# Patient Record
Sex: Female | Born: 1985 | Hispanic: Yes | Marital: Married | Smoking: Never smoker
Health system: Southern US, Community
[De-identification: ages and names within clinical notes are randomized; demographics above are authoritative.]

## PROBLEM LIST (undated history)

## (undated) ENCOUNTER — Inpatient Hospital Stay (HOSPITAL_COMMUNITY): Payer: Self-pay

## (undated) DIAGNOSIS — R51 Headache: Secondary | ICD-10-CM

## (undated) DIAGNOSIS — K219 Gastro-esophageal reflux disease without esophagitis: Secondary | ICD-10-CM

## (undated) DIAGNOSIS — N809 Endometriosis, unspecified: Secondary | ICD-10-CM

## (undated) DIAGNOSIS — IMO0001 Reserved for inherently not codable concepts without codable children: Secondary | ICD-10-CM

## (undated) DIAGNOSIS — Z862 Personal history of diseases of the blood and blood-forming organs and certain disorders involving the immune mechanism: Secondary | ICD-10-CM

## (undated) DIAGNOSIS — R112 Nausea with vomiting, unspecified: Secondary | ICD-10-CM

## (undated) DIAGNOSIS — F419 Anxiety disorder, unspecified: Secondary | ICD-10-CM

## (undated) DIAGNOSIS — Z9889 Other specified postprocedural states: Secondary | ICD-10-CM

## (undated) DIAGNOSIS — K5900 Constipation, unspecified: Secondary | ICD-10-CM

## (undated) DIAGNOSIS — D759 Disease of blood and blood-forming organs, unspecified: Secondary | ICD-10-CM

## (undated) DIAGNOSIS — K2970 Gastritis, unspecified, without bleeding: Secondary | ICD-10-CM

## (undated) DIAGNOSIS — N390 Urinary tract infection, site not specified: Secondary | ICD-10-CM

## (undated) DIAGNOSIS — T4145XA Adverse effect of unspecified anesthetic, initial encounter: Secondary | ICD-10-CM

## (undated) DIAGNOSIS — J45909 Unspecified asthma, uncomplicated: Secondary | ICD-10-CM

## (undated) DIAGNOSIS — T8859XA Other complications of anesthesia, initial encounter: Secondary | ICD-10-CM

## (undated) DIAGNOSIS — D219 Benign neoplasm of connective and other soft tissue, unspecified: Secondary | ICD-10-CM

## (undated) HISTORY — PX: INDUCED ABORTION: SHX677

## (undated) HISTORY — PX: DILATION AND CURETTAGE OF UTERUS: SHX78

---

## 2006-12-08 ENCOUNTER — Emergency Department (HOSPITAL_COMMUNITY): Admission: EM | Admit: 2006-12-08 | Discharge: 2006-12-08 | Payer: Self-pay | Admitting: Emergency Medicine

## 2010-11-21 LAB — I-STAT 8, (EC8 V) (CONVERTED LAB)
Acid-Base Excess: 2
BUN: 8
Bicarbonate: 26.6 — ABNORMAL HIGH
Chloride: 104
Glucose, Bld: 110 — ABNORMAL HIGH
HCT: 43
Hemoglobin: 14.6
Operator id: 288831
Potassium: 4.3
Sodium: 138
TCO2: 28
pCO2, Ven: 41.5 — ABNORMAL LOW
pH, Ven: 7.414 — ABNORMAL HIGH

## 2010-11-21 LAB — DIFFERENTIAL
Basophils Absolute: 0.1
Basophils Relative: 1
Lymphocytes Relative: 7 — ABNORMAL LOW
Neutro Abs: 15.9 — ABNORMAL HIGH
Neutrophils Relative %: 90 — ABNORMAL HIGH

## 2010-11-21 LAB — CBC
MCV: 90.4
Platelets: 280
RBC: 4.53
RDW: 12

## 2010-11-21 LAB — D-DIMER, QUANTITATIVE: D-Dimer, Quant: <0.22

## 2010-11-21 LAB — POCT I-STAT CREATININE
Creatinine, Ser: 0.6
Operator id: 288831

## 2011-12-10 ENCOUNTER — Emergency Department (HOSPITAL_BASED_OUTPATIENT_CLINIC_OR_DEPARTMENT_OTHER)
Admission: EM | Admit: 2011-12-10 | Discharge: 2011-12-11 | Disposition: A | Discharge status: Home / Self Care | Payer: Self-pay | Attending: Emergency Medicine | Admitting: Emergency Medicine

## 2011-12-10 ENCOUNTER — Encounter (HOSPITAL_BASED_OUTPATIENT_CLINIC_OR_DEPARTMENT_OTHER): Payer: Self-pay | Admitting: *Deleted

## 2011-12-10 ENCOUNTER — Emergency Department (HOSPITAL_BASED_OUTPATIENT_CLINIC_OR_DEPARTMENT_OTHER): Payer: Self-pay

## 2011-12-10 DIAGNOSIS — K2970 Gastritis, unspecified, without bleeding: Secondary | ICD-10-CM | POA: Insufficient documentation

## 2011-12-10 DIAGNOSIS — R21 Rash and other nonspecific skin eruption: Secondary | ICD-10-CM | POA: Insufficient documentation

## 2011-12-10 DIAGNOSIS — K859 Acute pancreatitis without necrosis or infection, unspecified: Secondary | ICD-10-CM | POA: Insufficient documentation

## 2011-12-10 DIAGNOSIS — R197 Diarrhea, unspecified: Secondary | ICD-10-CM | POA: Insufficient documentation

## 2011-12-10 DIAGNOSIS — Z87440 Personal history of urinary (tract) infections: Secondary | ICD-10-CM | POA: Insufficient documentation

## 2011-12-10 DIAGNOSIS — F411 Generalized anxiety disorder: Secondary | ICD-10-CM | POA: Insufficient documentation

## 2011-12-10 DIAGNOSIS — K219 Gastro-esophageal reflux disease without esophagitis: Secondary | ICD-10-CM | POA: Insufficient documentation

## 2011-12-10 DIAGNOSIS — R110 Nausea: Secondary | ICD-10-CM | POA: Insufficient documentation

## 2011-12-10 DIAGNOSIS — K5900 Constipation, unspecified: Secondary | ICD-10-CM | POA: Insufficient documentation

## 2011-12-10 HISTORY — DX: Reserved for inherently not codable concepts without codable children: IMO0001

## 2011-12-10 HISTORY — DX: Constipation, unspecified: K59.00

## 2011-12-10 HISTORY — DX: Gastro-esophageal reflux disease without esophagitis: K21.9

## 2011-12-10 HISTORY — DX: Gastritis, unspecified, without bleeding: K29.70

## 2011-12-10 HISTORY — DX: Anxiety disorder, unspecified: F41.9

## 2011-12-10 HISTORY — DX: Urinary tract infection, site not specified: N39.0

## 2011-12-10 LAB — CBC WITH DIFFERENTIAL/PLATELET
Basophils Absolute: 0 10*3/uL (ref 0.0–0.1)
Basophils Relative: 0 % (ref 0–1)
Eosinophils Absolute: 0.3 10*3/uL (ref 0.0–0.7)
Eosinophils Relative: 3 % (ref 0–5)
HCT: 39.6 % (ref 36.0–46.0)
Hemoglobin: 13.6 g/dL (ref 12.0–15.0)
MCV: 90.4 fL (ref 78.0–100.0)
RBC: 4.38 MIL/uL (ref 3.87–5.11)

## 2011-12-10 LAB — URINALYSIS, ROUTINE W REFLEX MICROSCOPIC
Glucose, UA: NEGATIVE mg/dL
Hgb urine dipstick: NEGATIVE
Leukocytes, UA: NEGATIVE
Nitrite: NEGATIVE
Protein, ur: NEGATIVE mg/dL
Specific Gravity, Urine: 1.023 (ref 1.005–1.030)
Urobilinogen, UA: 0.2 mg/dL (ref 0.0–1.0)

## 2011-12-10 LAB — COMPREHENSIVE METABOLIC PANEL
AST: 17 U/L (ref 0–37)
Albumin: 4 g/dL (ref 3.5–5.2)
Chloride: 100 mEq/L (ref 96–112)
GFR calc Af Amer: >90 mL/min (ref >90)
Total Bilirubin: 0.4 mg/dL (ref 0.3–1.2)

## 2011-12-10 LAB — PREGNANCY, URINE: Preg Test, Ur: NEGATIVE

## 2011-12-10 LAB — LIPASE, BLOOD: Lipase: 195 U/L — ABNORMAL HIGH (ref 11–59)

## 2011-12-10 MED ORDER — ONDANSETRON HCL 4 MG/2ML IJ SOLN
4.0000 mg | Freq: Once | INTRAMUSCULAR | Status: AC
  Administered 2011-12-10: 4 mg via INTRAVENOUS
  Filled 2011-12-10: qty 2

## 2011-12-10 MED ORDER — SODIUM CHLORIDE 0.9 % IV BOLUS (SEPSIS)
1000.0000 mL | Freq: Once | INTRAVENOUS | Status: AC
  Administered 2011-12-10: 1000 mL via INTRAVENOUS

## 2011-12-10 MED ORDER — HYDROMORPHONE HCL PF 1 MG/ML IJ SOLN
1.0000 mg | Freq: Once | INTRAMUSCULAR | Status: DC
  Filled 2011-12-10: qty 1

## 2011-12-10 NOTE — ED Provider Notes (Addendum)
History     CSN: [id number]  Arrival date & time 12/10/11  1731   First MD Initiated Contact with Patient 12/10/11 1857      Chief Complaint  Patient presents with  . Abdominal Pain    (Consider location/radiation/quality/duration/timing/severity/associated sxs/prior treatment) HPI Patient complaining of intermittent abdominal pain which became constant within the past 24 hours. She describes it as in the epigastric region radiating to the back and to the right side. She had some weight loss with this in the spring. She states that she has had it for a total of about 9-12 months. She denies recent weight loss. She has not had vomiting with this although it has sometimes increased her pain to eat. She does not drink alcohol. She has no history of gallstones. No fever, chills, dyspnea, UTI symptoms, hematuria, or history of STD. Patient states that she had some type of stomach problem as an infant that included constipation and has had intermittent episodes of constipation. She denies any current problems with constipation or diarrhea. She states that she had gastritis which she received IV medications for several years ago. Past Medical History  Diagnosis Date  . Reflux   . UTI (lower urinary tract infection)   . Anxiety   . Constipation   . Gastritis     History reviewed. No pertinent past surgical history.  No family history on file.  History  Substance Use Topics  . Smoking status: Never Smoker   . Smokeless tobacco: Not on file  . Alcohol Use: No    OB History    Grav Para Term Preterm Abortions TAB SAB Ect Mult Living                  Review of Systems  Constitutional: Negative for fever and unexpected weight change.  Eyes: Negative for discharge.  Respiratory: Negative for cough, choking and shortness of breath.   Cardiovascular: Negative for chest pain.  Gastrointestinal: Positive for abdominal pain. Negative for nausea, vomiting, diarrhea, constipation,  abdominal distention, anal bleeding and rectal pain.  Genitourinary: Positive for flank pain. Negative for dysuria, urgency, frequency, hematuria, decreased urine volume, vaginal bleeding, vaginal discharge, difficulty urinating, genital sores, vaginal pain, menstrual problem and pelvic pain.  Musculoskeletal: Negative for arthralgias and gait problem.  Skin: Positive for rash.  Neurological: Negative for dizziness.  Hematological: Negative for adenopathy.  Psychiatric/Behavioral: Negative for behavioral problems and confusion.    Allergies  Review of patient's allergies indicates no known allergies.  Home Medications  No current outpatient prescriptions on file.  BP 112/74  Pulse 73  Temp 97.1 F (36.2 C) (Oral)  Resp 20  Ht 5\' 4"  (1.626 m)  Wt 138 lb (62.596 kg)  BMI 23.69 kg/m2  SpO2 100%  LMP 11/17/2011  Physical Exam  Nursing note and vitals reviewed. Constitutional: She appears well-developed and well-nourished.  HENT:  Head: Normocephalic and atraumatic.  Eyes: Conjunctivae normal and EOM are normal. Pupils are equal, round, and reactive to light.  Neck: Normal range of motion. Neck supple.  Cardiovascular: Normal rate, regular rhythm, normal heart sounds and intact distal pulses.   Pulmonary/Chest: Effort normal and breath sounds normal.  Abdominal: Soft. Bowel sounds are normal. There is tenderness.       Mild epigastric tenderness with palpation.  Musculoskeletal: Normal range of motion.  Neurological: She is alert.  Skin: Skin is warm and dry.  Psychiatric: She has a normal mood and affect. Thought content normal.    ED Course  Procedures (including critical care time)  Labs Reviewed  LIPASE, BLOOD - Abnormal; Notable for the following:    Lipase 195 (*)     All other components within normal limits  URINALYSIS, ROUTINE W REFLEX MICROSCOPIC  PREGNANCY, URINE  CBC WITH DIFFERENTIAL  COMPREHENSIVE METABOLIC PANEL   US Abdomen Complete  12/10/2011   *RADIOLOGY REPORT*  Clinical Data:  Abdominal pain.  Nausea.  COMPLETE ABDOMINAL ULTRASOUND  Comparison:  None.  Findings:  Gallbladder:  No shadowing gallstones or echogenic sludge.  No gallbladder wall thickening or pericholecystic fluid.  No sonographic Murphy's sign according to the ultrasound technologist. Wall thickness is 1.8 mm, within normal limits.  Common bile duct:  Normal in caliber. No biliary ductal dilation. The maximal diameters 2.0 mm, within normal limits.  Liver:  No focal lesion identified.  Within normal limits in parenchymal echogenicity.  IVC:  Limited visualization due to bowel gas.  Pancreas:  Limited visualization due to bowel gas.  Spleen:  Normal size and echotexture without focal parenchymal abnormality.  The maximal length is 7.4 cm, within normal limits.  Right Kidney:  No hydronephrosis.  Well-preserved cortex.  Normal size and parenchymal echotexture without focal abnormalities. The maximal length is 10.0 cm, within normal limits.  Left Kidney:  No hydronephrosis.  Well-preserved cortex.  Normal size and parenchymal echotexture without focal abnormalities. The maximal length is 12.0 cm, within normal limits.  Abdominal aorta:  Limited visualization due to bowel gas.  IMPRESSION: Negative abdominal ultrasound.   Original Report Authenticated By: Jamesetta Orleans. MATTERN, M.D.      No diagnosis found.    Patient with elevated lipase and epigastric pain consistent with pancreatitis. She does not drink alcohol and does not have any gallstones noted on ultrasound. She has had this over several months up to one year. The pain had increased over the past several days but has dissipated here with pain medicine. She has been taking by mouth without difficulty. She's given by mouth here but has not caused her pain. She is tolerating oral fluids. I am currently consult saying gastroenterology to assure close followup.       Hilario Quarry, MD 12/10/11 2352  Patient's care  discussed with Dr. Loreta Ave. Unfortunately Dr. Loreta Ave is not on call for [facility]. However, the patient's care was discussed with her and she will contact [facility] to arrange followup in the morning. She voiced agreement that the patient should have followup tomorrow given her elevated lipase. She was given the phone number to access the patient had [phone]. She asked that the patient be instructed to call  gastroenterology at [phone] if she has not contacted. This is discussed with the patient her family and she and the family voiced understanding and agreement with this plan.  Hilario Quarry, MD 12/13/11 [phone]

## 2011-12-10 NOTE — ED Notes (Addendum)
Patient states she has had generalized abdominal pain for the last 2 days.  States pain has been associated with nausea, and is having diarrhea 1 time per day.  States milk or yogurt will relieve the pain.  Today she had been very tired.

## 2011-12-10 NOTE — ED Notes (Signed)
Pt requesting to have something to eat. Informed pt that she needed to wait til Ultrasound results were complete and the EDP was able to review it.

## 2011-12-11 MED ORDER — ONDANSETRON 8 MG PO TBDP: 8.0000 mg | ORAL_TABLET | Freq: Three times a day (TID) | ORAL | Status: DC | PRN

## 2011-12-11 MED ORDER — OXYCODONE-ACETAMINOPHEN 5-325 MG PO TABS: 1.0000 | ORAL_TABLET | ORAL | Status: AC | PRN

## 2011-12-19 ENCOUNTER — Encounter (HOSPITAL_COMMUNITY): Payer: Self-pay | Admitting: *Deleted

## 2011-12-19 ENCOUNTER — Emergency Department (HOSPITAL_COMMUNITY): Payer: No Typology Code available for payment source

## 2011-12-19 ENCOUNTER — Emergency Department (HOSPITAL_COMMUNITY)
Admission: EM | Admit: 2011-12-19 | Discharge: 2011-12-19 | Disposition: A | Discharge status: Home / Self Care | Payer: No Typology Code available for payment source | Attending: Emergency Medicine | Admitting: Emergency Medicine

## 2011-12-19 DIAGNOSIS — Y9389 Activity, other specified: Secondary | ICD-10-CM | POA: Insufficient documentation

## 2011-12-19 DIAGNOSIS — Z87440 Personal history of urinary (tract) infections: Secondary | ICD-10-CM | POA: Insufficient documentation

## 2011-12-19 DIAGNOSIS — S161XXA Strain of muscle, fascia and tendon at neck level, initial encounter: Secondary | ICD-10-CM

## 2011-12-19 DIAGNOSIS — K219 Gastro-esophageal reflux disease without esophagitis: Secondary | ICD-10-CM | POA: Insufficient documentation

## 2011-12-19 DIAGNOSIS — M545 Low back pain, unspecified: Secondary | ICD-10-CM | POA: Insufficient documentation

## 2011-12-19 DIAGNOSIS — Z8719 Personal history of other diseases of the digestive system: Secondary | ICD-10-CM | POA: Insufficient documentation

## 2011-12-19 DIAGNOSIS — S139XXA Sprain of joints and ligaments of unspecified parts of neck, initial encounter: Secondary | ICD-10-CM | POA: Insufficient documentation

## 2011-12-19 DIAGNOSIS — F411 Generalized anxiety disorder: Secondary | ICD-10-CM | POA: Insufficient documentation

## 2011-12-19 MED ORDER — OXYCODONE-ACETAMINOPHEN 5-325 MG PO TABS
2.0000 | ORAL_TABLET | Freq: Once | ORAL | Status: AC
  Administered 2011-12-19: 2 via ORAL
  Filled 2011-12-19: qty 2

## 2011-12-19 MED ORDER — IBUPROFEN 200 MG PO TABS
600.0000 mg | ORAL_TABLET | Freq: Once | ORAL | Status: AC
Start: 2011-12-19 — End: 2011-12-19
  Administered 2011-12-19: 600 mg via ORAL
  Filled 2011-12-19: qty 3

## 2011-12-19 NOTE — ED Notes (Signed)
Attempted to give pain medication, however pt's brother asked this RN to wait because he is helping her urinate. Pt was using female urinal which was given to her by her brother. This RN asked patient to use her call button and call for help next time. Also bedpan was given to pt's brother who refused my help and was assisting pt to use bedpan incorrectly. Pt was given tissue to clean herself, however her brother stated "she can't do it herself" and he proceeded to clean up the pt.

## 2011-12-19 NOTE — ED Notes (Addendum)
Attempted to give pain medication again, but pt's family at the bedside stated pt is not taking any medications by mouth d/t "sensitive stomach" and are asking for IV meds. Dr Juleen China notified, no new orders recieved
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
## 2011-12-19 NOTE — Progress Notes (Signed)
pt confirmed pcp is dr Luiz Iron EP[facility] updated
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
## 2012-05-14 ENCOUNTER — Encounter: Payer: Self-pay | Admitting: Obstetrics & Gynecology

## 2012-05-14 ENCOUNTER — Ambulatory Visit (INDEPENDENT_AMBULATORY_CARE_PROVIDER_SITE_OTHER): Payer: Self-pay | Admitting: Obstetrics & Gynecology

## 2012-05-14 VITALS — BP 120/72 | HR 81 | Ht 64.0 in | Wt 152.0 lb

## 2012-05-14 DIAGNOSIS — N926 Irregular menstruation, unspecified: Secondary | ICD-10-CM

## 2012-05-14 DIAGNOSIS — N930 Postcoital and contact bleeding: Secondary | ICD-10-CM

## 2012-05-14 DIAGNOSIS — N86 Erosion and ectropion of cervix uteri: Secondary | ICD-10-CM | POA: Insufficient documentation

## 2012-05-14 NOTE — Progress Notes (Signed)
Got married March 1st and she was a virgin,  She has been bleeding now for two weeks, she has pain deeper inside than on entrance.  Once he eases in the pain gets better but she is still bleeding.  She did have pain prior to intercourse  Especially with her cycles that included swelling, bloating leg and back pain and colicky like pains with very heavy bleeding.

## 2012-05-14 NOTE — Patient Instructions (Addendum)
Return to clinic for any gynecologic concerns or go to MAU for evaluation

## 2012-05-14 NOTE — Progress Notes (Signed)
GYNECOLOGY CLINIC PROGRESS NOTE  History:  27 y.o. G0 here today for evaluation of post-coital bleeding and irregular uterine bleeding.  Had irregular periods in the past ans has been on OCPs.  Recently got married and had intercourse for the first time; this was characterized by significant bleeding and pain.  She had intercourse two more times and noted more bleeding.  Also noted that she has abnormal uterine bleeding.  She was evaluated in outside institution, had normal labs, negative GC/Chlam and wet prep and normal pelvic ultrasound.  [facility], had significant bleeding after the pap, results pending.  Patient is very concerned that something is wrong.   The following portions of the patient's history were reviewed and updated as appropriate: allergies, current medications, past family history, past medical history, past social history, past surgical history and problem list. Pap smeaar pending.  Review of Systems:  Pertinent items are noted in HPI.  Objective:  Physical Exam BP 120/72  Pulse 81  Ht 5\' 4"  (1.626 m)  Wt 152 lb (68.947 kg)  BMI 26.08 kg/m2 Gen: NAD Abd: Soft, nontender and nondistended Pelvic: Normal appearing external genitalia; normal appearing vaginal mucosa.  Cervix with friable ectropion noted, bled easily on palpation with cotton swab.  Small amount of uterine bleeding noted.  No lesions seen. Small amount of bloody discharge.  Small uterus, no other palpable masses, no uterine or adnexal tenderness  Assessment & Plan:  Friable ectropion seen on exam, also has irregular bleeding. Will do OCP taper for the irregular bleeding, recommended cryotherapy to treat the ectropion.  She will return for the procedure.

## 2012-05-27 ENCOUNTER — Encounter: Payer: Self-pay | Admitting: Obstetrics & Gynecology

## 2012-07-09 ENCOUNTER — Encounter: Payer: Self-pay | Admitting: Obstetrics & Gynecology

## 2012-09-16 ENCOUNTER — Inpatient Hospital Stay (HOSPITAL_COMMUNITY): Payer: Medicaid Other

## 2012-09-16 ENCOUNTER — Inpatient Hospital Stay (HOSPITAL_COMMUNITY)
Admission: AD | Admit: 2012-09-16 | Discharge: 2012-09-16 | Disposition: A | Discharge status: Home / Self Care | Payer: Medicaid Other | Source: Ambulatory Visit | Attending: Obstetrics & Gynecology | Admitting: Obstetrics & Gynecology

## 2012-09-16 ENCOUNTER — Encounter (HOSPITAL_COMMUNITY): Payer: Self-pay | Admitting: Medical

## 2012-09-16 DIAGNOSIS — O209 Hemorrhage in early pregnancy, unspecified: Secondary | ICD-10-CM | POA: Insufficient documentation

## 2012-09-16 DIAGNOSIS — O4690 Antepartum hemorrhage, unspecified, unspecified trimester: Secondary | ICD-10-CM

## 2012-09-16 DIAGNOSIS — O26899 Other specified pregnancy related conditions, unspecified trimester: Secondary | ICD-10-CM

## 2012-09-16 DIAGNOSIS — N831 Corpus luteum cyst of ovary, unspecified side: Secondary | ICD-10-CM | POA: Insufficient documentation

## 2012-09-16 DIAGNOSIS — O34599 Maternal care for other abnormalities of gravid uterus, unspecified trimester: Secondary | ICD-10-CM | POA: Insufficient documentation

## 2012-09-16 DIAGNOSIS — R109 Unspecified abdominal pain: Secondary | ICD-10-CM | POA: Insufficient documentation

## 2012-09-16 LAB — URINALYSIS, ROUTINE W REFLEX MICROSCOPIC
Bilirubin Urine: NEGATIVE
Glucose, UA: NEGATIVE mg/dL
Ketones, ur: NEGATIVE mg/dL
Leukocytes, UA: NEGATIVE
Specific Gravity, Urine: <1.005 — ABNORMAL LOW (ref 1.005–1.030)

## 2012-09-16 LAB — WET PREP, GENITAL: Yeast Wet Prep HPF POC: NONE SEEN

## 2012-09-16 LAB — URINE MICROSCOPIC-ADD ON

## 2012-09-16 LAB — POCT PREGNANCY, URINE: Preg Test, Ur: POSITIVE — AB

## 2012-09-16 LAB — CBC
HCT: 39.4 % (ref 36.0–46.0)
MCV: 87.9 fL (ref 78.0–100.0)
RDW: 12.5 % (ref 11.5–15.5)
WBC: 8.5 10*3/uL (ref 4.0–10.5)

## 2012-09-16 MED ORDER — OXYCODONE-ACETAMINOPHEN 5-325 MG PO TABS: 1.0000 | ORAL_TABLET | ORAL | Status: DC | PRN

## 2012-09-16 NOTE — MAU Note (Signed)
Found out yesterday is preg (2HPT) tired.  Past 4 days has been having pain in  Lower abd, rt flank area and into rt leg.

## 2012-09-16 NOTE — MAU Provider Note (Signed)
History     CSN: [id number]  Arrival date and time: 09/16/12 1628   None     Chief Complaint  Patient presents with  . Abdominal Pain   HPI [name] is a 27 y.o. G1P0 at [redacted]w[redacted]d who presents to MAU today with complaint of lower abdominal pain since last night and 2 episodes of spotting in the last week. Patient had +HPT recently. She rates pain at 4/10 now and 10/10 at the worst last night. She has had nausea without vomiting or diarrhea. She has had some constipation, but reports normal BM earlier today. She has headache, dizziness, fatigue and DOE.   OB History   Grav Para Term Preterm Abortions TAB SAB Ect Mult Living   1               Past Medical History  Diagnosis Date  . Reflux   . UTI (lower urinary tract infection)   . Anxiety   . Constipation   . Gastritis     No past surgical history on file.  No family history on file.  History  Substance Use Topics  . Smoking status: Never Smoker   . Smokeless tobacco: Not on file  . Alcohol Use: No    Allergies: No Known Allergies  Prescriptions prior to admission  Medication Sig Dispense Refill  . CALCIUM PO Take 400 Units by mouth daily.      Marland Kitchen FOLIC ACID PO Take 1 tablet by mouth daily.      . Multiple Vitamin (MULTIVITAMIN WITH MINERALS) TABS tablet Take 1 tablet by mouth daily.        Review of Systems  Constitutional: Positive for malaise/fatigue. Negative for fever.  Gastrointestinal: Positive for nausea and abdominal pain. Negative for vomiting, diarrhea and constipation.  Genitourinary: Negative for dysuria, urgency and frequency.       + Vaginal bleeding, discharge  Neurological: Positive for dizziness and headaches. Negative for loss of consciousness and weakness.   Physical Exam   Blood pressure 122/63, pulse 75, temperature 98.4 F (36.9 C), temperature source Oral, resp. rate 18, height 5' 3.5" (1.613 m), weight 162 lb (73.483 kg), last menstrual period 08/15/2012.  Physical Exam   Constitutional: She is oriented to person, place, and time. She appears well-developed and well-nourished. No distress.  HENT:  Head: Normocephalic and atraumatic.  Cardiovascular: Normal rate, regular rhythm and normal heart sounds.   Respiratory: Effort normal and breath sounds normal. No respiratory distress.  GI: Soft. Bowel sounds are normal. She exhibits no distension and no mass. There is tenderness (mild lower abdominal tenderness to palpation at the midline). There is no rebound, no guarding and no CVA tenderness.  Genitourinary: Uterus is not enlarged and not tender. Cervix exhibits no motion tenderness, no discharge and no friability. Right adnexum displays no mass and no tenderness. Left adnexum displays no mass and no tenderness. There is bleeding (scant amount of dark blood noted) around the vagina. Vaginal discharge (small amount of thick, white-brown discharge noted) found.  Neurological: She is alert and oriented to person, place, and time.  Skin: Skin is warm and dry. No erythema.  Psychiatric: She has a normal mood and affect.   Results for orders placed during the hospital encounter of 09/16/12 (from the past 24 hour(s))  URINALYSIS, ROUTINE W REFLEX MICROSCOPIC     Status: Abnormal   Collection Time    09/16/12  4:50 PM      Result Value Range   Color, Urine  YELLOW  YELLOW   APPearance CLEAR  CLEAR   Specific Gravity, Urine <1.005 (*) 1.005 - 1.030   pH 7.0  5.0 - 8.0   Glucose, UA NEGATIVE  NEGATIVE mg/dL   Hgb urine dipstick TRACE (*) NEGATIVE   Bilirubin Urine NEGATIVE  NEGATIVE   Ketones, ur NEGATIVE  NEGATIVE mg/dL   Protein, ur NEGATIVE  NEGATIVE mg/dL   Urobilinogen, UA 0.2  0.0 - 1.0 mg/dL   Nitrite NEGATIVE  NEGATIVE   Leukocytes, UA NEGATIVE  NEGATIVE  URINE MICROSCOPIC-ADD ON     Status: Abnormal   Collection Time    09/16/12  4:50 PM      Result Value Range   Squamous Epithelial / LPF FEW (*) RARE   WBC, UA 0-2  <3 WBC/hpf   Bacteria, UA RARE  RARE    Urine-Other AMORPHOUS URATES/PHOSPHATES    POCT PREGNANCY, URINE     Status: Abnormal   Collection Time    09/16/12  4:56 PM      Result Value Range   Preg Test, Ur POSITIVE (*) NEGATIVE  CBC     Status: None   Collection Time    09/16/12  5:15 PM      Result Value Range   WBC 8.5  4.0 - 10.5 K/uL   RBC 4.48  3.87 - 5.11 MIL/uL   Hemoglobin 13.7  12.0 - 15.0 g/dL   HCT 16.1  09.6 - 04.5 %   MCV 87.9  78.0 - 100.0 fL   MCH 30.6  26.0 - 34.0 pg   MCHC 34.8  30.0 - 36.0 g/dL   RDW 40.9  81.1 - 91.4 %   Platelets 290  150 - 400 K/uL  ABO/RH     Status: None   Collection Time    09/16/12  5:15 PM      Result Value Range   ABO/RH(D) A POS    HCG, QUANTITATIVE, PREGNANCY     Status: Abnormal   Collection Time    09/16/12  5:15 PM      Result Value Range   hCG, Beta Chain, Quant, S 337 (*) <5 mIU/mL  WET PREP, GENITAL     Status: Abnormal   Collection Time    09/16/12  7:06 PM      Result Value Range   Yeast Wet Prep HPF POC NONE SEEN  NONE SEEN   Trich, Wet Prep NONE SEEN  NONE SEEN   Clue Cells Wet Prep HPF POC NONE SEEN  NONE SEEN   WBC, Wet Prep HPF POC FEW (*) NONE SEEN   US Ob Comp Less 14 Wks  09/16/2012   *RADIOLOGY REPORT*  Clinical Data: Pregnant, beta HCG 337, right flank pain  OBSTETRIC <14 WK [place] AND TRANSVAGINAL OB US  Technique:  Both transabdominal and transvaginal ultrasound examinations were performed for complete evaluation of the gestation as well as the maternal uterus, adnexal regions, and pelvic cul-de-sac.  Transvaginal technique was performed to assess early pregnancy.  Comparison:  None.  Intrauterine gestational sac:  Not visualized.  Maternal uterus/adnexae: Endometrial complex measures up to 1.9 cm.  Left ovary is within normal limits, measuring 1.0 x 2.5 x 1.8 cm.  Right ovary measures 2.3 x 4.0 x 2.4 cm and is notable for a 1.4 cm corpus luteal cyst.  No free fluid is seen.  IMPRESSION: No IUP is visualized.  This is not unexpected given the low beta  HCG.  By definition, this reflects a pregnancy of  unknown location. Primary differential considerations include early normal IUP, abnormal IUP, or nonvisualized ectopic pregnancy.  Serial beta HCG is suggested, supplemented by follow-up pelvic ultrasound in 14 days (or earlier as clinically warranted).  Right corpus luteal cyst.   Original Report Authenticated By: Charline Bills, M.D.   US Ob Transvaginal  09/16/2012   *RADIOLOGY REPORT*  Clinical Data: Pregnant, beta HCG 337, right flank pain  OBSTETRIC <14 WK [place] AND TRANSVAGINAL OB US  Technique:  Both transabdominal and transvaginal ultrasound examinations were performed for complete evaluation of the gestation as well as the maternal uterus, adnexal regions, and pelvic cul-de-sac.  Transvaginal technique was performed to assess early pregnancy.  Comparison:  None.  Intrauterine gestational sac:  Not visualized.  Maternal uterus/adnexae: Endometrial complex measures up to 1.9 cm.  Left ovary is within normal limits, measuring 1.0 x 2.5 x 1.8 cm.  Right ovary measures 2.3 x 4.0 x 2.4 cm and is notable for a 1.4 cm corpus luteal cyst.  No free fluid is seen.  IMPRESSION: No IUP is visualized.  This is not unexpected given the low beta HCG.  By definition, this reflects a pregnancy of unknown location. Primary differential considerations include early normal IUP, abnormal IUP, or nonvisualized ectopic pregnancy.  Serial beta HCG is suggested, supplemented by follow-up pelvic ultrasound in 14 days (or earlier as clinically warranted).  Right corpus luteal cyst.   Original Report Authenticated By: Charline Bills, M.D.    MAU Course  Procedures None  MDM UPT - positive CBC, ABO/Rh, quant hCG, Wet prep, GC/Chlamydia, UA and [place] today Patient indicated distribution of pain from suprapubic region around to flank. Only trace Hgb in urine and h/o vaginal bleeding. Will send Rx for Percocet for pain and have patient return if pain worsens as this could  indicate nephrolithiasis. WBC - WNL, patient afebrile.  Assessment and Plan  A: Vaginal bleeding in pregnancy Abdominal pain in pregnancy  P: Discharge home Rx for Percocet given to the patient First trimester warning signs reviewed Patient will return to MAU in 48 hours for follow-up labs Patient may return to MAU as needed sooner  Freddi Starr, PA-C  09/16/2012, 7:32 PM

## 2012-09-18 ENCOUNTER — Encounter (HOSPITAL_COMMUNITY): Payer: Self-pay | Admitting: *Deleted

## 2012-09-18 ENCOUNTER — Inpatient Hospital Stay (HOSPITAL_COMMUNITY)
Admission: AD | Admit: 2012-09-18 | Discharge: 2012-09-18 | Disposition: A | Discharge status: Home / Self Care | Payer: Medicaid Other | Source: Ambulatory Visit | Attending: Obstetrics and Gynecology | Admitting: Obstetrics and Gynecology

## 2012-09-18 DIAGNOSIS — N949 Unspecified condition associated with female genital organs and menstrual cycle: Secondary | ICD-10-CM | POA: Insufficient documentation

## 2012-09-18 DIAGNOSIS — O9989 Other specified diseases and conditions complicating pregnancy, childbirth and the puerperium: Secondary | ICD-10-CM

## 2012-09-18 DIAGNOSIS — O99891 Other specified diseases and conditions complicating pregnancy: Secondary | ICD-10-CM | POA: Insufficient documentation

## 2012-09-18 DIAGNOSIS — O26891 Other specified pregnancy related conditions, first trimester: Secondary | ICD-10-CM

## 2012-09-18 DIAGNOSIS — R109 Unspecified abdominal pain: Secondary | ICD-10-CM | POA: Insufficient documentation

## 2012-09-18 LAB — URINALYSIS, ROUTINE W REFLEX MICROSCOPIC
Glucose, UA: NEGATIVE mg/dL
Hgb urine dipstick: NEGATIVE
Specific Gravity, Urine: <1.005 — ABNORMAL LOW (ref 1.005–1.030)
pH: 6 (ref 5.0–8.0)

## 2012-09-18 LAB — HCG, QUANTITATIVE, PREGNANCY: hCG, Beta Chain, Quant, S: 605 m[IU]/mL — ABNORMAL HIGH (ref <5)

## 2012-09-18 NOTE — MAU Provider Note (Signed)
  History     CSN: [id number]  Arrival date and time: 09/18/12 1304   None     Chief Complaint  Patient presents with  . Abdominal Pain   HPI Monica Phelps is 27 y.o. G1P0 [redacted]w[redacted]d weeks presenting for repeat BHCG.  Was initially seen 8/6 in MAU for lower abdominal pain, spotting and a +HPT.  Blood type A+. BHCG 337, and U/S showed No IUP.  Pain is similar to two days ago.  Intermittent in nature.  No vaginal bleeding.    Past Medical History  Diagnosis Date  . Reflux   . UTI (lower urinary tract infection)   . Anxiety   . Constipation   . Gastritis     History reviewed. No pertinent past surgical history.  History reviewed. No pertinent family history.  History  Substance Use Topics  . Smoking status: Never Smoker   . Smokeless tobacco: Not on file  . Alcohol Use: No    Allergies: No Known Allergies  Prescriptions prior to admission  Medication Sig Dispense Refill  . CALCIUM PO Take 400 Units by mouth daily.      Marland Kitchen FOLIC ACID PO Take 1 tablet by mouth daily.      . Multiple Vitamin (MULTIVITAMIN WITH MINERALS) TABS tablet Take 1 tablet by mouth daily.      Marland Kitchen oxyCODONE-acetaminophen (PERCOCET/ROXICET) 5-325 MG per tablet Take 1-2 tablets by mouth every 4 (four) hours as needed for pain.  12 tablet  0    Review of Systems  Gastrointestinal: Positive for abdominal pain (pelvic pain).   Physical Exam   Blood pressure 127/72, pulse 72, temperature 98.5 F (36.9 C), temperature source Oral, resp. rate 18, height 5' 3.5" (1.613 m), weight 160 lb (72.576 kg), last menstrual period 08/15/2012.  Physical Exam  Constitutional: She is oriented to person, place, and time. She appears well-developed and well-nourished. No distress.  HENT:  Head: Normocephalic.  Neck: Normal range of motion. Neck supple.  Neurological: She is alert and oriented to person, place, and time. She has normal reflexes.  Skin: Skin is warm and dry.    MAU Course  Procedures  MDM  Care turned  over to W. Leighton Ruff M 09/18/2012, 3:13 PM   Results for orders placed during the hospital encounter of 09/18/12 (from the past 24 hour(s))  URINALYSIS, ROUTINE W REFLEX MICROSCOPIC     Status: Abnormal   Collection Time    09/18/12  1:35 PM      Result Value Range   Color, Urine YELLOW  YELLOW   APPearance CLEAR  CLEAR   Specific Gravity, Urine <1.005 (*) 1.005 - 1.030   pH 6.0  5.0 - 8.0   Glucose, UA NEGATIVE  NEGATIVE mg/dL   Hgb urine dipstick NEGATIVE  NEGATIVE   Bilirubin Urine NEGATIVE  NEGATIVE   Ketones, ur NEGATIVE  NEGATIVE mg/dL   Protein, ur NEGATIVE  NEGATIVE mg/dL   Urobilinogen, UA 0.2  0.0 - 1.0 mg/dL   Nitrite NEGATIVE  NEGATIVE   Leukocytes, UA NEGATIVE  NEGATIVE  HCG, QUANTITATIVE, PREGNANCY     Status: Abnormal   Collection Time    09/18/12  3:15 PM      Result Value Range   hCG, Beta Chain, Quant, S 605 (*) <5 mIU/mL   A:  Pelvic Pain in Pregnancy  P: DC to home Ectopic Precautions Return to [facility] for ultrasound in 7 days

## 2012-09-18 NOTE — MAU Note (Signed)
States she has had abdominal and back pain X 5 days ago. Was seen in MAU 2 days ago. States she is here for blood test.

## 2012-09-20 ENCOUNTER — Inpatient Hospital Stay (HOSPITAL_COMMUNITY): Payer: Medicaid Other

## 2012-09-20 ENCOUNTER — Encounter (HOSPITAL_COMMUNITY): Payer: Self-pay | Admitting: *Deleted

## 2012-09-20 ENCOUNTER — Inpatient Hospital Stay (HOSPITAL_COMMUNITY)
Admission: AD | Admit: 2012-09-20 | Discharge: 2012-09-20 | Disposition: A | Discharge status: Home / Self Care | Payer: Medicaid Other | Source: Ambulatory Visit | Attending: Obstetrics and Gynecology | Admitting: Obstetrics and Gynecology

## 2012-09-20 DIAGNOSIS — N949 Unspecified condition associated with female genital organs and menstrual cycle: Secondary | ICD-10-CM | POA: Insufficient documentation

## 2012-09-20 DIAGNOSIS — O9989 Other specified diseases and conditions complicating pregnancy, childbirth and the puerperium: Secondary | ICD-10-CM

## 2012-09-20 DIAGNOSIS — O209 Hemorrhage in early pregnancy, unspecified: Secondary | ICD-10-CM | POA: Insufficient documentation

## 2012-09-20 DIAGNOSIS — O26899 Other specified pregnancy related conditions, unspecified trimester: Secondary | ICD-10-CM

## 2012-09-20 LAB — CBC
HCT: 36.9 % (ref 36.0–46.0)
Hemoglobin: 12.8 g/dL (ref 12.0–15.0)
RBC: 4.18 MIL/uL (ref 3.87–5.11)
WBC: 8.7 10*3/uL (ref 4.0–10.5)

## 2012-09-20 LAB — HCG, QUANTITATIVE, PREGNANCY: hCG, Beta Chain, Quant, S: 1066 m[IU]/mL — ABNORMAL HIGH (ref <5)

## 2012-09-20 MED ORDER — HYDROMORPHONE HCL PF 1 MG/ML IJ SOLN
1.0000 mg | Freq: Once | INTRAMUSCULAR | Status: AC
  Administered 2012-09-20: 1 mg via INTRAMUSCULAR
  Filled 2012-09-20: qty 1

## 2012-09-20 NOTE — MAU Note (Signed)
Pt started bleeding tonight with low abd cramping after  having had sex tonight. Feels pressure on her rectum and numbness down her right leg.

## 2012-09-20 NOTE — MAU Provider Note (Signed)
History     CSN: [id number]  Arrival date and time: 09/20/12 4696   First Provider Initiated Contact with Patient 09/20/12 2048      No chief complaint on file.  HPI  Monica Phelps is a 27 y.o. G1P0 at [redacted]w[redacted]d who presents today with increasing pain and bleeding like a light period. She was seen here on 09/16/12 and [place] did not see a IUP, but her HCG did rise as expected.   Past Medical History  Diagnosis Date  . Reflux   . UTI (lower urinary tract infection)   . Anxiety   . Constipation   . Gastritis     History reviewed. No pertinent past surgical history.  History reviewed. No pertinent family history.  History  Substance Use Topics  . Smoking status: Never Smoker   . Smokeless tobacco: Not on file  . Alcohol Use: No    Allergies: No Known Allergies  Prescriptions prior to admission  Medication Sig Dispense Refill  . CALCIUM PO Take 400 Units by mouth daily.      Marland Kitchen FOLIC ACID PO Take 1 tablet by mouth daily.      . Multiple Vitamin (MULTIVITAMIN WITH MINERALS) TABS tablet Take 1 tablet by mouth daily.        ROS Physical Exam   Last menstrual period 08/15/2012.  Physical Exam  Nursing note and vitals reviewed. Constitutional: She appears well-developed and well-nourished. No distress.  Cardiovascular: Normal rate.   Respiratory: Effort normal.  GI: Soft. There is tenderness.  Genitourinary:  External: no lesion Vagina: small amount of blood seen Cervix: pink, smooth, very tender to exam Uterus: difficult to exam 2/2 pain response     MAU Course  Procedures  Results for orders placed during the hospital encounter of 09/20/12 (from the past 24 hour(s))  CBC     Status: None   Collection Time    09/20/12  8:36 PM      Result Value Range   WBC 8.7  4.0 - 10.5 K/uL   RBC 4.18  3.87 - 5.11 MIL/uL   Hemoglobin 12.8  12.0 - 15.0 g/dL   HCT 29.5  28.4 - 13.2 %   MCV 88.3  78.0 - 100.0 fL   MCH 30.6  26.0 - 34.0 pg   MCHC 34.7  30.0 - 36.0 g/dL   RDW  44.0  10.2 - 72.5 %   Platelets 271  150 - 400 K/uL  HCG, QUANTITATIVE, PREGNANCY     Status: Abnormal   Collection Time    09/20/12  8:36 PM      Result Value Range   hCG, Beta Chain, Quant, S 1066 (*) <5 mIU/mL   US Ob Transvaginal  09/20/2012   *RADIOLOGY REPORT*  Clinical Data: Vaginal bleeding, pelvic pain  TRANSVAGINAL OBSTETRIC US  Technique:  Transvaginal ultrasound was performed for complete evaluation of the gestation as well as the maternal uterus, adnexal regions, and pelvic cul-de-sac.  Comparison:  09/16/2012  Intrauterine gestational sac: Visualized/normal in shape. Yolk sac: Not visualized Embryo: Not visualized Cardiac Activity: Not visualized  MSD: 3  mm  4    w 5    d     [place] EDC: 05/25/13  Maternal uterus/adnexae: The ovaries are normal.  IMPRESSION: Intrauterine gestational sac identified but no fetal pole, yolk sac, or cardiac activity yet visualized, compatible with the very short time interval since the recent prior exam 09/16/2012.  Follow- up is recommended to determine proper pregnancy progression.  Original Report Authenticated By: Christiana Pellant, M.D.     Assessment and Plan   1. Pelvic pain complicating pregnancy, antepartum, first trimester    Start [facility] as soon as possible Repeat ultrasound in 2 weeks to confirm viability First trimester danger signs reviewed   Tawnya Crook 09/20/2012, 8:55 PM

## 2012-09-21 NOTE — MAU Provider Note (Signed)
Attestation of Attending Supervision of Advanced Practitioner (CNM/NP): Evaluation and management procedures were performed by the Advanced Practitioner under my supervision and collaboration.  I have reviewed the Advanced Practitioner's note and chart, and I agree with the management and plan.  Brogan Martis 09/21/2012 9:03 AM

## 2012-09-24 ENCOUNTER — Encounter (HOSPITAL_COMMUNITY): Payer: Self-pay

## 2012-09-24 ENCOUNTER — Inpatient Hospital Stay (HOSPITAL_COMMUNITY): Payer: Medicaid Other

## 2012-09-24 ENCOUNTER — Inpatient Hospital Stay (HOSPITAL_COMMUNITY)
Admission: AD | Admit: 2012-09-24 | Discharge: 2012-09-24 | Disposition: A | Discharge status: Home / Self Care | Payer: Medicaid Other | Source: Ambulatory Visit | Attending: Family Medicine | Admitting: Family Medicine

## 2012-09-24 DIAGNOSIS — R109 Unspecified abdominal pain: Secondary | ICD-10-CM | POA: Insufficient documentation

## 2012-09-24 DIAGNOSIS — O209 Hemorrhage in early pregnancy, unspecified: Secondary | ICD-10-CM

## 2012-09-24 DIAGNOSIS — O210 Mild hyperemesis gravidarum: Secondary | ICD-10-CM | POA: Insufficient documentation

## 2012-09-24 LAB — URINALYSIS, ROUTINE W REFLEX MICROSCOPIC
Glucose, UA: NEGATIVE mg/dL
Nitrite: NEGATIVE
Protein, ur: NEGATIVE mg/dL
pH: 6 (ref 5.0–8.0)

## 2012-09-24 LAB — CBC
Hemoglobin: 13.6 g/dL (ref 12.0–15.0)
MCH: 30.6 pg (ref 26.0–34.0)
MCHC: 34.2 g/dL (ref 30.0–36.0)
Platelets: 276 10*3/uL (ref 150–400)

## 2012-09-24 LAB — URINE MICROSCOPIC-ADD ON

## 2012-09-24 LAB — HCG, QUANTITATIVE, PREGNANCY: hCG, Beta Chain, Quant, S: 5947 m[IU]/mL — ABNORMAL HIGH (ref <5)

## 2012-09-24 NOTE — MAU Note (Signed)
G1; had Bhcg levels on the 8th and 10th of July; had bleeding on July 10th and was evaluated in MAU; passed a large clot today and is concerned that she may have miscarried;

## 2012-09-24 NOTE — MAU Provider Note (Signed)
Chief Complaint: Vaginal Bleeding and Abdominal Pain   First Provider Initiated Contact with Patient 09/24/12 2046     SUBJECTIVE HPI: Monica Phelps is a 27 y.o. G1P0 at [redacted]w[redacted]d by LMP who presents to maternity admissions reporting passing large clots today. She thinks she may have miscarried. No bleeding now. GC Chlamydia wet prep negative at last visit. Seen in maternity admissions 3 times this pregnancy for pain and bleeding. Normal  rise in hCG. Ultrasound showed size equals dates gestational sac, no yolk sac or fetal pole. Denies fever, chills, urinary complaints, vaginal discharge, GI complaints except for mild nausea . Still having mild cramping same as at previous visits.  Past Medical History  Diagnosis Date  . Reflux   . UTI (lower urinary tract infection)   . Anxiety   . Constipation   . Gastritis    OB History  Gravida Para Term Preterm AB SAB TAB Ectopic Multiple Living  1             # Outcome Date GA Lbr Len/2nd Weight Sex Delivery Anes PTL Lv  1 CUR              Past Surgical History  Procedure Laterality Date  . No past surgeries     History   Social History  . Marital Status: Married    Spouse Name: N/A    Number of Children: N/A  . Years of Education: N/A   Occupational History  . Not on file.   Social History Main Topics  . Smoking status: Never Smoker   . Smokeless tobacco: Not on file  . Alcohol Use: No  . Drug Use: No  . Sexual Activity: Yes    Birth Control/ Protection: None     Comment: last sex tonight   Other Topics Concern  . Not on file   Social History Narrative  . No narrative on file   No current facility-administered medications on file prior to encounter.   Current Outpatient Prescriptions on File Prior to Encounter  Medication Sig Dispense Refill  . CALCIUM PO Take 400 Units by mouth daily.      Marland Kitchen FOLIC ACID PO Take 1 tablet by mouth daily.      . Multiple Vitamin (MULTIVITAMIN WITH MINERALS) TABS tablet Take 1 tablet by mouth  daily.       Allergies  Allergen Reactions  . Dilaudid [Hydromorphone Hcl] Anaphylaxis    ROS: Pertinent items in HPI  OBJECTIVE Blood pressure 105/78, pulse 123, temperature 98.6 F (37 C), temperature source Oral, resp. rate 18, height 5' 3.5" (1.613 m), weight 73.392 kg (161 lb 12.8 oz), last menstrual period 08/15/2012, SpO2 100.00%. GENERAL: Well-developed, well-nourished female in no acute distress. Anxious. HEENT: Normocephalic HEART: normal rate RESP: normal effort ABDOMEN: Soft, non-tender. No CVA tenderness. Positive bowel sounds. EXTREMITIES: Nontender, no edema NEURO: Alert and oriented SPECULUM EXAM: Declined  LAB RESULTS Results for orders placed during the hospital encounter of 09/24/12 (from the past 24 hour(s))  URINALYSIS, ROUTINE W REFLEX MICROSCOPIC     Status: Abnormal   Collection Time    09/24/12  7:15 PM      Result Value Range   Color, Urine YELLOW  YELLOW   APPearance CLEAR  CLEAR   Specific Gravity, Urine 1.020  1.005 - 1.030   pH 6.0  5.0 - 8.0   Glucose, UA NEGATIVE  NEGATIVE mg/dL   Hgb urine dipstick SMALL (*) NEGATIVE   Bilirubin Urine NEGATIVE  NEGATIVE  Ketones, ur NEGATIVE  NEGATIVE mg/dL   Protein, ur NEGATIVE  NEGATIVE mg/dL   Urobilinogen, UA 0.2  0.0 - 1.0 mg/dL   Nitrite NEGATIVE  NEGATIVE   Leukocytes, UA TRACE (*) NEGATIVE  URINE MICROSCOPIC-ADD ON     Status: Abnormal   Collection Time    09/24/12  7:15 PM      Result Value Range   Squamous Epithelial / LPF FEW (*) RARE   WBC, UA 3-6  <3 WBC/hpf   RBC / HPF 0-2  <3 RBC/hpf   Bacteria, UA MANY (*) RARE   Urine-Other MUCOUS PRESENT    HCG, QUANTITATIVE, PREGNANCY     Status: Abnormal   Collection Time    09/24/12  8:30 PM      Result Value Range   hCG, Beta Chain, Quant, S 5947 (*) <5 mIU/mL  CBC     Status: None   Collection Time    09/24/12  8:30 PM      Result Value Range   WBC 8.9  4.0 - 10.5 K/uL   RBC 4.45  3.87 - 5.11 MIL/uL   Hemoglobin 13.6  12.0 - 15.0 g/dL    HCT 16.1  09.6 - 04.5 %   MCV 89.4  78.0 - 100.0 fL   MCH 30.6  26.0 - 34.0 pg   MCHC 34.2  30.0 - 36.0 g/dL   RDW 40.9  81.1 - 91.4 %   Platelets 276  150 - 400 K/uL    IMAGING US Ob Transvaginal  09/24/2012   *RADIOLOGY REPORT*  Clinical Data: Vaginal bleeding and pain, positive pregnancy test  TRANSVAGINAL OBSTETRIC US  Technique:  Transvaginal ultrasound was performed for complete evaluation of the gestation as well as the maternal uterus, adnexal regions, and pelvic cul-de-sac.  Comparison:  09/20/2012, 09/16/2012  Intrauterine gestational sac: Visualized/normal in shape. Yolk sac: Visualized Embryo: Not visualized Cardiac Activity: Not visualized  MSD: 8  mm  5    w 3    d     [place] EDC: 05/24/13  Maternal uterus/adnexae: Ovaries are normal.  IMPRESSION: Intrauterine gestational sac and yolk sac now visualized but no fetal pole or cardiac activity yet seen, likely due to the very short time interval since the prior exam.  Follow-up is recommended in at least 7-10 days to determine appropriate pregnancy progression.   Original Report Authenticated By: Christiana Pellant, M.D.   ASSESSMENT 1. First trimester bleeding    IUP verified. Appropriate progression since previous ultrasound.  PLAN Discharge home in stable condition. Bleeding precautions. Pelvic rest. List of providers given. Discussed diet for nausea/vomiting of pregnancy.     Follow-up Information   Follow up with Start prenatal care.      Follow up with THE [facility] [place] MATERNITY ADMISSIONS. (As needed emergencies)    Contact information:   [address] [id number] [place] [address] [phone]       Medication List         CALCIUM PO  Take 400 Units by mouth daily.     FOLIC ACID PO  Take 1 tablet by mouth daily.     ibuprofen 200 MG tablet  Commonly known as:  ADVIL,MOTRIN  Take 400 mg by mouth every 6 (six) hours as needed for pain.     multivitamin with minerals Tabs  tablet  Take 1 tablet by mouth daily.       [place], CNM 09/25/2012  12:41 AM

## 2012-09-24 NOTE — MAU Note (Signed)
Patient states last night she had watery red bleeding and abdominal cramping all night. States she has continued to have brown spotting and abdominal pain.

## 2012-09-25 LAB — URINE CULTURE: Culture: NO GROWTH

## 2012-09-25 NOTE — MAU Provider Note (Signed)
Attestation of Attending Supervision of Advanced Practitioner: Evaluation and management procedures were performed by the PA/NP/CNM/OB Fellow under my supervision/collaboration. Chart reviewed and agree with management and plan.  Delitha Elms V 09/25/2012 5:47 AM

## 2012-09-25 NOTE — MAU Provider Note (Signed)
Chart reviewed and agree with management and plan.  

## 2012-09-29 ENCOUNTER — Emergency Department (HOSPITAL_BASED_OUTPATIENT_CLINIC_OR_DEPARTMENT_OTHER)
Admission: EM | Admit: 2012-09-29 | Discharge: 2012-09-29 | Disposition: A | Discharge status: Home / Self Care | Payer: Medicaid Other | Attending: Emergency Medicine | Admitting: Emergency Medicine

## 2012-09-29 ENCOUNTER — Encounter (HOSPITAL_BASED_OUTPATIENT_CLINIC_OR_DEPARTMENT_OTHER): Payer: Self-pay | Admitting: Family Medicine

## 2012-09-29 DIAGNOSIS — Z79899 Other long term (current) drug therapy: Secondary | ICD-10-CM | POA: Insufficient documentation

## 2012-09-29 DIAGNOSIS — Z87440 Personal history of urinary (tract) infections: Secondary | ICD-10-CM | POA: Insufficient documentation

## 2012-09-29 DIAGNOSIS — O9989 Other specified diseases and conditions complicating pregnancy, childbirth and the puerperium: Secondary | ICD-10-CM | POA: Insufficient documentation

## 2012-09-29 DIAGNOSIS — R109 Unspecified abdominal pain: Secondary | ICD-10-CM | POA: Insufficient documentation

## 2012-09-29 DIAGNOSIS — O219 Vomiting of pregnancy, unspecified: Secondary | ICD-10-CM

## 2012-09-29 DIAGNOSIS — Z8719 Personal history of other diseases of the digestive system: Secondary | ICD-10-CM | POA: Insufficient documentation

## 2012-09-29 DIAGNOSIS — O210 Mild hyperemesis gravidarum: Secondary | ICD-10-CM | POA: Insufficient documentation

## 2012-09-29 DIAGNOSIS — O209 Hemorrhage in early pregnancy, unspecified: Secondary | ICD-10-CM

## 2012-09-29 DIAGNOSIS — Z3490 Encounter for supervision of normal pregnancy, unspecified, unspecified trimester: Secondary | ICD-10-CM

## 2012-09-29 LAB — URINALYSIS, ROUTINE W REFLEX MICROSCOPIC
Glucose, UA: NEGATIVE mg/dL
Hgb urine dipstick: NEGATIVE
Leukocytes, UA: NEGATIVE
Specific Gravity, Urine: 1.017 (ref 1.005–1.030)
Urobilinogen, UA: 0.2 mg/dL (ref 0.0–1.0)

## 2012-09-29 LAB — URINE MICROSCOPIC-ADD ON

## 2012-09-29 MED ORDER — ONDANSETRON HCL 4 MG PO TABS: 4.0000 mg | ORAL_TABLET | Freq: Four times a day (QID) | ORAL | Status: DC

## 2012-09-29 MED ORDER — ONDANSETRON HCL 4 MG/2ML IJ SOLN
4.0000 mg | Freq: Once | INTRAMUSCULAR | Status: AC
  Administered 2012-09-29: 4 mg via INTRAVENOUS
  Filled 2012-09-29: qty 2

## 2012-09-29 MED ORDER — SODIUM CHLORIDE 0.9 % IV BOLUS (SEPSIS)
1000.0000 mL | Freq: Once | INTRAVENOUS | Status: AC
  Administered 2012-09-29: 1000 mL via INTRAVENOUS

## 2012-09-29 NOTE — ED Provider Notes (Signed)
CSN: [id number]     Arrival date & time 09/29/12  1412 History     First MD Initiated Contact with Patient 09/29/12 1459     Chief Complaint  Patient presents with  . Abdominal Pain   (Consider location/radiation/quality/duration/timing/severity/associated sxs/prior Treatment) HPI Comments: Patient is a 27 year old G1 P0 female who presents with a [redacted] weeks pregnant with suprapubic pain and cramping. She's been evaluated at [facility] for this pain. She states when she was there they gave her morphine which made her vomit so she does not want to go back. The pain has been gradually worsening for the past 2 weeks. She has had some vaginal bleeding. She is taking ibuprofen and acetaminophen for her pain which has not helped it. The pain is keeping her from sleeping. She does not work and lays in her bed all day without any relief of symptoms.  Patient is a 26 y.o. female presenting with abdominal pain. The history is provided by the patient. No language interpreter was used.  Abdominal Pain Associated symptoms: nausea and vomiting   Associated symptoms: no chest pain, no chills, no fever and no shortness of breath     Past Medical History  Diagnosis Date  . Reflux   . UTI (lower urinary tract infection)   . Anxiety   . Constipation   . Gastritis    Past Surgical History  Procedure Laterality Date  . No past surgeries     Family History  Problem Relation Age of Onset  . Alcohol abuse Neg Hx   . Arthritis Neg Hx   . Asthma Neg Hx   . Birth defects Neg Hx   . Cancer Neg Hx   . Depression Neg Hx   . COPD Neg Hx   . Diabetes Neg Hx   . Drug abuse Neg Hx   . Early death Neg Hx   . Hearing loss Neg Hx   . Heart disease Neg Hx   . Hyperlipidemia Neg Hx   . Hypertension Neg Hx   . Kidney disease Neg Hx   . Learning disabilities Neg Hx   . Mental illness Neg Hx   . Mental retardation Neg Hx   . Miscarriages / Stillbirths Neg Hx   . Stroke Neg Hx   . Vision loss Neg Hx     History  Substance Use Topics  . Smoking status: Never Smoker   . Smokeless tobacco: Not on file  . Alcohol Use: No   OB History   Grav Para Term Preterm Abortions TAB SAB Ect Mult Living   1              Review of Systems  Constitutional: Negative for fever and chills.  Respiratory: Negative for shortness of breath.   Cardiovascular: Negative for chest pain.  Gastrointestinal: Positive for nausea, vomiting and abdominal pain.  Skin: Negative for rash.  All other systems reviewed and are negative.    Allergies  Dilaudid and Morphine and related  Home Medications   Current Outpatient Rx  Name  Route  Sig  Dispense  Refill  . CALCIUM PO   Oral   Take 400 Units by mouth daily.         Marland Kitchen FOLIC ACID PO   Oral   Take 1 tablet by mouth daily.         Marland Kitchen ibuprofen (ADVIL,MOTRIN) 200 MG tablet   Oral   Take 400 mg by mouth every 6 (six) hours as needed for  pain.         . Multiple Vitamin (MULTIVITAMIN WITH MINERALS) TABS tablet   Oral   Take 1 tablet by mouth daily.          BP 130/95  Pulse 77  Temp(Src) 98.4 F (36.9 C) (Oral)  Resp 16  SpO2 100%  LMP 08/15/2012 Physical Exam  Nursing note and vitals reviewed. Constitutional: She is oriented to person, place, and time. She appears well-developed and well-nourished. No distress.  HENT:  Head: Normocephalic and atraumatic.  Right Ear: External ear normal.  Left Ear: External ear normal.  Nose: Nose normal.  Mouth/Throat: Oropharynx is clear and moist.  Eyes: Conjunctivae are normal.  Neck: Normal range of motion.  Cardiovascular: Normal rate, regular rhythm and normal heart sounds.   Pulmonary/Chest: Effort normal and breath sounds normal. No stridor. No respiratory distress. She has no wheezes. She has no rales.  Abdominal: Soft. She exhibits no distension. There is tenderness in the suprapubic area. There is no rigidity and no guarding.  Musculoskeletal: Normal range of motion.  Neurological:  She is alert and oriented to person, place, and time. She has normal strength.  Skin: Skin is warm and dry. She is not diaphoretic. No erythema.  Psychiatric: She has a normal mood and affect. Her behavior is normal.    ED Course   Procedures (including critical care time)  Labs Reviewed  URINALYSIS, ROUTINE W REFLEX MICROSCOPIC - Abnormal; Notable for the following:    APPearance TURBID (*)    All other components within normal limits  URINE MICROSCOPIC-ADD ON - Abnormal; Notable for the following:    Casts GRANULAR CAST (*)    All other components within normal limits   No results found. 1. Pregnancy   2. Nausea/vomiting in pregnancy   3. Vaginal bleeding in pregnancy, first trimester     MDM  Patient presents with continued pain in first trimester pregnancy. She has been evaluated previously at [facility] and has a confirmed intrauterine pregnancy. She has vaginal bleeding. Discussed that first trimester vaginal bleeding has a higher risk for miscarriage, however this is does not mean that she will have one. Also discussed that ibuprofen was not safe to take during pregnancy. Patient feels significantly improved after fluids and Zofran. We will DC home with Zofran. Discussed that she needs to concentrate on staying hydrated to reduce her pain. Dr. Fayrene Fearing evaluated the patient and agrees with plan. Follow up with your OB doctor. Return instructions given. Vital signs stable for discharge. Patient / Family / Caregiver informed of clinical course, understand medical decision-making process, and agree with plan.   Mora Bellman, PA-C 09/30/12 1050

## 2012-09-29 NOTE — ED Notes (Addendum)
Pt c/o lower abdominal pain and lower back pain. Pt sts she has been seen several times for same past 2 wks. Pt sts she is pregnant and "they said everything was ok with pregnancy". Pt sts she is very anxious with pain and pregnancy.

## 2012-09-30 NOTE — ED Provider Notes (Signed)
Medical screening examination/treatment/procedure(s) were performed by non-physician practitioner and as supervising physician I was immediately available for consultation/collaboration.  Claudean Kinds, MD 09/30/12 1925

## 2012-10-04 ENCOUNTER — Inpatient Hospital Stay (HOSPITAL_COMMUNITY)
Admission: AD | Admit: 2012-10-04 | Discharge: 2012-10-04 | DRG: 781 | Disposition: A | Discharge status: Home / Self Care | Payer: Medicaid Other | Source: Ambulatory Visit | Attending: Obstetrics & Gynecology | Admitting: Obstetrics & Gynecology

## 2012-10-04 ENCOUNTER — Encounter (HOSPITAL_COMMUNITY): Payer: Self-pay | Admitting: Family

## 2012-10-04 DIAGNOSIS — R109 Unspecified abdominal pain: Secondary | ICD-10-CM | POA: Diagnosis present

## 2012-10-04 DIAGNOSIS — O9989 Other specified diseases and conditions complicating pregnancy, childbirth and the puerperium: Secondary | ICD-10-CM

## 2012-10-04 DIAGNOSIS — O210 Mild hyperemesis gravidarum: Secondary | ICD-10-CM | POA: Diagnosis present

## 2012-10-04 DIAGNOSIS — O26899 Other specified pregnancy related conditions, unspecified trimester: Secondary | ICD-10-CM

## 2012-10-04 DIAGNOSIS — O99891 Other specified diseases and conditions complicating pregnancy: Principal | ICD-10-CM | POA: Diagnosis present

## 2012-10-04 DIAGNOSIS — K5900 Constipation, unspecified: Secondary | ICD-10-CM | POA: Diagnosis present

## 2012-10-04 LAB — URINALYSIS, ROUTINE W REFLEX MICROSCOPIC
Bilirubin Urine: NEGATIVE
Glucose, UA: NEGATIVE mg/dL
Specific Gravity, Urine: 1.015 (ref 1.005–1.030)
Urobilinogen, UA: 0.2 mg/dL (ref 0.0–1.0)

## 2012-10-04 LAB — URINE MICROSCOPIC-ADD ON

## 2012-10-04 MED ORDER — PROMETHAZINE HCL 25 MG PO TABS
25.0000 mg | ORAL_TABLET | Freq: Four times a day (QID) | ORAL | Status: DC | PRN
  Administered 2012-10-04: 25 mg via ORAL
  Filled 2012-10-04: qty 1

## 2012-10-04 MED ORDER — FLEET ENEMA 7-19 GM/118ML RE ENEM
1.0000 | ENEMA | RECTAL | Status: AC
  Administered 2012-10-04: 1 via RECTAL

## 2012-10-04 MED ORDER — PROMETHAZINE HCL 25 MG PO TABS: 12.5000 mg | ORAL_TABLET | Freq: Four times a day (QID) | ORAL | Status: DC | PRN

## 2012-10-04 NOTE — MAU Provider Note (Signed)
Chief Complaint: Nausea and Constipation   First Provider Initiated Contact with Patient 10/04/12 1537     SUBJECTIVE HPI: Monica Phelps is a 27 y.o. G1P0 at [redacted]w[redacted]d by LMP who presents to maternity admissions reporting abdominal cramping, daily nausea and vomiting x1 in 24 hours, and constipation with last BM 1 week ago. She has been taking Zofran daily for nausea.  She is worried about the pregnancy as her last U/S was too early to see the heartbeat.  She has an initial prenatal visit scheduled in [facility] in September.  She denies LOF, vaginal bleeding, vaginal itching/burning, urinary symptoms, h/a, dizziness, n/v, or fever/chills.     Past Medical History  Diagnosis Date  . Reflux   . UTI (lower urinary tract infection)   . Anxiety   . Constipation   . Gastritis    Past Surgical History  Procedure Laterality Date  . No past surgeries     History   Social History  . Marital Status: Married    Spouse Name: N/A    Number of Children: N/A  . Years of Education: N/A   Occupational History  . Not on file.   Social History Main Topics  . Smoking status: Never Smoker   . Smokeless tobacco: Not on file  . Alcohol Use: No  . Drug Use: No  . Sexual Activity: Yes    Birth Control/ Protection: None     Comment: last sex tonight   Other Topics Concern  . Not on file   Social History Narrative  . No narrative on file   No current facility-administered medications on file prior to encounter.   Current Outpatient Prescriptions on File Prior to Encounter  Medication Sig Dispense Refill  . CALCIUM PO Take 400 Units by mouth daily.      Marland Kitchen FOLIC ACID PO Take 1 tablet by mouth daily.      Marland Kitchen ibuprofen (ADVIL,MOTRIN) 200 MG tablet Take 400 mg by mouth every 6 (six) hours as needed for pain.      . Multiple Vitamin (MULTIVITAMIN WITH MINERALS) TABS tablet Take 1 tablet by mouth daily.       Allergies  Allergen Reactions  . Dilaudid [Hydromorphone Hcl] Anaphylaxis  . Morphine And  Related     ROS: Pertinent items in HPI  OBJECTIVE Blood pressure 108/81, pulse 102, temperature 97.5 F (36.4 C), temperature source Oral, resp. rate 16, height 5' 5.5" (1.664 m), weight 72.757 kg (160 lb 6.4 oz), last menstrual period 08/15/2012. GENERAL: Well-developed, mildly anxious, well-nourished female in no acute distress.  HEENT: Normocephalic HEART: normal rate RESP: normal effort ABDOMEN: Soft, non-tender EXTREMITIES: Nontender, no edema NEURO: Alert and oriented SPECULUM EXAM: Deferred  LAB RESULTS Results for orders placed during the hospital encounter of 10/04/12 (from the past 24 hour(s))  URINALYSIS, ROUTINE W REFLEX MICROSCOPIC     Status: Abnormal   Collection Time    10/04/12  2:20 PM      Result Value Range   Color, Urine YELLOW  YELLOW   APPearance CLEAR  CLEAR   Specific Gravity, Urine 1.015  1.005 - 1.030   pH 7.0  5.0 - 8.0   Glucose, UA NEGATIVE  NEGATIVE mg/dL   Hgb urine dipstick TRACE (*) NEGATIVE   Bilirubin Urine NEGATIVE  NEGATIVE   Ketones, ur NEGATIVE  NEGATIVE mg/dL   Protein, ur NEGATIVE  NEGATIVE mg/dL   Urobilinogen, UA 0.2  0.0 - 1.0 mg/dL   Nitrite NEGATIVE  NEGATIVE  Leukocytes, UA NEGATIVE  NEGATIVE  URINE MICROSCOPIC-ADD ON     Status: Abnormal   Collection Time    10/04/12  2:20 PM      Result Value Range   Squamous Epithelial / LPF FEW (*) RARE   RBC / HPF 0-2  <3 RBC/hpf   Bacteria, UA FEW (*) RARE    ASSESSMENT 1. Abdominal pain in pregnancy   2. Constipation in pregnancy in first trimester     PLAN Fleet enema given in MAU. Pt reports improvement in pain after BM. Discharge home Outpatient U/S ordered for viability Phenergan 12.5-25 mg PO Q 6 hours PRN nausea Stop Zofran and Calcium supplements Increase PO fluids and fiber F/U with prenatal provider as scheduled Return to MAU as needed       Follow-up Information   Follow up with THE [facility] OF [place] MATERNITY ADMISSIONS. (Keep scheduled  prenatal visit in [facility].  Return to MAU as needed.)    Contact information:   [address] [id number] [place] [address] [phone]      Sharen Counter Certified Nurse-Midwife 10/04/2012  3:42 PM

## 2012-10-04 NOTE — MAU Note (Signed)
Patient presents to MAU with c/o constipation x 8 days, last BM on 8/16. Reports N/V - one emesis occurrence in last 24 hours.  Denies VB. Reports she stopped taking prenatal vitamin 3 days ago. Reports n/v is worse at night after meals.

## 2012-10-20 ENCOUNTER — Emergency Department (HOSPITAL_BASED_OUTPATIENT_CLINIC_OR_DEPARTMENT_OTHER): Payer: Medicaid Other

## 2012-10-20 ENCOUNTER — Encounter (HOSPITAL_BASED_OUTPATIENT_CLINIC_OR_DEPARTMENT_OTHER): Payer: Self-pay | Admitting: *Deleted

## 2012-10-20 ENCOUNTER — Emergency Department (HOSPITAL_BASED_OUTPATIENT_CLINIC_OR_DEPARTMENT_OTHER)
Admission: EM | Admit: 2012-10-20 | Discharge: 2012-10-21 | Disposition: A | Discharge status: Home / Self Care | Payer: Medicaid Other | Attending: Emergency Medicine | Admitting: Emergency Medicine

## 2012-10-20 DIAGNOSIS — O9989 Other specified diseases and conditions complicating pregnancy, childbirth and the puerperium: Secondary | ICD-10-CM | POA: Insufficient documentation

## 2012-10-20 DIAGNOSIS — R112 Nausea with vomiting, unspecified: Secondary | ICD-10-CM | POA: Insufficient documentation

## 2012-10-20 DIAGNOSIS — Z8719 Personal history of other diseases of the digestive system: Secondary | ICD-10-CM | POA: Insufficient documentation

## 2012-10-20 DIAGNOSIS — Z87440 Personal history of urinary (tract) infections: Secondary | ICD-10-CM | POA: Insufficient documentation

## 2012-10-20 DIAGNOSIS — O26899 Other specified pregnancy related conditions, unspecified trimester: Secondary | ICD-10-CM

## 2012-10-20 DIAGNOSIS — R1013 Epigastric pain: Secondary | ICD-10-CM | POA: Insufficient documentation

## 2012-10-20 DIAGNOSIS — Z79899 Other long term (current) drug therapy: Secondary | ICD-10-CM | POA: Insufficient documentation

## 2012-10-20 DIAGNOSIS — Z8659 Personal history of other mental and behavioral disorders: Secondary | ICD-10-CM | POA: Insufficient documentation

## 2012-10-20 LAB — COMPREHENSIVE METABOLIC PANEL
ALT: 16 U/L (ref 0–35)
AST: 15 U/L (ref 0–37)
CO2: 26 mEq/L (ref 19–32)
Calcium: 9.2 mg/dL (ref 8.4–10.5)
Creatinine, Ser: 0.6 mg/dL (ref 0.50–1.10)
GFR calc Af Amer: >90 mL/min (ref >90)
GFR calc non Af Amer: >90 mL/min (ref >90)
Sodium: 135 mEq/L (ref 135–145)
Total Protein: 6.6 g/dL (ref 6.0–8.3)

## 2012-10-20 LAB — URINALYSIS, ROUTINE W REFLEX MICROSCOPIC
Nitrite: NEGATIVE
Protein, ur: NEGATIVE mg/dL
Specific Gravity, Urine: 1.015 (ref 1.005–1.030)
Urobilinogen, UA: 0.2 mg/dL (ref 0.0–1.0)

## 2012-10-20 LAB — CBC WITH DIFFERENTIAL/PLATELET
Basophils Absolute: 0 10*3/uL (ref 0.0–0.1)
Basophils Relative: 0 % (ref 0–1)
Eosinophils Absolute: 0.2 10*3/uL (ref 0.0–0.7)
Eosinophils Relative: 1 % (ref 0–5)
HCT: 35.7 % — ABNORMAL LOW (ref 36.0–46.0)
MCH: 31.5 pg (ref 26.0–34.0)
MCHC: 35.3 g/dL (ref 30.0–36.0)
MCV: 89.3 fL (ref 78.0–100.0)
Monocytes Absolute: 0.9 10*3/uL (ref 0.1–1.0)
Platelets: 260 10*3/uL (ref 150–400)
RDW: 11.9 % (ref 11.5–15.5)
WBC: 12.7 10*3/uL — ABNORMAL HIGH (ref 4.0–10.5)

## 2012-10-20 LAB — URINE MICROSCOPIC-ADD ON

## 2012-10-20 MED ORDER — PANTOPRAZOLE SODIUM 20 MG PO TBEC: 20.0000 mg | DELAYED_RELEASE_TABLET | Freq: Every day | ORAL | Status: DC

## 2012-10-20 MED ORDER — ONDANSETRON HCL 4 MG/2ML IJ SOLN
4.0000 mg | Freq: Once | INTRAMUSCULAR | Status: AC
  Administered 2012-10-20: 4 mg via INTRAVENOUS
  Filled 2012-10-20: qty 2

## 2012-10-20 NOTE — ED Notes (Signed)
Pt is pregnant and has and estimated due date of 05/24/13 per previous US done here.

## 2012-10-20 NOTE — ED Provider Notes (Signed)
CSN: [id number]     Arrival date & time 10/20/12  2059 History  This chart was scribed for Gilda Crease, MD by Bennett Scrape, ED Scribe. This patient was seen in room MH03/MH03 and the patient's care was started at 9:56 PM.   Chief Complaint  Patient presents with  . Abdominal Pain    The history is provided by the patient. No language interpreter was used.    HPI Comments: Monica Phelps is a 27 y.o. female who is [redacted] weeks pregnant who presents to the Emergency Department complaining of one week of epigastric abdominal pain that has worsened over the past 5 days with associated nausea and emesis. She states that she has been taking Mylanta and Darvocet with no improvement. She denies any changes with eating. She has a h/o GERD but denies similarities. She states that [facility] and was told that she is high risk for a miscarriage. She reports that she has occasional lower abdominal pain attributed to her pregnancy but denies any similarities or changes. Her estimated due date is 05/24/13 per prior US performed in the ED. She denies any vaginal bleeding or discharge, fever and urinary symptoms as associated symptoms.    Past Medical History  Diagnosis Date  . Reflux   . UTI (lower urinary tract infection)   . Anxiety   . Constipation   . Gastritis    Past Surgical History  Procedure Laterality Date  . No past surgeries     Family History  Problem Relation Age of Onset  . Alcohol abuse Neg Hx   . Arthritis Neg Hx   . Asthma Neg Hx   . Birth defects Neg Hx   . Cancer Neg Hx   . Depression Neg Hx   . COPD Neg Hx   . Diabetes Neg Hx   . Drug abuse Neg Hx   . Early death Neg Hx   . Hearing loss Neg Hx   . Heart disease Neg Hx   . Hyperlipidemia Neg Hx   . Hypertension Neg Hx   . Kidney disease Neg Hx   . Learning disabilities Neg Hx   . Mental illness Neg Hx   . Mental retardation Neg Hx   . Miscarriages / Stillbirths Neg Hx    . Stroke Neg Hx   . Vision loss Neg Hx    History  Substance Use Topics  . Smoking status: Never Smoker   . Smokeless tobacco: Not on file  . Alcohol Use: No   OB History   Grav Para Term Preterm Abortions TAB SAB Ect Mult Living   1              Review of Systems  Constitutional: Negative for fever.  Gastrointestinal: Positive for nausea, vomiting and abdominal pain. Negative for diarrhea.  Genitourinary: Negative for dysuria, hematuria, vaginal bleeding and vaginal discharge.  All other systems reviewed and are negative.    Allergies  Dilaudid and Morphine and related  Home Medications   Current Outpatient Rx  Name  Route  Sig  Dispense  Refill  . FOLIC ACID PO   Oral   Take 1 tablet by mouth daily.         . Multiple Vitamin (MULTIVITAMIN WITH MINERALS) TABS tablet   Oral   Take 1 tablet by mouth daily.         . promethazine (PHENERGAN) 25 MG tablet   Oral  Take 0.5-1 tablets (12.5-25 mg total) by mouth every 6 (six) hours as needed.   30 tablet   0    Triage Vitals: BP 150/91  Pulse 89  Temp(Src) 98.6 F (37 C) (Oral)  Resp 16  Ht 5\' 3"  (1.6 m)  Wt 160 lb (72.576 kg)  BMI 28.35 kg/m2  SpO2 99%  LMP 08/15/2012  Physical Exam  Nursing note and vitals reviewed. Constitutional: She is oriented to person, place, and time. She appears well-developed and well-nourished. No distress.  HENT:  Head: Normocephalic and atraumatic.  Right Ear: Hearing normal.  Left Ear: Hearing normal.  Nose: Nose normal.  Mouth/Throat: Oropharynx is clear and moist and mucous membranes are normal.  Eyes: Conjunctivae and EOM are normal. Pupils are equal, round, and reactive to light.  Neck: Normal range of motion. Neck supple.  Cardiovascular: Regular rhythm, S1 normal and S2 normal.  Exam reveals no gallop and no friction rub.   No murmur heard. Pulmonary/Chest: Effort normal and breath sounds normal. No respiratory distress. She exhibits no tenderness.   Abdominal: Soft. Normal appearance and bowel sounds are normal. There is no hepatosplenomegaly. There is tenderness (mild epigastric tenderness). There is no rebound, no guarding, no tenderness at McBurney's point and negative Murphy's sign. No hernia.  Musculoskeletal: Normal range of motion.  Neurological: She is alert and oriented to person, place, and time. She has normal strength. No cranial nerve deficit or sensory deficit. Coordination normal. GCS eye subscore is 4. GCS verbal subscore is 5. GCS motor subscore is 6.  Skin: Skin is warm, dry and intact. No rash noted. No cyanosis.  Psychiatric: She has a normal mood and affect. Her speech is normal and behavior is normal. Thought content normal.    ED Course  Procedures (including critical care time)  DIAGNOSTIC STUDIES: Oxygen Saturation is 99% on room air, normal by my interpretation.    COORDINATION OF CARE: 10:00 PM-Discussed treatment plan which includes UA with pt at bedside and pt agreed to plan.   Labs Review Labs Reviewed  URINALYSIS, ROUTINE W REFLEX MICROSCOPIC - Abnormal; Notable for the following:    APPearance CLOUDY (*)    pH 8.5 (*)    Hgb urine dipstick SMALL (*)    Leukocytes, UA TRACE (*)    All other components within normal limits  URINE MICROSCOPIC-ADD ON - Abnormal; Notable for the following:    Squamous Epithelial / LPF FEW (*)    All other components within normal limits  CBC WITH DIFFERENTIAL - Abnormal; Notable for the following:    WBC 12.7 (*)    HCT 35.7 (*)    Neutro Abs 9.2 (*)    All other components within normal limits  COMPREHENSIVE METABOLIC PANEL - Abnormal; Notable for the following:    Albumin 3.4 (*)    All other components within normal limits  LIPASE, BLOOD   Imaging Review No results found.  MDM  Diagnosis: Abdominal pain and pregnancy  Patient presents to the ER for evaluation of abdominal pain. Patient is pregnant. She has been having problems with lower abdominal and  pelvic pain in the past, but reports that that has resolved. She has not been experiencing pain in her upper abdomen mostly on the right upper quadrant region. She had tenderness in this region but no signs of peritonitis. Blood work was unremarkable. Ultrasound is performed to rule out cholecystitis and was negative. Patient reassured, followup with her doctor.  I personally performed the services described in this  documentation, which was scribed in my presence. The recorded information has been reviewed and is accurate.     Gilda Crease, MD 10/20/12 [phone]

## 2012-10-20 NOTE — ED Notes (Addendum)
Pt c/o epigastric pain and heart burn x 5 days, pt is 9 weeks preg

## 2012-10-20 NOTE — ED Notes (Signed)
Patient transported to Ultrasound 

## 2012-12-21 ENCOUNTER — Other Ambulatory Visit (HOSPITAL_COMMUNITY): Payer: Self-pay | Admitting: Obstetrics and Gynecology

## 2012-12-21 DIAGNOSIS — O4100X1 Oligohydramnios, unspecified trimester, fetus 1: Secondary | ICD-10-CM

## 2012-12-22 ENCOUNTER — Ambulatory Visit (HOSPITAL_COMMUNITY)
Admission: RE | Admit: 2012-12-22 | Discharge: 2012-12-22 | Disposition: A | Discharge status: Home / Self Care | Payer: Medicaid Other | Source: Ambulatory Visit | Attending: Obstetrics and Gynecology | Admitting: Obstetrics and Gynecology

## 2012-12-22 ENCOUNTER — Ambulatory Visit (HOSPITAL_COMMUNITY): Admission: RE | Admit: 2012-12-22 | Payer: Medicaid Other | Source: Ambulatory Visit

## 2012-12-22 DIAGNOSIS — O4290 Premature rupture of membranes, unspecified as to length of time between rupture and onset of labor, unspecified weeks of gestation: Secondary | ICD-10-CM | POA: Insufficient documentation

## 2012-12-22 DIAGNOSIS — Z3689 Encounter for other specified antenatal screening: Secondary | ICD-10-CM | POA: Insufficient documentation

## 2012-12-22 DIAGNOSIS — O4100X1 Oligohydramnios, unspecified trimester, fetus 1: Secondary | ICD-10-CM

## 2012-12-24 ENCOUNTER — Other Ambulatory Visit (HOSPITAL_COMMUNITY): Payer: Self-pay | Admitting: Advanced Practice Midwife

## 2012-12-24 DIAGNOSIS — Z0489 Encounter for examination and observation for other specified reasons: Secondary | ICD-10-CM

## 2013-01-01 ENCOUNTER — Ambulatory Visit (HOSPITAL_COMMUNITY)
Admission: RE | Admit: 2013-01-01 | Discharge: 2013-01-01 | Disposition: A | Discharge status: Home / Self Care | Payer: Medicaid Other | Source: Ambulatory Visit | Attending: Obstetrics and Gynecology | Admitting: Obstetrics and Gynecology

## 2013-01-01 DIAGNOSIS — Z0489 Encounter for examination and observation for other specified reasons: Secondary | ICD-10-CM

## 2013-01-04 ENCOUNTER — Encounter: Payer: Medicaid Other | Admitting: Advanced Practice Midwife

## 2013-02-12 ENCOUNTER — Inpatient Hospital Stay (HOSPITAL_COMMUNITY)
Admission: AD | Admit: 2013-02-12 | Discharge: 2013-02-13 | Disposition: A | Discharge status: Home / Self Care | Payer: Medicaid Other | Source: Ambulatory Visit | Attending: Obstetrics and Gynecology | Admitting: Obstetrics and Gynecology

## 2013-02-12 ENCOUNTER — Encounter (HOSPITAL_COMMUNITY): Payer: Self-pay | Admitting: *Deleted

## 2013-02-12 DIAGNOSIS — L293 Anogenital pruritus, unspecified: Secondary | ICD-10-CM | POA: Insufficient documentation

## 2013-02-12 DIAGNOSIS — R109 Unspecified abdominal pain: Secondary | ICD-10-CM | POA: Insufficient documentation

## 2013-02-12 DIAGNOSIS — N949 Unspecified condition associated with female genital organs and menstrual cycle: Secondary | ICD-10-CM | POA: Insufficient documentation

## 2013-02-12 DIAGNOSIS — B3731 Acute candidiasis of vulva and vagina: Secondary | ICD-10-CM

## 2013-02-12 DIAGNOSIS — B373 Candidiasis of vulva and vagina: Secondary | ICD-10-CM | POA: Insufficient documentation

## 2013-02-12 LAB — URINALYSIS, ROUTINE W REFLEX MICROSCOPIC
BILIRUBIN URINE: NEGATIVE
Glucose, UA: NEGATIVE mg/dL
Hgb urine dipstick: NEGATIVE
KETONES UR: NEGATIVE mg/dL
NITRITE: NEGATIVE
PH: 6 (ref 5.0–8.0)
Protein, ur: NEGATIVE mg/dL
Specific Gravity, Urine: 1.02 (ref 1.005–1.030)
Urobilinogen, UA: 0.2 mg/dL (ref 0.0–1.0)

## 2013-02-12 LAB — WET PREP, GENITAL
Clue Cells Wet Prep HPF POC: NONE SEEN
Trich, Wet Prep: NONE SEEN

## 2013-02-12 LAB — URINE MICROSCOPIC-ADD ON

## 2013-02-12 LAB — POCT PREGNANCY, URINE: Preg Test, Ur: NEGATIVE

## 2013-02-12 NOTE — MAU Note (Signed)
PT SAYS SHE WAS PREG - WAS AT  [place] [redacted] WEEKS GESTATION ,  HAD SAB ON 12-23-2012.   BLEEDING HEAVY IN NOV- GAVE HER A SHOT -  BLEEDING  SLOWED DOWN .  STARTED BCP ON 12-6 THEN BLEEDING STOPPED.    BLEEDING STARTED AGAIN ON 12-22- HEAVY  . THEN ON 12-26- WAS IN [place]- VISITING- BLEEDING HEAVY-  3 PADS / HR.-  SHE WENT TO ER- LABS OK-   SHE CALLED  [facility] WHILE SHE WAS IN [place].  HE SAID  TAKE 3 BCP X7 DAYS-   SHE DID.    SO ON 12-27- BLEEDING STOPPED.     THEN ON 12-29- SHE STARTED FEELING PAIN/ BURNING/ RED/ SWOLLEN   IN VAGINA.    YESTERDAY  HAD WHITE/YELLOWISH D/C-  BUT TODAY D/C IS GREENISH- WITH AN ODOR.     FEELS CRAMPS- SOMETIMES  IN HER BACK, VAGINA, LEGS..   IN ROOM 4  - NO BLEEDING .   SHE SAYS DR  TOLD HER SHE HAS FIBROIDS.

## 2013-02-13 DIAGNOSIS — B373 Candidiasis of vulva and vagina: Secondary | ICD-10-CM

## 2013-02-13 DIAGNOSIS — B3731 Acute candidiasis of vulva and vagina: Secondary | ICD-10-CM

## 2013-02-13 MED ORDER — FLUCONAZOLE 150 MG PO TABS: 150.0000 mg | ORAL_TABLET | Freq: Once | ORAL | Status: DC

## 2013-02-13 MED ORDER — TERCONAZOLE 0.4 % VA CREA: 1.0000 | TOPICAL_CREAM | Freq: Every day | VAGINAL | Status: DC

## 2013-02-13 NOTE — MAU Provider Note (Signed)
History     CSN: [id number]  Arrival date and time: 02/12/13 2212   First Provider Initiated Contact with Patient 02/12/13 2305      No chief complaint on file.  HPI This is a 28 y.o. female who presents primarily for vaginal itching and burning. There is some cramping in lower abdomen at times, but she just finished her period following an 18 week SAB due to PPROM.  Did receive antibiotics after this and started symptoms after that.   RN Note: PT SAYS SHE WAS PREG - WAS AT [place] [redacted] WEEKS GESTATION , HAD SAB ON 12-23-2012. BLEEDING HEAVY IN NOV- GAVE HER A SHOT - BLEEDING SLOWED DOWN . STARTED BCP ON 12-6 THEN BLEEDING STOPPED. BLEEDING STARTED AGAIN ON 12-22- HEAVY . THEN ON 12-26- WAS IN [place]- VISITING- BLEEDING HEAVY- 3 PADS / HR.- SHE WENT TO ER- LABS OK- SHE CALLED [facility] WHILE SHE WAS IN [place]. HE SAID TAKE 3 BCP X7 DAYS- SHE DID. SO ON 12-27- BLEEDING STOPPED. THEN ON 12-29- SHE STARTED FEELING PAIN/ BURNING/ RED/ SWOLLEN IN VAGINA. YESTERDAY HAD WHITE/YELLOWISH D/C- BUT TODAY D/C IS GREENISH- WITH AN ODOR. FEELS CRAMPS- SOMETIMES IN HER BACK, VAGINA, LEGS.. IN ROOM 4 - NO BLEEDING . SHE SAYS DR TOLD HER SHE HAS FIBROIDS.       OB History   Grav Para Term Preterm Abortions TAB SAB Ect Mult Living   1    1  1          Past Medical History  Diagnosis Date  . Reflux   . UTI (lower urinary tract infection)   . Anxiety   . Constipation   . Gastritis     Past Surgical History  Procedure Laterality Date  . No past surgeries      Family History  Problem Relation Age of Onset  . Alcohol abuse Neg Hx   . Arthritis Neg Hx   . Asthma Neg Hx   . Birth defects Neg Hx   . Cancer Neg Hx   . Depression Neg Hx   . COPD Neg Hx   . Diabetes Neg Hx   . Drug abuse Neg Hx   . Early death Neg Hx   . Hearing loss Neg Hx   . Heart disease Neg Hx   . Hyperlipidemia Neg Hx   . Hypertension Neg Hx   . Kidney disease Neg Hx   . Learning  disabilities Neg Hx   . Mental illness Neg Hx   . Mental retardation Neg Hx   . Miscarriages / Stillbirths Neg Hx   . Stroke Neg Hx   . Vision loss Neg Hx     History  Substance Use Topics  . Smoking status: Never Smoker   . Smokeless tobacco: Not on file  . Alcohol Use: No    Allergies:  Allergies  Allergen Reactions  . Dilaudid [Hydromorphone Hcl] Anaphylaxis  . Morphine And Related     Prescriptions prior to admission  Medication Sig Dispense Refill  . FOLIC ACID PO Take 1 tablet by mouth daily.      . Multiple Vitamin (MULTIVITAMIN WITH MINERALS) TABS tablet Take 1 tablet by mouth daily.      . pantoprazole (PROTONIX) 20 MG tablet Take 1 tablet (20 mg total) by mouth daily.  30 tablet  0  . promethazine (PHENERGAN) 25 MG tablet Take 0.5-1 tablets (12.5-25 mg total) by mouth every 6 (six) hours as needed.  30 tablet  0    Review of Systems  Constitutional: Negative for fever and chills.  Gastrointestinal: Positive for abdominal pain (intermittent sharp pains). Negative for nausea, vomiting, diarrhea and constipation.  Genitourinary: Negative for dysuria.       Vaginal itching and burning   Physical Exam   Blood pressure 122/57, pulse 64, temperature 98.7 F (37.1 C), temperature source Oral, resp. rate 20, last menstrual period 08/15/2012, unknown if currently breastfeeding.  Physical Exam  Constitutional: She is oriented to person, place, and time. She appears well-developed and well-nourished. No distress.  HENT:  Head: Normocephalic.  Cardiovascular: Normal rate.   Respiratory: Effort normal.  GI: Soft. She exhibits no distension. There is tenderness (slight tenderness suprapubically). There is no rebound and no guarding.  Genitourinary: Uterus normal. Vaginal discharge (white discharge, mild erethema of vulva) found.  Musculoskeletal: Normal range of motion.  Neurological: She is alert and oriented to person, place, and time.  Skin: Skin is warm and dry.   Psychiatric: She has a normal mood and affect.   Results for orders placed during the hospital encounter of 02/12/13 (from the past 24 hour(s))  URINALYSIS, ROUTINE W REFLEX MICROSCOPIC     Status: Abnormal   Collection Time    02/12/13 10:20 PM      Result Value Range   Color, Urine YELLOW  YELLOW   APPearance CLEAR  CLEAR   Specific Gravity, Urine 1.020  1.005 - 1.030   pH 6.0  5.0 - 8.0   Glucose, UA NEGATIVE  NEGATIVE mg/dL   Hgb urine dipstick NEGATIVE  NEGATIVE   Bilirubin Urine NEGATIVE  NEGATIVE   Ketones, ur NEGATIVE  NEGATIVE mg/dL   Protein, ur NEGATIVE  NEGATIVE mg/dL   Urobilinogen, UA 0.2  0.0 - 1.0 mg/dL   Nitrite NEGATIVE  NEGATIVE   Leukocytes, UA MODERATE (*) NEGATIVE  URINE MICROSCOPIC-ADD ON     Status: Abnormal   Collection Time    02/12/13 10:20 PM      Result Value Range   Squamous Epithelial / LPF RARE  RARE   WBC, UA 7-10  <3 WBC/hpf   RBC / HPF 0-2  <3 RBC/hpf   Bacteria, UA FEW (*) RARE  POCT PREGNANCY, URINE     Status: None   Collection Time    02/12/13 10:33 PM      Result Value Range   Preg Test, Ur NEGATIVE  NEGATIVE  WET PREP, GENITAL     Status: Abnormal   Collection Time    02/12/13 11:01 PM      Result Value Range   Yeast Wet Prep HPF POC FEW (*) NONE SEEN   Trich, Wet Prep NONE SEEN  NONE SEEN   Clue Cells Wet Prep HPF POC NONE SEEN  NONE SEEN   WBC, Wet Prep HPF POC MANY (*) NONE SEEN    MAU Course  Procedures  MDM Long repeated explanations of what yeast vaginitis is.   Assessment and Plan  A:  Yeast Vaginitis P:  Rx Diflucan and Terazol 7       Followup with her doctor in [facility] (has appt)  Hansel Feinstein 02/13/2013, 12:31 AM

## 2013-02-13 NOTE — Discharge Instructions (Signed)
Vulvovaginitis Candidiásica  (Candidal Vulvovaginitis)  La vulvovaginitis candidiásica es una infección de la vagina y la vulva. La vulva es la piel que rodea la abertura de la vagina. Puede causar picazón y molestias dentro de y alrededor de la vagina.   CUIDADOS EN EL HOGAR  · Sólo tome medicamentos como lo indique su médico.  · No mantenga relaciones sexuales hasta que la infección haya curado o según le indique el médico.  · Practique sexo seguro.  · Informe a su compañero sexual acerca de su infección.  · No tome duchas vaginales ni use tampones.  · Use ropa interior de algodón. No utilice pantalones ni pantimedias ajustados.  · Coma yogur. Esto puede ayudar a tratar y prevenir las infecciones por cándida.  SOLICITE AYUDA DE INMEDIATO SI:   · Tiene fiebre.  · Los problemas empeoran durante el tratamiento, o si no mejora luego de 3 días.  · Tiene malestar, irritación, o picazón en la zona de la vagina o la vulva.  · Siente dolor en al mantener relaciones sexuales.  · Comienza a sentir dolor abdominal.  ASEGÚRESE DE QUE:  · Comprende estas instrucciones.  · Controlará su enfermedad.  · Solicitará ayuda de inmediato si no mejora o empeora.  Document Released: 03/02/2010 Document Revised: 04/22/2011  ExitCare® Patient Information ©2014 [employer].

## 2013-02-13 NOTE — MAU Provider Note (Signed)
Attestation of Attending Supervision of Advanced Practitioner (CNM/NP): Evaluation and management procedures were performed by the Advanced Practitioner under my supervision and collaboration.  I have reviewed the Advanced Practitioner's note and chart, and I agree with the management and plan.  Jaelynne Hockley 02/13/2013 6:28 AM

## 2013-02-14 LAB — GC/CHLAMYDIA PROBE AMP
CT PROBE, AMP APTIMA: NEGATIVE
GC PROBE AMP APTIMA: NEGATIVE

## 2013-02-14 LAB — URINE CULTURE: Colony Count: >=100000

## 2013-02-15 ENCOUNTER — Telehealth: Payer: Self-pay | Admitting: Obstetrics and Gynecology

## 2013-02-15 ENCOUNTER — Other Ambulatory Visit: Payer: Self-pay | Admitting: Obstetrics and Gynecology

## 2013-02-15 MED ORDER — CEPHALEXIN 500 MG PO CAPS: 500.0000 mg | ORAL_CAPSULE | Freq: Three times a day (TID) | ORAL | Status: DC

## 2013-02-15 NOTE — Telephone Encounter (Signed)
Pt's husband answered the phone call and is not with the patient at this time. He will call me back when he is with the patient in order to relay the message.

## 2013-02-15 NOTE — Telephone Encounter (Signed)
Informed patient of positive GBS in urine. Keflex sent to her pharmacy.

## 2013-02-28 ENCOUNTER — Inpatient Hospital Stay (HOSPITAL_COMMUNITY): Payer: Medicaid Other

## 2013-02-28 ENCOUNTER — Encounter (HOSPITAL_COMMUNITY): Payer: Self-pay | Admitting: *Deleted

## 2013-02-28 ENCOUNTER — Inpatient Hospital Stay (HOSPITAL_COMMUNITY)
Admission: AD | Admit: 2013-02-28 | Discharge: 2013-02-28 | Disposition: A | Discharge status: Home / Self Care | Payer: Medicaid Other | Attending: Obstetrics & Gynecology | Admitting: Obstetrics & Gynecology

## 2013-02-28 DIAGNOSIS — O034 Incomplete spontaneous abortion without complication: Secondary | ICD-10-CM

## 2013-02-28 DIAGNOSIS — IMO0002 Reserved for concepts with insufficient information to code with codable children: Secondary | ICD-10-CM | POA: Insufficient documentation

## 2013-02-28 DIAGNOSIS — O039 Complete or unspecified spontaneous abortion without complication: Secondary | ICD-10-CM

## 2013-02-28 HISTORY — DX: Benign neoplasm of connective and other soft tissue, unspecified: D21.9

## 2013-02-28 HISTORY — DX: Personal history of diseases of the blood and blood-forming organs and certain disorders involving the immune mechanism: Z86.2

## 2013-02-28 LAB — CBC
HCT: 37.7 % (ref 36.0–46.0)
Hemoglobin: 12.9 g/dL (ref 12.0–15.0)
MCH: 30.6 pg (ref 26.0–34.0)
MCHC: 34.2 g/dL (ref 30.0–36.0)
MCV: 89.5 fL (ref 78.0–100.0)
Platelets: 281 10*3/uL (ref 150–400)
RBC: 4.21 MIL/uL (ref 3.87–5.11)
RDW: 12.2 % (ref 11.5–15.5)
WBC: 6.7 10*3/uL (ref 4.0–10.5)

## 2013-02-28 LAB — URINALYSIS, ROUTINE W REFLEX MICROSCOPIC
BILIRUBIN URINE: NEGATIVE
GLUCOSE, UA: NEGATIVE mg/dL
Ketones, ur: NEGATIVE mg/dL
Nitrite: NEGATIVE
Protein, ur: NEGATIVE mg/dL
Urobilinogen, UA: 0.2 mg/dL (ref 0.0–1.0)
pH: 6.5 (ref 5.0–8.0)

## 2013-02-28 LAB — FIBRINOGEN: Fibrinogen: 371 mg/dL (ref 204–475)

## 2013-02-28 LAB — URINE MICROSCOPIC-ADD ON

## 2013-02-28 LAB — APTT: APTT: 28 s (ref 24–37)

## 2013-02-28 LAB — PROTIME-INR
INR: 1.04 (ref 0.00–1.49)
Prothrombin Time: 13.4 seconds (ref 11.6–15.2)

## 2013-02-28 LAB — POCT PREGNANCY, URINE: Preg Test, Ur: NEGATIVE

## 2013-02-28 MED ORDER — MEDROXYPROGESTERONE ACETATE 150 MG/ML IM SUSP
150.0000 mg | Freq: Once | INTRAMUSCULAR | Status: AC
  Administered 2013-02-28: 150 mg via INTRAMUSCULAR
  Filled 2013-02-28: qty 1

## 2013-02-28 MED ORDER — KETOROLAC TROMETHAMINE 60 MG/2ML IM SOLN
60.0000 mg | Freq: Once | INTRAMUSCULAR | Status: DC
  Filled 2013-02-28: qty 2

## 2013-02-28 MED ORDER — KETOROLAC TROMETHAMINE 60 MG/2ML IM SOLN
60.0000 mg | Freq: Once | INTRAMUSCULAR | Status: AC
  Administered 2013-02-28: 60 mg via INTRAMUSCULAR

## 2013-02-28 NOTE — MAU Note (Signed)
Pt presents with complaints of bright red vaginal bleeding that started in November after she lost her baby but the bleeding has gotten worse over the last week. Pt states that she has pain in her lower abdomen and lower back

## 2013-02-28 NOTE — Discharge Instructions (Signed)
Dilatacin y curetaje o curetaje por aspiracin  (Dilation and Curettage or Vacuum Curettage)  La dilatacin y el curetaje por aspiracin son procedimientos menores. Consiste en la apertura (dilatacin) del cuello del tero y el raspado (curetaje) en la superficie interna del tero. Durante este procedimiento, el tejido del interior del tero se raspa suavemente. Durante el curetaje por aspiracin, se utiliza una succin suave para extirpar tejidos del tero.   El curetaje podr realizarse para diagnstico o para tratar un problema. Como procedimiento diagnstico, se realiza para examinar los tejidos del tero. Un diagnstico con curetaje se realiza en caso de presentarse los siguientes sntomas:    Sangrado irregular en el tero.   Hemorragias con cogulos.   Prdida de sangre entre los perodos menstruales.   Perodos menstruales prolongados.   Sangrado luego de la menopausia.   Falta de periodo menstrual (amenorrea).   Cambio en el tamao y forma del tero.  Como tratamiento, el curetaje podr realizarse por las siguientes causas:    Remocin del DIU (dispositivo intrauterino).   Remocin de placenta retenida luego del parto. La placenta retenida puede causar una hemorragia lo suficientemente grave como para requerir transfusiones o puede provocar una infeccin.   Aborto.   Aborto espontneo.   Extirpacin de plipos en el interior del tero.   Extirpacin de fibromas no frecuentes (bultos no cancerosos).  INFORME A SU MDICO:    Cualquier alergia que tenga.   Todos los medicamentos que utiliza, incluyendo vitaminas, hierbas, gotas oftlmicas, cremas y medicamentos de venta libre.   Problemas previos que usted o los miembros de su familia hayan tenido con el uso de anestsicos.   Enfermedades de la sangre.   Cirugas previas.   Padecimientos mdicos.  RIESGOS Y COMPLICACIONES   Generalmente es un procedimiento seguro. Sin embargo, como en cualquier procedimiento, pueden surgir complicaciones.  Las complicaciones posibles son:   Sangrado excesivo.   Infeccin en el tero.   Lesin en el cuello del tero.   Desarrollo de tejido cicatrizal (adherencias) dentro del tero que posteriormente causan hemorragia menstrual en cantidad anormal.   Complicaciones de la anestesia general, si se ha usado.   Perforacin del tero. Esto es raro.  ANTES DEL PROCEDIMIENTO    Coma y beba antes del procedimiento slo lo que le indique el mdico.   Arregle con alguna persona para que la lleve a su casa.  PROCEDIMIENTO   El procedimiento demora entre 15 y 30 minutos.   Podrn administrarle uno de los siguientes medicamentos:   Medicamentos que adormecen el rea del cuello uterino (anestesia local).   Un medicamento para que duerma durante el procedimiento (anestesia general).   Deber recostarse sobre la espalda con las piernas en los estribos.   Le colocarn en la vagina un instrumento metlico o plstico entibiado espculo) para mantenerla abierta y permitir al profesional visualizar el cuello del tero.   Hay dos formas en las que el cuello del tero puede ser ablandado y dilatado. Pueden ser:   Tomar medicamentos.   Insercin de unas varillas delgadas (laminarias) en el cuello del tero.   Se utilizar un instrumento curvo (cureta)para raspar las clulas de la membrana que cubre el interior del tero. En algunos casos, se aplica una succin suave con la cureta. Luego la cureta se retirar.  DESPUS DEL PROCEDIMIENTO    Descansar en una sala de recuperacin hasta que se sienta estable y lista para volver a su casa.   Podr tener nuseas o vmitos si le han   administrado anestesia general.   Sentir dolor de garganta si le han colocado un tubo durante la anestesia general.   Podr sentir algunos clicos y tener una pequea hemorragia. Estas molestias pueden durar entre 2 das y 2 semanas despus del procedimiento.   Luego del procedimiento, el tero formar una nueva cubierta. Esto puede hacer que el  prximo perodo se retrase.  Document Released: 07/17/2007 Document Revised: 09/30/2012  ExitCare Patient Information 2014 [employer].

## 2013-02-28 NOTE — MAU Provider Note (Signed)
History     CSN: [id number]  Arrival date and time: 02/28/13 1449   First Provider Initiated Contact with Patient 02/28/13 1807      Chief Complaint  Patient presents with  . Vaginal Bleeding   HPI Comments: ZAMZAM WHINERY 28 y.o. [id number] presents to MAU with ongoing bleeding since November. In November she had a PROM and miscarried at 11 weeks. Since then she has been seen multiple times with her doctor in [facility] Dr Bethann Goo. She was also seen in ER in [place] on a visit there. She has been given birth control pills 4 tabs x 1 week then daily. The pills have helped to slow down bleeding but not stopped. She remembers as a child that she had difficulty stopping her bleeding and her menses are always a problem. She is a Jehovah Witness and is very afraid of loosing so much blood that she will require transfusion.      Vaginal Bleeding     Past Medical History  Diagnosis Date  . Reflux   . UTI (lower urinary tract infection)   . Anxiety   . Constipation   . Gastritis   . Fibroid   . History of bleeding disorder as a child     Past Surgical History  Procedure Laterality Date  . No past surgeries      Family History  Problem Relation Age of Onset  . Alcohol abuse Neg Hx   . Arthritis Neg Hx   . Asthma Neg Hx   . Birth defects Neg Hx   . Cancer Neg Hx   . Depression Neg Hx   . COPD Neg Hx   . Diabetes Neg Hx   . Drug abuse Neg Hx   . Early death Neg Hx   . Hearing loss Neg Hx   . Heart disease Neg Hx   . Hyperlipidemia Neg Hx   . Hypertension Neg Hx   . Kidney disease Neg Hx   . Learning disabilities Neg Hx   . Mental illness Neg Hx   . Mental retardation Neg Hx   . Miscarriages / Stillbirths Neg Hx   . Stroke Neg Hx   . Vision loss Neg Hx     History  Substance Use Topics  . Smoking status: Never Smoker   . Smokeless tobacco: Not on file  . Alcohol Use: No    Allergies:  Allergies  Allergen Reactions  . Dilaudid [Hydromorphone Hcl] Anaphylaxis   . Morphine And Related Shortness Of Breath and Nausea And Vomiting    Prescriptions prior to admission  Medication Sig Dispense Refill  . cephALEXin (KEFLEX) 500 MG capsule Take 1 capsule (500 mg total) by mouth 3 (three) times daily.  21 capsule  0  . Damiana, Turnera diffusa, (DAMIANA LEAF PO) Take 1 tablet by mouth daily.      . fluconazole (DIFLUCAN) 150 MG tablet Take 1 tablet (150 mg total) by mouth once.  1 tablet  0  . FOLIC ACID PO Take 1 tablet by mouth daily.      Marland Kitchen OVER THE COUNTER MEDICATION Take 1 capsule by mouth daily. Red raspberry      . vitamin E 400 UNIT capsule Take 400 Units by mouth daily.        Review of Systems  Constitutional: Negative.   HENT: Negative.   Eyes: Negative.   Respiratory: Negative.   Cardiovascular: Negative.   Gastrointestinal: Negative.   Genitourinary: Positive for vaginal bleeding.  Heavy vaginal bleeding since November  Musculoskeletal: Negative.   Skin: Negative.   Neurological: Negative.   Endo/Heme/Allergies: Negative.   Psychiatric/Behavioral: Negative.    Physical Exam   Blood pressure 145/93, pulse 76, temperature 97.8 F (36.6 C), temperature source Oral, resp. rate 16, height 5\' 3"  (1.6 m), weight 68.04 kg (150 lb), last menstrual period 01/31/2013, not currently breastfeeding.  Physical Exam  Constitutional: She is oriented to person, place, and time. She appears well-developed and well-nourished. No distress.  HENT:  Head: Normocephalic and atraumatic.  Eyes: Pupils are equal, round, and reactive to light.  Cardiovascular: Normal rate, regular rhythm and normal heart sounds.   Respiratory: Effort normal and breath sounds normal.  GI: Soft. There is tenderness.  Genitourinary:  Genital: External: bloody Vaginal:Moderate amount blood in vault Cervix: closed Bimanual: tender uterus   Musculoskeletal: Normal range of motion.  Neurological: She is alert and oriented to person, place, and time.  Skin: Skin is  warm and dry.  Psychiatric: She has a normal mood and affect. Her behavior is normal. Judgment and thought content normal.   Results for orders placed during the hospital encounter of 02/28/13 (from the past 24 hour(s))  URINALYSIS, ROUTINE W REFLEX MICROSCOPIC     Status: Abnormal   Collection Time    02/28/13  2:55 PM      Result Value Range   Color, Urine RED (*) YELLOW   APPearance CLEAR  CLEAR   Specific Gravity, Urine <1.005 (*) 1.005 - 1.030   pH 6.5  5.0 - 8.0   Glucose, UA NEGATIVE  NEGATIVE mg/dL   Hgb urine dipstick LARGE (*) NEGATIVE   Bilirubin Urine NEGATIVE  NEGATIVE   Ketones, ur NEGATIVE  NEGATIVE mg/dL   Protein, ur NEGATIVE  NEGATIVE mg/dL   Urobilinogen, UA 0.2  0.0 - 1.0 mg/dL   Nitrite NEGATIVE  NEGATIVE   Leukocytes, UA TRACE (*) NEGATIVE  URINE MICROSCOPIC-ADD ON     Status: None   Collection Time    02/28/13  2:55 PM      Result Value Range   Squamous Epithelial / LPF RARE  RARE   WBC, UA 0-2  <3 WBC/hpf   RBC / HPF TOO NUMEROUS TO COUNT  <3 RBC/hpf   Urine-Other MUCOUS PRESENT    POCT PREGNANCY, URINE     Status: None   Collection Time    02/28/13  3:51 PM      Result Value Range   Preg Test, Ur NEGATIVE  NEGATIVE  CBC     Status: None   Collection Time    02/28/13  5:35 PM      Result Value Range   WBC 6.7  4.0 - 10.5 K/uL   RBC 4.21  3.87 - 5.11 MIL/uL   Hemoglobin 12.9  12.0 - 15.0 g/dL   HCT 37.7  36.0 - 46.0 %   MCV 89.5  78.0 - 100.0 fL   MCH 30.6  26.0 - 34.0 pg   MCHC 34.2  30.0 - 36.0 g/dL   RDW 12.2  11.5 - 15.5 %   Platelets 281  150 - 400 K/uL    US Transvaginal Non-ob  02/28/2013   CLINICAL DATA:  Spontaneous abortion 12/2012.  Persistent bleeding.  EXAM: TRANSVAGINAL ULTRASOUND OF PELVIS  TECHNIQUE: Transvaginal ultrasound examination of the pelvis was performed including evaluation of the uterus, ovaries, adnexal regions, and pelvic cul-de-sac.  COMPARISON:  Pelvic ultrasound 12/24/2012.  FINDINGS: Uterus  Measurements: 7.2 x  3.9 x  5.4 cm, within normal limits. No fibroids or other mass visualized.  Endometrium  Thickness: The endometrium within the fundus the uterus is normal in size, measuring 4.1 mm. A focal echogenic lesion is present near the upper cervix measuring 1.2 x 0.6 x 0.7 cm. There is increased vascularity to this area.  Right ovary  Measurements: 3.9 x 1.5 x 2.2 cm, within normal limits. Normal appearance/no adnexal mass.  Left ovary  Measurements: 3.6 x 1.6 x 1.9 cm, within normal limits. Normal appearance/no adnexal mass.  Other findings:  Trace free fluid is likely physiologic.  IMPRESSION: 1. 1.2 cm masslike lesion in the upper cervix with vascularity evident on color Doppler imaging. This raises concern for retained products of conception. An endometrial polyp is considered less likely an was not present on the early pregnancy scans. 2. In the uterine fundus is within normal limits. 3. Normal appearance of the ovaries. 4. Trace free fluid is likely physiologic.   Electronically Signed   By: Lawrence Santiago M.D.   On: 02/28/2013 19:49    MAU Course  Procedures  MDM  Toradol 60 mg IM for pain Pelvic ultrasound CBC Spoke with Dr Gala Romney and she advised appointment at [facility] [place] panel, PT, PTT, Fibrinogen Assessment and Plan   A: ? Retained products of conception ? Clotting Disorder  P: Depoprovera 150 mg IM now Make an appointment with [facility] tomorrow Will call office and have them schedule for [facility] Order above labs  Georgia Duff 02/28/2013, 8:26 PM

## 2013-03-02 ENCOUNTER — Encounter (HOSPITAL_COMMUNITY): Payer: Self-pay

## 2013-03-02 ENCOUNTER — Encounter: Payer: Self-pay | Admitting: Obstetrics & Gynecology

## 2013-03-02 ENCOUNTER — Ambulatory Visit (INDEPENDENT_AMBULATORY_CARE_PROVIDER_SITE_OTHER): Payer: Medicaid Other | Admitting: Obstetrics & Gynecology

## 2013-03-02 ENCOUNTER — Encounter (HOSPITAL_COMMUNITY): Payer: Self-pay | Admitting: Pharmacist

## 2013-03-02 VITALS — BP 126/78 | HR 67 | Ht 63.0 in | Wt 151.0 lb

## 2013-03-02 DIAGNOSIS — IMO0002 Reserved for concepts with insufficient information to code with codable children: Secondary | ICD-10-CM | POA: Insufficient documentation

## 2013-03-02 DIAGNOSIS — R102 Pelvic and perineal pain: Secondary | ICD-10-CM

## 2013-03-02 DIAGNOSIS — G8929 Other chronic pain: Secondary | ICD-10-CM

## 2013-03-02 DIAGNOSIS — N803 Endometriosis of pelvic peritoneum: Secondary | ICD-10-CM | POA: Insufficient documentation

## 2013-03-02 DIAGNOSIS — N949 Unspecified condition associated with female genital organs and menstrual cycle: Secondary | ICD-10-CM

## 2013-03-02 DIAGNOSIS — N938 Other specified abnormal uterine and vaginal bleeding: Secondary | ICD-10-CM

## 2013-03-02 NOTE — Progress Notes (Signed)
   Subjective:    Patient ID: Monica Phelps, female    DOB: 1985-06-12, 28 y.o.   MRN: [id number]  HPI 28 yo [place] who is here today with 2 major problems. 1) DUB since menarche at 28yo. She describes them as heavy and she has had multiple MAU visits for this issue. An u/s done yesterday shows an area that is possibly retained products. She has tried OCPs (taper dose currently), she had a depo provera injection last yesterday. She still had BTB even on the OCPs.  She also says the periods are very painful, with radiation to anus, lower back. She also has constipation and dyschezia. Some hematozhezia and had a hospital admission in [facility] for hemorrhoids at 28 yo. She also reports dyspareunia.   Review of Systems Von Willebrand's panel is pending. Cervical cultures were negative twice.    Objective:   Physical Exam        Assessment & Plan:  CPP and DUB- probable endometriosis. I will proceed with d&c and diagnostic laparoscopy.  She understands the risks of surgery, including, but not to infection, bleeding, DVTs, damage to bowel, bladder, ureters. She wishes to proceed. 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

## 2013-03-03 ENCOUNTER — Ambulatory Visit (HOSPITAL_COMMUNITY): Payer: Medicaid Other | Admitting: Anesthesiology

## 2013-03-03 ENCOUNTER — Encounter (HOSPITAL_COMMUNITY): Payer: Medicaid Other | Admitting: Anesthesiology

## 2013-03-03 ENCOUNTER — Ambulatory Visit (HOSPITAL_COMMUNITY)
Admission: RE | Admit: 2013-03-03 | Discharge: 2013-03-03 | Disposition: A | Discharge status: Home / Self Care | Payer: Medicaid Other | Source: Ambulatory Visit | Attending: Obstetrics & Gynecology | Admitting: Obstetrics & Gynecology

## 2013-03-03 ENCOUNTER — Encounter (HOSPITAL_COMMUNITY)
Admission: RE | Disposition: A | Discharge status: Home / Self Care | Payer: Self-pay | Source: Ambulatory Visit | Attending: Obstetrics & Gynecology

## 2013-03-03 DIAGNOSIS — N949 Unspecified condition associated with female genital organs and menstrual cycle: Secondary | ICD-10-CM | POA: Insufficient documentation

## 2013-03-03 DIAGNOSIS — IMO0002 Reserved for concepts with insufficient information to code with codable children: Secondary | ICD-10-CM | POA: Insufficient documentation

## 2013-03-03 DIAGNOSIS — K5900 Constipation, unspecified: Secondary | ICD-10-CM | POA: Insufficient documentation

## 2013-03-03 DIAGNOSIS — N803 Endometriosis of pelvic peritoneum, unspecified: Secondary | ICD-10-CM | POA: Insufficient documentation

## 2013-03-03 DIAGNOSIS — R102 Pelvic and perineal pain: Secondary | ICD-10-CM

## 2013-03-03 DIAGNOSIS — N925 Other specified irregular menstruation: Secondary | ICD-10-CM | POA: Insufficient documentation

## 2013-03-03 DIAGNOSIS — G8929 Other chronic pain: Secondary | ICD-10-CM | POA: Insufficient documentation

## 2013-03-03 DIAGNOSIS — N938 Other specified abnormal uterine and vaginal bleeding: Secondary | ICD-10-CM | POA: Insufficient documentation

## 2013-03-03 HISTORY — DX: Disease of blood and blood-forming organs, unspecified: D75.9

## 2013-03-03 HISTORY — DX: Unspecified asthma, uncomplicated: J45.909

## 2013-03-03 HISTORY — PX: LAPAROSCOPIC LYSIS OF ADHESIONS: SHX5905

## 2013-03-03 HISTORY — PX: LAPAROSCOPY: SHX197

## 2013-03-03 HISTORY — PX: DILATION AND CURETTAGE OF UTERUS: SHX78

## 2013-03-03 HISTORY — DX: Headache: R51

## 2013-03-03 HISTORY — DX: Gastro-esophageal reflux disease without esophagitis: K21.9

## 2013-03-03 LAB — PREGNANCY, URINE: Preg Test, Ur: NEGATIVE

## 2013-03-03 LAB — TSH: TSH: 1.623 u[IU]/mL (ref 0.350–4.500)

## 2013-03-03 LAB — VON WILLEBRAND PANEL
COAGULATION FACTOR VIII: 142 % — AB (ref 73–140)
RISTOCETIN CO-FACTOR, PLASMA: 101 % (ref 42–200)
Von Willebrand Antigen, Plasma: 105 % (ref 50–217)

## 2013-03-03 SURGERY — DILATION AND CURETTAGE
Anesthesia: General | Site: Vagina

## 2013-03-03 MED ORDER — NEOSTIGMINE METHYLSULFATE 1 MG/ML IJ SOLN
INTRAMUSCULAR | Status: DC | PRN
  Administered 2013-03-03: 2.5 mg via INTRAVENOUS

## 2013-03-03 MED ORDER — BUPIVACAINE HCL (PF) 0.5 % IJ SOLN
INTRAMUSCULAR | Status: AC
  Filled 2013-03-03: qty 30

## 2013-03-03 MED ORDER — IBUPROFEN 800 MG PO TABS: 800.0000 mg | ORAL_TABLET | Freq: Three times a day (TID) | ORAL | Status: DC | PRN

## 2013-03-03 MED ORDER — ROCURONIUM BROMIDE 100 MG/10ML IV SOLN
INTRAVENOUS | Status: DC | PRN
  Administered 2013-03-03: 25 mg via INTRAVENOUS
  Administered 2013-03-03: 5 mg via INTRAVENOUS

## 2013-03-03 MED ORDER — MIDAZOLAM HCL 2 MG/2ML IJ SOLN
INTRAMUSCULAR | Status: AC
  Filled 2013-03-03: qty 2

## 2013-03-03 MED ORDER — LIDOCAINE HCL (CARDIAC) 20 MG/ML IV SOLN
INTRAVENOUS | Status: AC
  Filled 2013-03-03: qty 5

## 2013-03-03 MED ORDER — BUPIVACAINE HCL 0.5 % IJ SOLN
INTRAMUSCULAR | Status: DC | PRN
  Administered 2013-03-03: 20 mL

## 2013-03-03 MED ORDER — FENTANYL CITRATE 0.05 MG/ML IJ SOLN
25.0000 ug | INTRAMUSCULAR | Status: DC | PRN
  Administered 2013-03-03: 50 ug via INTRAVENOUS

## 2013-03-03 MED ORDER — NEOSTIGMINE METHYLSULFATE 1 MG/ML IJ SOLN
INTRAMUSCULAR | Status: AC
  Filled 2013-03-03: qty 1

## 2013-03-03 MED ORDER — ONDANSETRON HCL 4 MG/2ML IJ SOLN
INTRAMUSCULAR | Status: DC | PRN
Start: 2013-03-03 — End: 2013-03-03
  Administered 2013-03-03: 4 mg via INTRAVENOUS

## 2013-03-03 MED ORDER — LIDOCAINE HCL (CARDIAC) 20 MG/ML IV SOLN
INTRAVENOUS | Status: DC | PRN
  Administered 2013-03-03: 80 mg via INTRAVENOUS

## 2013-03-03 MED ORDER — PROPOFOL 10 MG/ML IV EMUL
INTRAVENOUS | Status: AC
  Filled 2013-03-03: qty 20

## 2013-03-03 MED ORDER — MIDAZOLAM HCL 2 MG/2ML IJ SOLN
INTRAMUSCULAR | Status: DC | PRN
  Administered 2013-03-03: 2 mg via INTRAVENOUS

## 2013-03-03 MED ORDER — ROCURONIUM BROMIDE 100 MG/10ML IV SOLN
INTRAVENOUS | Status: AC
  Filled 2013-03-03: qty 1

## 2013-03-03 MED ORDER — FENTANYL CITRATE 0.05 MG/ML IJ SOLN
INTRAMUSCULAR | Status: AC
  Filled 2013-03-03: qty 2

## 2013-03-03 MED ORDER — FENTANYL CITRATE 0.05 MG/ML IJ SOLN
INTRAMUSCULAR | Status: DC | PRN
  Administered 2013-03-03 (×3): 50 ug via INTRAVENOUS
  Administered 2013-03-03: 100 ug via INTRAVENOUS

## 2013-03-03 MED ORDER — FENTANYL CITRATE 0.05 MG/ML IJ SOLN
INTRAMUSCULAR | Status: AC
  Filled 2013-03-03: qty 5

## 2013-03-03 MED ORDER — CLONAZEPAM 0.5 MG PO TABS
0.5000 mg | ORAL_TABLET | Freq: Once | ORAL | Status: AC
  Administered 2013-03-03: 0.5 mg via ORAL
  Filled 2013-03-03: qty 1

## 2013-03-03 MED ORDER — LACTATED RINGERS IV SOLN
INTRAVENOUS | Status: DC
  Administered 2013-03-03 (×2): via INTRAVENOUS

## 2013-03-03 MED ORDER — GLYCOPYRROLATE 0.2 MG/ML IJ SOLN
INTRAMUSCULAR | Status: AC
  Filled 2013-03-03: qty 3

## 2013-03-03 MED ORDER — GLYCOPYRROLATE 0.2 MG/ML IJ SOLN
INTRAMUSCULAR | Status: DC | PRN
  Administered 2013-03-03: .5 mg via INTRAVENOUS
  Administered 2013-03-03: 0.1 mg via INTRAVENOUS

## 2013-03-03 MED ORDER — BUPIVACAINE HCL (PF) 0.5 % IJ SOLN
INTRAMUSCULAR | Status: DC | PRN
  Administered 2013-03-03: 10 mL

## 2013-03-03 MED ORDER — DEXAMETHASONE SODIUM PHOSPHATE 10 MG/ML IJ SOLN
INTRAMUSCULAR | Status: DC | PRN
  Administered 2013-03-03: 10 mg via INTRAVENOUS

## 2013-03-03 MED ORDER — DEXAMETHASONE SODIUM PHOSPHATE 10 MG/ML IJ SOLN
INTRAMUSCULAR | Status: AC
  Filled 2013-03-03: qty 1

## 2013-03-03 MED ORDER — DOXYCYCLINE HYCLATE 100 MG IV SOLR
100.0000 mg | Freq: Once | INTRAVENOUS | Status: AC
  Administered 2013-03-03: 100 mg via INTRAVENOUS
  Filled 2013-03-03: qty 100

## 2013-03-03 MED ORDER — PROPOFOL 10 MG/ML IV BOLUS
INTRAVENOUS | Status: DC | PRN
  Administered 2013-03-03: 140 mg via INTRAVENOUS

## 2013-03-03 MED ORDER — OXYCODONE-ACETAMINOPHEN 5-325 MG PO TABS: 1.0000 | ORAL_TABLET | ORAL | Status: DC | PRN

## 2013-03-03 MED ORDER — ONDANSETRON HCL 4 MG/2ML IJ SOLN
INTRAMUSCULAR | Status: AC
  Filled 2013-03-03: qty 2

## 2013-03-03 SURGICAL SUPPLY — 36 items
APPLICATOR COTTON TIP 6IN STRL (MISCELLANEOUS) ×3 IMPLANT
CABLE HIGH FREQUENCY MONO STRZ (ELECTRODE) ×3 IMPLANT
CHLORAPREP W/TINT 26ML (MISCELLANEOUS) ×3 IMPLANT
CLOTH BEACON ORANGE TIMEOUT ST (SAFETY) ×3 IMPLANT
CONTAINER PREFILL 10% NBF 15ML (MISCELLANEOUS) ×6 IMPLANT
CONTAINER PREFILL 10% NBF 60ML (FORM) ×3 IMPLANT
DILATOR CANAL MILEX (MISCELLANEOUS) IMPLANT
DRSG TELFA 3X8 NADH (GAUZE/BANDAGES/DRESSINGS) ×3 IMPLANT
DURAPREP 26ML APPLICATOR (WOUND CARE) IMPLANT
ELECT REM PT RETURN 9FT ADLT (ELECTROSURGICAL)
ELECTRODE REM PT RTRN 9FT ADLT (ELECTROSURGICAL) IMPLANT
GLOVE BIO SURGEON STRL SZ 6.5 (GLOVE) ×3 IMPLANT
GOWN PREVENTION PLUS LG XLONG (DISPOSABLE) ×6 IMPLANT
GOWN STRL REIN XL XLG (GOWN DISPOSABLE) ×6 IMPLANT
NDL SAFETY ECLIPSE 18X1.5 (NEEDLE) ×2 IMPLANT
NEEDLE HYPO 18GX1.5 SHARP (NEEDLE) ×1
NEEDLE INSUFFLATION 120MM (ENDOMECHANICALS) ×3 IMPLANT
NEEDLE SPNL 18GX3.5 QUINCKE PK (NEEDLE) ×3 IMPLANT
NS IRRIG 1000ML POUR BTL (IV SOLUTION) ×3 IMPLANT
PACK LAPAROSCOPY BASIN (CUSTOM PROCEDURE TRAY) ×3 IMPLANT
PACK VAGINAL MINOR WOMEN LF (CUSTOM PROCEDURE TRAY) ×3 IMPLANT
PAD OB MATERNITY 4.3X12.25 (PERSONAL CARE ITEMS) ×3 IMPLANT
PAD PREP 24X48 CUFFED NSTRL (MISCELLANEOUS) ×3 IMPLANT
POUCH SPECIMEN RETRIEVAL 10MM (ENDOMECHANICALS) IMPLANT
PROTECTOR NERVE ULNAR (MISCELLANEOUS) ×6 IMPLANT
SET IRRIG TUBING LAPAROSCOPIC (IRRIGATION / IRRIGATOR) ×3 IMPLANT
STRIP CLOSURE SKIN 1/2X4 (GAUZE/BANDAGES/DRESSINGS) ×3 IMPLANT
SUT VICRYL 0 ENDOLOOP (SUTURE) IMPLANT
SUT VICRYL 0 UR6 27IN ABS (SUTURE) ×6 IMPLANT
SUT VICRYL 4-0 PS2 18IN ABS (SUTURE) ×6 IMPLANT
SYR CONTROL 10ML LL (SYRINGE) ×3 IMPLANT
TOWEL OR 17X24 6PK STRL BLUE (TOWEL DISPOSABLE) ×6 IMPLANT
TROCAR XCEL NON-BLD 11X100MML (ENDOMECHANICALS) ×3 IMPLANT
TROCAR XCEL NON-BLD 5MMX100MML (ENDOMECHANICALS) ×6 IMPLANT
WARMER LAPAROSCOPE (MISCELLANEOUS) ×3 IMPLANT
WATER STERILE IRR 1000ML POUR (IV SOLUTION) IMPLANT

## 2013-03-03 NOTE — Transfer of Care (Signed)
Immediate Anesthesia Transfer of Care Note  Patient: Monica Phelps  Procedure(s) Performed: Procedure(s): DILATATION AND CURETTAGE (N/A) LAPAROSCOPY DIAGNOSTIC (N/A)  Patient Location: PACU  Anesthesia Type:General  Level of Consciousness: awake, alert  and oriented  Airway & Oxygen Therapy: Patient Spontanous Breathing and Patient connected to nasal cannula oxygen  Post-op Assessment: Report given to PACU RN and Post -op Vital signs reviewed and stable  Post vital signs: Reviewed and stable  Complications: No apparent anesthesia complications

## 2013-03-03 NOTE — H&P (Signed)
Monica Phelps is a 28 yo MH [id number] who is here today with 2 major problems. 1) DUB since menarche at 28yo. She describes them as heavy and she has had multiple MAU visits for this issue. An u/s done yesterday shows an area that is possibly retained products. She has tried OCPs (taper dose currently), she had a depo provera injection last yesterday. She still had BTB even on the OCPs.  She also says the periods are very painful, with radiation to anus, lower back. She also has constipation and dyschezia. Some hematozhezia and had a hospital admission in [facility] for hemorrhoids at 28 yo. She also reports dyspareunia.  Review of Systems  Von Willebrand's panel is pending.  Cervical cultures were negative twice.   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 I have notified the pharmacy of this situation and that her Von Willebrand's panel is still pending.      Menstrual History:  Patient's last menstrual period was 01/31/2013.    Past Medical History  Diagnosis Date  . Reflux   . UTI (lower urinary tract infection)   . Constipation   . Gastritis   . Fibroid   . History of bleeding disorder as a child   . Diabetes mellitus without complication   . Asthma   . Headache(784.0)     migraines  . GERD (gastroesophageal reflux disease)   . Anxiety     paniac  . Blood dyscrasia     pt states hemorrhaged with last period 12/14 and was hospitalized in [place]    Past Surgical History  Procedure Laterality Date  . No past surgeries    . Dilation and curettage of uterus    . Induced abortion      at 5 months fetal anomalies hemorrhaged afterwards    Family History  Problem Relation Age of Onset  . Alcohol abuse Neg Hx   . Arthritis Neg Hx   . Asthma Neg Hx   . Birth defects Neg Hx   . Cancer Neg Hx   . Depression Neg Hx   . COPD Neg Hx   . Diabetes Neg Hx   . Drug abuse Neg Hx   . Early death Neg Hx   . Hearing loss Neg Hx   . Heart disease Neg Hx   .  Hyperlipidemia Neg Hx   . Hypertension Neg Hx   . Kidney disease Neg Hx   . Learning disabilities Neg Hx   . Mental illness Neg Hx   . Mental retardation Neg Hx   . Miscarriages / Stillbirths Neg Hx   . Stroke Neg Hx   . Vision loss Neg Hx     Social History:  reports that she has never smoked. She has never used smokeless tobacco. She reports that she does not drink alcohol or use illicit drugs.  Allergies:  Allergies  Allergen Reactions  . Dilaudid [Hydromorphone Hcl] Anaphylaxis and Nausea And Vomiting  . Morphine And Related Shortness Of Breath and Nausea And Vomiting    No prescriptions prior to admission    ROS  Last menstrual period 01/31/2013. Physical Exam Heart- rrr Lungs- CTAB Abd- benign  Results for orders placed in visit on 03/02/13 (from the past 24 hour(s))  TSH     Status: None   Collection Time    03/02/13  1:39 PM      Result Value Range   TSH 1.623  0.350 - 4.500 uIU/mL   Narrative:  Performed at:  [facility], [address]                [place], [place] [address]    No results found.  Assessment/Plan: 1) Chronic pelvic pain. I strongly suspect endometriosis. She and her husband are adament that they want a diagnostic laparoscopy to truly diagnose her pain etiology.  She understands the risks of surgery, including, but not to infection, bleeding, DVTs, damage to bowel, bladder, ureters. She wishes to proceed.  2) DUB- I have offered her a d&c. The u/s is suspicious for retained POC. However, her DUB was also present prior to her fetal loss.    Lamarcus Spira C. 03/03/2013, 9:14 AM

## 2013-03-03 NOTE — OR Nursing (Signed)
Added to the allergies list.  Also see notes on betadine.

## 2013-03-03 NOTE — Anesthesia Postprocedure Evaluation (Signed)
  Anesthesia Post-op Note  Patient: Monica Phelps  Procedure(s) Performed: Procedure(s): DILATATION AND CURETTAGE (N/A) LAPAROSCOPY DIAGNOSTIC (N/A) LAPAROSCOPIC LYSIS OF ADHESIONS (N/A)  Patient Location: PACU  Anesthesia Type:General  Level of Consciousness: awake, alert  and oriented  Airway and Oxygen Therapy: Patient Spontanous Breathing  Post-op Pain: mild  Post-op Assessment: Post-op Vital signs reviewed, Patient's Cardiovascular Status Stable, Respiratory Function Stable, Patent Airway, No signs of Nausea or vomiting and Pain level controlled  Post-op Vital Signs: Reviewed and stable  Complications: No apparent anesthesia complications

## 2013-03-03 NOTE — Anesthesia Preprocedure Evaluation (Signed)

## 2013-03-03 NOTE — Discharge Instructions (Signed)
Dilatacin y curetaje o curetaje por aspiracin - Cuidados posteriores (Dilation and Curettage or Vacuum Curettage, Care After) Estas indicaciones le proporcionan informacin general acerca de cmo deber cuidarse despus del procedimiento. El mdico tambin podr darle instrucciones especficas. Comunquese con el mdico si tiene algn problema o tiene preguntas despus del procedimiento. CUIDADOS EN EL HOGAR  No conduzca vehculos por 24 horas.  Espere 1 semana antes de realizar [employer] la [employer].  Troy (2) veces al da, durante 4 [place]. Antelas. Comunquese con su mdico si tiene fiebre.  No permanezca de pie durante mucho tiempo.  No levante, empuje o jale objetos de ms de 10 libras (4,5 kilogramos).  Slo suba escaleras una o dos veces por da.  Descanse con frecuencia.  Siga con su dieta habitual.  Beba gran cantidad de lquido para mantener el pis (orina) de tono claro o amarillo plido.  Si tiene dificultad para mover el intestino (constipacin), usted podr:  Tomar algn medicamento (laxante) que la ayude a Film/video editor intestino.  Consumir ms fruta y salvado.  Beber ms lquidos.  Tomar una ducha, no un bao de inmersin, durante el tiempo que le indique su mdico.  No practique natacin ni use el jacuzzi hasta que el mdico la autorice.  Pdale a alguien que se quede con usted durante 1-2 [employer] del procedimiento.  No haga duchas vaginales, no use tampones ni tenga sexo (relaciones sexuales) durante al menos 2 semanas o segn las indicaciones.  Tome slo los medicamentos segn le haya indicado el mdico.No tome aspirina. Puede ocasionar hemorragias.  Cumpla con los controles mdicos segn las indicaciones. SOLICITE AYUDA SI:  Tiene clicos o siente un dolor que no puede ser Microsoft.  Sentir dolor en la zona baja del vientre (abdomen).  Advierte un olor ftido que proviene de la vagina.  Tiene  una erupcin.  Tiene problemas con los medicamentos. SOLICITE AYUDA DE INMEDIATO SI:   Tiene una hemorragia ms abundante que un perodo normal.  Tiene fiebre.  Siente dolor en el pecho.  Tiene dificultad para respirar.  Se siente mareada o siente como si fuera a desmayarse (vahdo.  Se desmaya.  Siente dolor en la zona superior de los hombros.  Tiene una hemorragia vaginal, con o sin cogulos de [place]. Document Released: 09/26/2010 Document Revised: 09/30/2012 [facility] Patient Information 2014 [place], [place]. Laparoscopa diagnstica (Diagnostic Laparoscopy) La laparoscopa es un procedimiento quirrgico relativamente simple, de uso habitual y breve (menos de una hora) que se lleva a cabo para diagnosticar y tratar enfermedades del abdomen. El laparoscopio (tubo delgado, que emite luz, del tamao de un lpiz y similar a un telescopio) se inserta en el abdomen a travs de una pequea incisin (corte realizado por un cirujano). A travs de este instrumento, el profesional podr observar [employer] rganos del interior del abdomen (vientre) y ver si hay algo anormal. La laparoscopa podr llevarse a cabo tanto en el hospital como en un consultorio. Podrn administrarle un sedante suave que lo ayudar a relajarse antes y durante el procedimiento. Una vez en la sala de operaciones, le administrarn una anestesia general (a menos que usted y el profesional elijan otro tipo de anestesia). Despus de la laparoscopa, que generalmente dura menos de Leone Brand, ser eBay sala de recuperacin durante algunas horas. Cuando regrese a Medical illustrator, la Hotel manager Apache Corporation y [place]. RIESGOS Y COMPLICACIONES Comparados con los beneficios, los riesgos de la laparoscopia son relativamente  pocos. El Management consultant con usted los riesgos antes del procedimiento. Algunos problemas que pueden ocurrir luego de la intervencin  son:  Infecciones.  Hemorragias.  Puede ocurrir que se lesionen otros rganos.  Efectos secundarios de Architect. PROCEDIMIENTO Una vez que se encuentra anestesiado, el cirujano insufla el abdomen con un gas inofensivo (dixido de carbono) para Museum/gallery exhibitions officer observacin de los rganos de la pelvis. El cirujano introduce el laparoscopio a travs de una pequea incisin en el ombligo o alrededor del mismo. Podr insertar otros instrumentos, como una sonda para mover los rganos o Optometrist algn procedimiento a travs de otra pequea incisin.  En ocasiones se toma una biopsia (muestra de tejido) para un diagnstico ms preciso o para Conservator, museum/gallery. La biopsia consiste en tomar una pequea muestra de tejido durante la laparoscopia para enviarlo al patlogo (especialista en la observacin de clulas y muestras de tejido) y que lo examine en el microscopio para un diagnstico a nivel de los tejidos. DESPUES DEL PROCEDIMIENTO  Se libera el gas del abdomen.  Las incisiones se cierran con puntos (suturas). Debido a que las incisiones son pequeas (generalmente de menos de 1 cm) las molestias son mnimas luego del procedimiento. Es posible que sienta cierto Loss adjuster, chartered. Es consecuencia de Programmer, systems del tubo mientras se encontraba dormido. Es posible que sienta algn dolor abdominal no muy intenso. Tambin podr sentir [employer] en las incisiones realizadas para insertar los instrumentos en el abdomen.  El tiempo de recuperacin es reducido, siempre que no haya habido complicaciones.  Har reposo en la sala de recuperacin hasta que se encuentre estable y se sienta bien. Si no aparecen complicaciones, podr regresar a su casa. AVERIGE LOS RESULTADOS DE SU ANLISIS Durante su visita no contar con todos los [employer]. En este caso, tenga otra entrevista con su mdico para conocerlos. No piense que el resultado es normal si no tiene noticias de su mdico o de  la institucin mdica. Es Building services engineer seguimiento de todos los [place]. INSTRUCCIONES [place] todos los medicamentos tal como se le indic.  Utilice los medicamentos de venta libre o de prescripcin para Conservation officer, historic buildings, Health and safety inspector o la [place], segn se lo indique el profesional que lo asiste.  Reanude las actividades habituales cuando se le indique.  Es preferible que se duche y no tome baos de inmersin.  Reanude la actividad sexual luego de [place], o cuando lo autoricen.  No conduzca mientras se encuentre bajo los efectos de narcticos. SOLICITE ATENCIN MDICA SI:  Siente un dolor abdominal inexplicable.  Siente dolor en los hombros, en la regin de los tirantes.  Si se siente aturdido o siente que se va a Insurance account manager.  Siente escalofros.  Usted o su nio tienen una temperatura oral de ms de 38,9 C (102 F).  Observa un drenaje purulento (similar al pus) que proviene de alguna de las heridas.  Usted o su hijo no puede realizar movimientos intestinales o evacuar gases.  Usted o su hijo sufren nuseas o vmitos. EST SEGURO QUE:   Comprende las instrucciones para el alta mdica.  Controlar su enfermedad.  Solicitar atencin mdica de inmediato segn las indicaciones. Document Released: 01/28/2005 Document Revised: 04/22/2011 [facility] Patient Information 2014 [place], [place].

## 2013-03-03 NOTE — OR Nursing (Signed)
Patient states that she is unaware of allergies to betadine or shrimp.  Patient has never been exposed.  Used PCMX solution for prep.

## 2013-03-03 NOTE — Anesthesia Procedure Notes (Signed)
Procedure Name: Intubation Date/Time: 03/03/2013 2:40 PM Performed by: Flossie Dibble Pre-anesthesia Checklist: Suction available, Emergency Drugs available, Timeout performed, Patient being monitored and Patient identified Patient Re-evaluated:Patient Re-evaluated prior to inductionOxygen Delivery Method: Circle system utilized Preoxygenation: Pre-oxygenation with 100% oxygen Intubation Type: IV induction Ventilation: Mask ventilation without difficulty Laryngoscope Size: Mac and 3 Grade View: Grade I Tube type: Oral Tube size: 7.0 mm Number of attempts: 1 Airway Equipment and Method: Stylet Placement Confirmation: ETT inserted through vocal cords under direct vision,  breath sounds checked- equal and bilateral and positive ETCO2 Secured at: 21 cm Tube secured with: Tape Dental Injury: Teeth and Oropharynx as per pre-operative assessment

## 2013-03-03 NOTE — Op Note (Signed)
03/03/2013  3:41 PM  PATIENT:  Monica Phelps  28 y.o. female  PRE-OPERATIVE DIAGNOSIS:  Chronic pelvic pain and dysfunctional uterine bleeding  POST-OPERATIVE DIAGNOSIS:  Same + endometriosis, stage 1  PROCEDURE:  Procedure(s): DILATATION AND CURETTAGE (N/A) LAPAROSCOPY DIAGNOSTIC (N/A) EXCISION OF ENDOMETRIOSIS  SURGEON:  Surgeon(s) and Role:    * Emily Filbert, MD - Primary   PHYSICIAN ASSISTANT:   ASSISTANTS: Mora Bellman, MD   ANESTHESIA:   general  EBL:  Total I/O In: 1000 [I.V.:1000] Out: -   BLOOD ADMINISTERED:none  DRAINS: none   LOCAL MEDICATIONS USED:  MARCAINE     SPECIMEN:  Source of Specimen:  endometriosis of left uterosacral ligament and endometriosis of the left sidewall  DISPOSITION OF SPECIMEN:  PATHOLOGY  COUNTS:  YES  TOURNIQUET:  * No tourniquets in log *  DICTATION: .Dragon Dictation  PLAN OF CARE: Discharge to home after PACU  PATIENT DISPOSITION:  PACU - hemodynamically stable.   Delay start of Pharmacological VTE agent (>24hrs) due to surgical blood loss or risk of bleeding: not applicable           The risks, benefits, and alternatives of surgery were explained, understood, and accepted. All questions were answered. Consents were signed. I explained to her that this procedure is done for diagnostic purposes as well as hopefully causing the cessation of her dysfunctional uterine bleeding. In the operating room general anesthesia was applied without complication after she was placed in the dorsal lithotomy position. Her vagina was prepped and draped in the usual sterile fashion. A latex free catheter was used to drain her bladder. A bimanual exam revealed a normal size and shape, anteverted mobile uterus. Her adnexa were nonenlarged. A speculum was placed and a single-tooth tenaculum was used to grasp the anterior lip of her nulliparous cervix. A total of 10 mL of 0.5% Marcaine was used to perform a paracervical block. Her uterus  sounded to 9 cm. Her cervix was carefully and slowly and easily dilated to accommodate a small curet. A curettage was done in all quadrants and the fundus of the uterus. A small to moderate amount of tissue was obtained. There was no bleeding noted at the end of the case.  At this point gloves were changed and attention was turned to the abdomen. I injected marcaine into the umbilicus and made an incision at this site. I placed a Veress needle and insuflated the abdomen to approximately 3 1/2 liters. Patient pressure was always less than 15. Laparoscopy confirmed corrected placement. The upper abdomen appeared normal. The pelvis was inspected in an alpha manner. All was normal with the exception of endometriosis seen high on the left uterosacral ligament and on the left pelvic sidewall near the insertion of the round ligament. I inserted a 5 mm port under direct laparoscopic visualization, taking care to avoid the inferior epigastric vessels. I used the Bovie to make a small incision of the peritoneum of both sites. I then hydrodisected both areas. I excised the peritoneum containing the endometriosis in both of these areas. Hemostasis was noted. I removed the 5 mm ports and allowed the CO2 to escape the abdomen. I closed the umbilical fascia with a 0 vicryl suture and then closed the skin with 4-0 vicryl suture.  She was taken to the recovery room after being extubated. She tolerated the procedure well.

## 2013-03-04 ENCOUNTER — Encounter (HOSPITAL_COMMUNITY): Payer: Self-pay | Admitting: Obstetrics & Gynecology

## 2013-03-16 ENCOUNTER — Encounter: Payer: Self-pay | Admitting: Obstetrics & Gynecology

## 2013-03-16 ENCOUNTER — Ambulatory Visit (INDEPENDENT_AMBULATORY_CARE_PROVIDER_SITE_OTHER): Payer: Medicaid Other | Admitting: Obstetrics & Gynecology

## 2013-03-16 VITALS — BP 110/74 | HR 79 | Ht 64.0 in | Wt 148.0 lb

## 2013-03-16 DIAGNOSIS — N719 Inflammatory disease of uterus, unspecified: Secondary | ICD-10-CM

## 2013-03-16 DIAGNOSIS — Z09 Encounter for follow-up examination after completed treatment for conditions other than malignant neoplasm: Secondary | ICD-10-CM

## 2013-03-16 DIAGNOSIS — N809 Endometriosis, unspecified: Secondary | ICD-10-CM

## 2013-03-16 MED ORDER — DOXYCYCLINE HYCLATE 100 MG PO CAPS: 100.0000 mg | ORAL_CAPSULE | Freq: Two times a day (BID) | ORAL | Status: DC

## 2013-03-16 NOTE — Progress Notes (Signed)
   Subjective:    Patient ID: Monica Phelps, female    DOB: May 17, 1985, 28 y.o.   MRN: [id number]  HPI  She is here for a post op visit after a d&c and h/s with removal of endometriosis. Her pain is definitely improved. She declines to take OCPs because they cause her depression and "personality changes". She has no post op problems.  Review of Systems     Objective:   Physical Exam  Well-healed incisions.      Assessment & Plan:  Post op doing well Endometriosis- She will come back in 2 weeks for Mirena. She tells me that she and her husband do not ever want children. She declines a flu vaccine Endometritis on d&c pathology- doxy for a week

## 2013-04-02 ENCOUNTER — Ambulatory Visit (INDEPENDENT_AMBULATORY_CARE_PROVIDER_SITE_OTHER): Payer: Medicaid Other | Admitting: Obstetrics & Gynecology

## 2013-04-02 ENCOUNTER — Other Ambulatory Visit (HOSPITAL_COMMUNITY): Payer: Self-pay | Admitting: Vascular Surgery

## 2013-04-02 ENCOUNTER — Other Ambulatory Visit (HOSPITAL_COMMUNITY): Payer: Self-pay | Admitting: Obstetrics & Gynecology

## 2013-04-02 ENCOUNTER — Encounter: Payer: Self-pay | Admitting: Obstetrics & Gynecology

## 2013-04-02 ENCOUNTER — Ambulatory Visit (HOSPITAL_COMMUNITY)
Admission: RE | Admit: 2013-04-02 | Discharge: 2013-04-02 | Disposition: A | Discharge status: Home / Self Care | Payer: Medicaid Other | Source: Ambulatory Visit | Attending: Obstetrics & Gynecology | Admitting: Obstetrics & Gynecology

## 2013-04-02 VITALS — BP 119/88 | HR 78 | Ht 63.0 in | Wt 147.0 lb

## 2013-04-02 DIAGNOSIS — N938 Other specified abnormal uterine and vaginal bleeding: Secondary | ICD-10-CM

## 2013-04-02 DIAGNOSIS — M79609 Pain in unspecified limb: Secondary | ICD-10-CM | POA: Insufficient documentation

## 2013-04-02 DIAGNOSIS — M25569 Pain in unspecified knee: Secondary | ICD-10-CM

## 2013-04-02 DIAGNOSIS — M79605 Pain in left leg: Secondary | ICD-10-CM

## 2013-04-02 DIAGNOSIS — N949 Unspecified condition associated with female genital organs and menstrual cycle: Secondary | ICD-10-CM

## 2013-04-02 MED ORDER — NORGESTREL-ETHINYL ESTRADIOL 0.5-50 MG-MCG PO TABS: ORAL_TABLET | ORAL | Status: DC

## 2013-04-02 NOTE — Progress Notes (Signed)
*  PRELIMINARY RESULTS* Vascular Ultrasound Left lower extremity venous duplex has been completed.  Preliminary findings: no evidence of DVT or SVT.  Called report to [facility].   Landry Mellow, RDMS, RVT  04/02/2013, 12:00 PM

## 2013-04-02 NOTE — Addendum Note (Signed)
Addended by: Erik Obey on: 04/02/2013 10:17 AM   Modules accepted: Orders

## 2013-04-02 NOTE — Progress Notes (Signed)
   Subjective:    Patient ID: Monica Phelps, female    DOB: 11/20/1985, 28 y.o.   MRN: [id number]  HPI  28 yo MH lady with biopsy proven endometriosis who is here with irregular bleeding. She stopped taking OCPs due to mood changes and got a depo provera shot 03/03/12. She complains of irregular bleeding and the return of her pelvic pain. She also complains of a "knot" on the outside of her lower leg about 4 inches above her ankle. She also complains of radiation of the pain up into her leg.  Review of Systems     Objective:   Physical Exam Negative Homan's test 3 cm tender firmness in position described above       Assessment & Plan:  Tender swelling in left lower leg- I will order a Doppler study If Doppler is negative, then she can start ovral (generic) in a continuous fashion. Once bleeding is stopped, then I will change her to a lower dose OCP.

## 2013-04-09 ENCOUNTER — Inpatient Hospital Stay (HOSPITAL_COMMUNITY)
Admission: AD | Admit: 2013-04-09 | Discharge: 2013-04-09 | Disposition: A | Discharge status: Home / Self Care | Payer: Medicaid Other | Source: Ambulatory Visit | Attending: Obstetrics & Gynecology | Admitting: Obstetrics & Gynecology

## 2013-04-09 ENCOUNTER — Encounter (HOSPITAL_COMMUNITY): Payer: Self-pay

## 2013-04-09 DIAGNOSIS — R300 Dysuria: Secondary | ICD-10-CM | POA: Insufficient documentation

## 2013-04-09 DIAGNOSIS — K219 Gastro-esophageal reflux disease without esophagitis: Secondary | ICD-10-CM | POA: Insufficient documentation

## 2013-04-09 DIAGNOSIS — M545 Low back pain, unspecified: Secondary | ICD-10-CM | POA: Insufficient documentation

## 2013-04-09 DIAGNOSIS — N809 Endometriosis, unspecified: Secondary | ICD-10-CM

## 2013-04-09 DIAGNOSIS — M549 Dorsalgia, unspecified: Secondary | ICD-10-CM

## 2013-04-09 HISTORY — DX: Endometriosis, unspecified: N80.9

## 2013-04-09 LAB — URINALYSIS, ROUTINE W REFLEX MICROSCOPIC
BILIRUBIN URINE: NEGATIVE
Glucose, UA: NEGATIVE mg/dL
Ketones, ur: NEGATIVE mg/dL
Nitrite: NEGATIVE
PH: 6.5 (ref 5.0–8.0)
Protein, ur: NEGATIVE mg/dL
Specific Gravity, Urine: <1.005 — ABNORMAL LOW (ref 1.005–1.030)
Urobilinogen, UA: 0.2 mg/dL (ref 0.0–1.0)

## 2013-04-09 LAB — URINE MICROSCOPIC-ADD ON

## 2013-04-09 LAB — WET PREP, GENITAL
CLUE CELLS WET PREP: NONE SEEN
TRICH WET PREP: NONE SEEN
Yeast Wet Prep HPF POC: NONE SEEN

## 2013-04-09 LAB — POCT PREGNANCY, URINE: Preg Test, Ur: NEGATIVE

## 2013-04-09 NOTE — MAU Provider Note (Signed)
History     CSN: [id number]  Arrival date and time: 04/09/13 1901   First Provider Initiated Contact with Patient 04/09/13 2011      Chief Complaint  Patient presents with  . Back Pain   HPI Monica Phelps is 28 y.o. N5A2 presenting with lower back pain that began after the surgery and has persisted.   She has burning with urination after intercourse.  She frequency at night time, getting her up during sleep 5-10 times, doesn't feel like she completely empties.    Hx of chronic pelvic pain with Dx Lap and lysis of adhesions and D&C on 03/03/13.  She had second trimester miscarriage in 11/14.  Hx Endometriosis. Dr. Hulan Fray was her surgeon and was seen by her this week. Continues with bleeding since surgery--1 pad per day.  She was given an injection int he clinic for the bleeding and Dr. Hulan Fray plans to re-evaluate next week.   Past Medical History  Diagnosis Date  . Reflux   . UTI (lower urinary tract infection)   . Constipation   . Gastritis   . Fibroid   . History of bleeding disorder as a child   . Diabetes mellitus without complication   . Asthma   . Headache(784.0)     migraines  . GERD (gastroesophageal reflux disease)   . Anxiety     paniac  . Blood dyscrasia     pt states hemorrhaged with last period 12/14 and was hospitalized in [place]  . Endometriosis     Past Surgical History  Procedure Laterality Date  . No past surgeries    . Dilation and curettage of uterus    . Induced abortion      at 5 months fetal anomalies hemorrhaged afterwards  . Dilation and curettage of uterus N/A 03/03/2013    Procedure: DILATATION AND CURETTAGE;  Surgeon: Emily Filbert, MD;  Location: [facility];  Service: Gynecology;  Laterality: N/A;  . Laparoscopy N/A 03/03/2013    Procedure: LAPAROSCOPY DIAGNOSTIC;  Surgeon: Emily Filbert, MD;  Location: [facility];  Service: Gynecology;  Laterality: N/A;  . Laparoscopic lysis of adhesions N/A 03/03/2013    Procedure: LAPAROSCOPIC LYSIS OF ADHESIONS;   Surgeon: Emily Filbert, MD;  Location: [facility];  Service: Gynecology;  Laterality: N/A;    Family History  Problem Relation Age of Onset  . Alcohol abuse Neg Hx   . Arthritis Neg Hx   . Asthma Neg Hx   . Birth defects Neg Hx   . Cancer Neg Hx   . Depression Neg Hx   . COPD Neg Hx   . Diabetes Neg Hx   . Drug abuse Neg Hx   . Early death Neg Hx   . Hearing loss Neg Hx   . Heart disease Neg Hx   . Kidney disease Neg Hx   . Learning disabilities Neg Hx   . Mental illness Neg Hx   . Mental retardation Neg Hx   . Miscarriages / Stillbirths Neg Hx   . Stroke Neg Hx   . Vision loss Neg Hx   . Hypertension Mother   . Hyperlipidemia Father     History  Substance Use Topics  . Smoking status: Never Smoker   . Smokeless tobacco: Never Used  . Alcohol Use: No    Allergies:  Allergies  Allergen Reactions  . Dilaudid [Hydromorphone Hcl] Anaphylaxis and Nausea And Vomiting  . Morphine And Related Shortness Of Breath and Nausea And Vomiting  .  Latex Itching    Prescriptions prior to admission  Medication Sig Dispense Refill  . Ascorbic Acid (VITAMIN C) 1000 MG tablet Take 1,000 mg by mouth daily.        Review of Systems  Constitutional: Negative for fever and chills.  Gastrointestinal: Positive for abdominal pain.  Genitourinary: Positive for urgency and frequency.       + vaginal bleeding   Physical Exam   Blood pressure 126/70, pulse 74, temperature 98.5 F (36.9 C), resp. rate 20, height 5\' 4"  (1.626 m), weight 151 lb (68.493 kg), SpO2 100.00%.  Physical Exam  Constitutional: She is oriented to person, place, and time. She appears well-developed and well-nourished.  HENT:  Head: Normocephalic.  Neurological: She is alert and oriented to person, place, and time.  Psychiatric: She has a normal mood and affect. Her behavior is normal.   PELVIC exam by Dr. Hulan Fray  Results for orders placed during the hospital encounter of 04/09/13 (from the past 24 hour(s))   URINALYSIS, ROUTINE W REFLEX MICROSCOPIC     Status: Abnormal   Collection Time    04/09/13  7:35 PM      Result Value Ref Range   Color, Urine YELLOW  YELLOW   APPearance CLEAR  CLEAR   Specific Gravity, Urine <1.005 (*) 1.005 - 1.030   pH 6.5  5.0 - 8.0   Glucose, UA NEGATIVE  NEGATIVE mg/dL   Hgb urine dipstick LARGE (*) NEGATIVE   Bilirubin Urine NEGATIVE  NEGATIVE   Ketones, ur NEGATIVE  NEGATIVE mg/dL   Protein, ur NEGATIVE  NEGATIVE mg/dL   Urobilinogen, UA 0.2  0.0 - 1.0 mg/dL   Nitrite NEGATIVE  NEGATIVE   Leukocytes, UA SMALL (*) NEGATIVE  URINE MICROSCOPIC-ADD ON     Status: None   Collection Time    04/09/13  7:35 PM      Result Value Ref Range   Squamous Epithelial / LPF RARE  RARE   WBC, UA 0-2  <3 WBC/hpf   RBC / HPF 3-6  <3 RBC/hpf   Bacteria, UA RARE  RARE  POCT PREGNANCY, URINE     Status: None   Collection Time    04/09/13  7:43 PM      Result Value Ref Range   Preg Test, Ur NEGATIVE  NEGATIVE   MAU Course  Procedures  MDM Reported MSE to Dr[facility] Hul[facility] at [facility]  She will come in the see the patient. Dr. Hulan Fray in to see patient at 20:52  Pelvic exam done by Dr. Hulan Fray.  She reports very little bleeding noted and she was Neg for CVA tenderness.   Wet prep obtained and patient told results would be reviewed at her follow up visit.  Order given to discharge patient to home.  Urine culture and wet prep pending.  Instructed to take Motrin at home.  Follow up with Dr. Hulan Fray in the clinic.    Assessment and Plan  A:  Back pain      Urine culture pending  P:  Call clinic on Monday to schedule follow up appointment.        Return if sxs worsens     KEY,EVE M 04/09/2013, 8:19 PM

## 2013-04-09 NOTE — MAU Note (Addendum)
Having flank pain for 3 wks. Had SAB in November and had bleeding since then. Had D&C 3 wks ago along with laparoscopy. Was treated in Jan for kidney infection. Now having flank pain and thinks may be kidney infection. When i take a deep breath it hurts in my back

## 2013-04-09 NOTE — Discharge Instructions (Signed)
Back Pain, Adult Low back pain is very common. About 1 in 5 people have back pain.The cause of low back pain is rarely dangerous. The pain often gets better over time.About half of people with a sudden onset of back pain feel better in just 2 weeks. About 8 in 10 people feel better by 6 weeks.  CAUSES Some common causes of back pain include:  Strain of the muscles or ligaments supporting the spine.  Wear and tear (degeneration) of the spinal discs.  Arthritis.  Direct injury to the back. DIAGNOSIS Most of the time, the direct cause of low back pain is not known.However, back pain can be treated effectively even when the exact cause of the pain is unknown.Answering your caregiver's questions about your overall health and symptoms is one of the most accurate ways to make sure the cause of your pain is not dangerous. If your caregiver needs more information, he or she may order lab work or imaging tests (X-rays or MRIs).However, even if imaging tests show changes in your back, this usually does not require surgery. HOME CARE INSTRUCTIONS For many people, back pain returns.Since low back pain is rarely dangerous, it is often a condition that people can learn to manageon their own.   Remain active. It is stressful on the back to sit or stand in one place. Do not sit, drive, or stand in one place for more than 30 minutes at a time. Take short walks on level surfaces as soon as pain allows.Try to increase the length of time you walk each day.  Do not stay in bed.Resting more than 1 or 2 days can delay your recovery.  Do not avoid exercise or work.Your body is made to move.It is not dangerous to be active, even though your back may hurt.Your back will likely heal faster if you return to being active before your pain is gone.  Pay attention to your body when you bend and lift. Many people have less discomfortwhen lifting if they bend their knees, keep the load close to their bodies,and  avoid twisting. Often, the most comfortable positions are those that put less stress on your recovering back.  Find a comfortable position to sleep. Use a firm mattress and lie on your side with your knees slightly bent. If you lie on your back, put a pillow under your knees.  Only take over-the-counter or prescription medicines as directed by your caregiver. Over-the-counter medicines to reduce pain and inflammation are often the most helpful.Your caregiver may prescribe muscle relaxant drugs.These medicines help dull your pain so you can more quickly return to your normal activities and healthy exercise.  Put ice on the injured area.  Put ice in a plastic bag.  Place a towel between your skin and the bag.  Leave the ice on for 15-20 minutes, 03-04 times a day for the first 2 to 3 days. After that, ice and heat may be alternated to reduce pain and spasms.  Ask your caregiver about trying back exercises and gentle massage. This may be of some benefit.  Avoid feeling anxious or stressed.Stress increases muscle tension and can worsen back pain.It is important to recognize when you are anxious or stressed and learn ways to manage it.Exercise is a great option. SEEK MEDICAL CARE IF:  You have pain that is not relieved with rest or medicine.  You have pain that does not improve in 1 week.  You have new symptoms.  You are generally not feeling well. SEEK   IMMEDIATE MEDICAL CARE IF:   You have pain that radiates from your back into your legs.  You develop new bowel or bladder control problems.  You have unusual weakness or numbness in your arms or legs.  You develop nausea or vomiting.  You develop abdominal pain.  You feel faint. Document Released: 01/28/2005 Document Revised: 07/30/2011 Document Reviewed: 06/18/2010 [employer] Patient Information 2014 [employer].  

## 2013-04-11 LAB — URINE CULTURE: Colony Count: 3000

## 2013-04-19 ENCOUNTER — Encounter: Payer: Self-pay | Admitting: Family Medicine

## 2013-04-19 ENCOUNTER — Ambulatory Visit (INDEPENDENT_AMBULATORY_CARE_PROVIDER_SITE_OTHER): Payer: Medicaid Other | Admitting: Family Medicine

## 2013-04-19 VITALS — BP 101/78 | HR 72 | Ht 64.0 in | Wt 148.0 lb

## 2013-04-19 DIAGNOSIS — N86 Erosion and ectropion of cervix uteri: Secondary | ICD-10-CM

## 2013-04-19 DIAGNOSIS — N6011 Diffuse cystic mastopathy of right breast: Secondary | ICD-10-CM

## 2013-04-19 DIAGNOSIS — IMO0002 Reserved for concepts with insufficient information to code with codable children: Secondary | ICD-10-CM

## 2013-04-19 DIAGNOSIS — Z124 Encounter for screening for malignant neoplasm of cervix: Secondary | ICD-10-CM

## 2013-04-19 DIAGNOSIS — N926 Irregular menstruation, unspecified: Secondary | ICD-10-CM

## 2013-04-19 DIAGNOSIS — N803 Endometriosis of pelvic peritoneum, unspecified: Secondary | ICD-10-CM

## 2013-04-19 DIAGNOSIS — N6019 Diffuse cystic mastopathy of unspecified breast: Secondary | ICD-10-CM

## 2013-04-19 DIAGNOSIS — N6012 Diffuse cystic mastopathy of left breast: Secondary | ICD-10-CM

## 2013-04-19 NOTE — Patient Instructions (Signed)
Cambios fibroqusticos en las mamas (Fibrocystic Breast Changes) Los cambios fibroqusticos en las mamas ocurren cuando los conductos mamarios se obstruyen y se producen bultos llenos de lquidoquistes) en las [place]. Esta es una enfermedad frecuente que no es cancerosa (es benigna). Aparece cuando una mujer atraviesa cambios hormonales durante su perodo menstrual. Los cambios fibroqusticos en las mamas pueden afectar una o [place]. CAUSAS  La causa exacta de los cambios fibroqusticos en las mamas no se conocen, pero pueden relacionarse con las hormonas femeninas, estrgeno y Immunologist. Los rasgos familiares que se transmiten de padres a hijos (genticos) pueden ser un factor en algunos casos. SIGNOS Y SNTOMAS   Sensibilidad, molestias leves o dolor.  Hinchazn.  Sensacin de tocar cordones en la mama.  Mama con bultos, de uno o ambos lados.  Cambios en el tamao de la mama, especialmente antes (ms grandes) y despus (ms pequeos) del perodo menstrual.  Secrecin de color verde o marrn oscura por el pezn (que no es [place]). Los sntomas generalmente empeoran antes del inicio de los perodos menstruales y mejoran hacia el final de los mismos.  DIAGNSTICO  Para diagnosticarlo, el mdico le har preguntas y Optometrist un examen fsico de las mamas. El mdico podr indicar otros estudios que examinarn el interior de las [place], como:  Un estudio [place] [place]).  Ecografas.  Un estudio de imagen por Health visitor. Si se sospecha algo ms que cambios fibroqusticos en las [place], el mdico podr tomar [employer] de tejido [place]. (biopsia mamaria) para ser examinado. TRATAMIENTO  En general no se requiere tratamiento. El mdico podr Product/process development scientist de venta libre para Therapist, occupational o las molestias causadas por los cambios fibroqusticos en las [place]. Tambin podrn indicarle que cambie la dieta o que limite o deje de consumir algunos  alimentos o beber lquidos que contengan cafena. Los alimentos y las bebidas que contienen cafena [employer], las gaseosas, el caf y el t. Tambin puede ayudar si reduce el consumo de azcar y grasas. El mdico tambin podr indicar:  Aspiracin con aguja fina para retirar el lquido de un quiste que le cause dolor.  Ciruga para extirpar un quiste grande, persistente y sensible. INSTRUCCIONES PARA EL CUIDADO EN EL HOGAR   Examine sus mamas despus de cada perodo menstrual. Controle sus mamas el primer da de cada mes, si no tiene el perodo menstrual. [place] para detectar cambios, como ms sensibilidad, un bulto que no estaba, cambios en el tamao de las mamas o cambios en un bulto que siempre [place].  Tome slo medicamentos de venta libre o recetados, segn las indicaciones del mdico.  Use un sostn de soporte o un sostn deportivo que le ajuste bien, especialmente al hacer ejercicios.  Disminuya o evite la cafena, las grasas y Glass blower/designer en su dieta, segn las indicaciones de su mdico. SOLICITE ATENCIN MDICA SI:   Margette Fast prdida de lquido (secrecin) por los pezones, especialmente si es un lquido sanguinolento.  Tiene nuevos bultos o ndulos en la mama.  Una o ambas mamas se agrandan, estn irritadas o le duelen.  Tiene zonas en las mamas que se hunden.  Los pezones estn planos o sangrantes. Document Released: 05/16/2008 Document Revised: 09/30/2012 [facility] Patient Information 2014 [place], [place]. Endometriosis (Endometriosis) La endometriosis es una enfermedad en la que el tejido que rodea al tero (endometrio) crece fuera de su ubicacin normal. El tejido puede crecer en muchos lugares cerca del tero, pero comnmente crece en los ovarios, las  trompas de Falopio, la vagina o el intestino. Dado que el tero expulsa o desprende su revestimiento en cada ciclo menstrual, hay sangrado en el lugar donde se localiza el tejido endometrial. Esto puede causar dolor  porque la sangre es irritante para los tejidos que no estn normalmente expuestos a Librarian, academic.  CAUSAS  Se desconoce la causa de la endometriosis.  SIGNOS Y SNTOMAS  A menudo, no hay sntomas. Cuando se presentan sntomas, estos pueden variar segn la ubicacin del tejido desplazado. Pueden ocurrir diversos sntomas en diferentes momentos. Aunque los sntomas se producen principalmente durante el perodo menstrual de [place], tambin pueden aparecer en la mitad del ciclo y generalmente terminan con la menopausia. Algunas personas pueden pasar meses sin experimentar ningn tipo de sntomas. Los sntomas pueden ser:   Dolor abdominal o en la espalda.  Sangrado ms abundante durante los perodos [place].  [place].  Dolor al defecar.  Infertilidad. DIAGNSTICO  El mdico le preguntar acerca de sus sntomas y le har un examen fsico. Se pueden realizar varios estudios, por ejemplo:   Anlisis de [place] y [place]. Estos se realizan para ayudar a Sport and exercise psychologist.  Ecografas. Este estudio se realiza para observar el tejido anormal.  Ecografa de la parte inferior del intestino (enema de bario).  Laparoscopia. En este procedimiento, se inserta un tubo delgado, que emite luz y tiene una pequea cmara en el extremo (laparoscopio) dentro del abdomen. Esto ayuda a que su mdico observe el tejido anormal para confirmar el diagnstico. El mdico tambin puede quitar una pequea parte de tejido anormal (biopsia) que encuentra. Posteriormente esta muestra de tejido se enva a un laboratorio para examinarlo con un microscopio. [place] y puede incluir lo siguiente:   Medicamentos para Best boy. Los antiinflamatorios no esteroides Dayna Ramus) son un tipo de analgsico que pueden ayudar a Best boy causado por la endometriosis.  Terapia hormonal. Cuando se use la terapia hormonal, se eliminan los perodos menstruales. Esto  elimina la exposicin mensual a la sangre del tejido endometrial desplazado.  Ciruga. Algunas veces puede hacerse una ciruga para extirpar el tejido endometrial anormal. En casos graves, la ciruga puede hacerse para extirpar las trompas de [place], el tero y los ovarios (histerectoma). INSTRUCCIONES PARA EL CUIDADO EN EL HOGAR   Utilice los medicamentos de venta libre o recetados para Glass blower/designer, el malestar o la [place], segn se lo indique el mdico. No tome aspirina porque puede aumentar el sangrado cuando no recibe terapia hormonal.  Evite actividades que produzcan dolor, incluida la actividad sexual. SOLICITE ATENCIN MDICA SI:  Tiene dolor plvico durante los perodos menstruales y antes y despus de [place].  Siente dolor plvico [employer] perodos menstruales que empeora durante el perodo.  Experimenta dolor plvico durante la actividad sexual o despus de [place].  Siente dolor plvico al defecar u orinar, especialmente durante el perodo menstrual.  Tiene dificultad para quedar embarazada. SOLICITE ATENCIN MDICA DE INMEDIATO SI:   El dolor es intenso y no responde a los analgsicos.  Siente nuseas y vmitos intensos, o no puede [employer].  Tiene dolor que se limita a la parte inferior derecha del abdomen.  Presenta hinchazn o aumento del dolor en el abdomen.  Observa sangre en la materia fecal.  Tiene fiebre o sntomas persistentes durante ms de 2a 3das.  Tiene fiebre y los sntomas empeoran repentinamente. ASEGRESE DE QUE:   Comprende estas instrucciones.  Controlar su afeccin.  Recibir [employer]  de inmediato si no mejora o si empeora. Document Released: 01/28/2005 Document Revised: 11/18/2012 [facility] Patient Information 2014 [place], [place]. Hemorragia uterina disfuncional (Abnormal Uterine Bleeding) La hemorragia uterina disfuncional puede tener muchas causas. En algunos casos se mejora simplemente con un tratamiento. En otros casos  puede ser ms grave. Hay varios tipos de hemorragia que se consideran disfuncionales. Ellas son:  Minda Meo perodos.  [place].  Prdida de sangre en cualquier momento del ciclo menstrual.  Hemorragia abundante o ms que lo habitual.  Sangrado luego de la menopausia. CAUSAS Hay numerosas causas que originan este problema. Puede ocurrir en adolescentes, mujeres embarazadas, en aquellas que se encuentran en su vida reproductiva y en las que han llegado a la menopausia. El mdico buscar las causas ms comunes, de acuerdo a su edad, los signos y los sntomas que presenta y su Medical laboratory scientific officer. En la [employer] no reviste gravedad y se trata sin dificultad. An en los casos ms graves, como el cncer en los rganos femeninos. puede tratarse adecuadamente si se detecta en etapas tempranas. Es por eso que todos los tipos de hemorragia deben evaluarse y tratarse lo antes posible. DIAGNSTICO El diagnstico de la causa puede requerir varios tipos de pruebas. El profesional podr:  Optometrist una historia completa del tipo de hemorragia.  Realizar un examen fsico completo y un papanicolau.  Indicar una ecografa de abdomen que muestre una imagen de los rganos femeninos y de la pelvis.  Podr inyectar una sustancia de contraste en el tero y las trompas de Falopio y tomar radiografas (histerosalpingografa).  Inyectar lquido en el tero para realizar una ecografa (sonohisterografa)  Indicar una tomografa computada para examinar los rganos femeninos y la pelvis.  Indicar una resonancia magntica para examinar los rganos femeninos y la pelvis. Este procedimiento no implica la aplicacin de rayos X.  Observar el interior del tero con un dispositivo similar a un telescopo que tiene una luz en un extremo (histeroscopio).  Realizar un raspado del tero para obtener muestras de tejido y examinarlas (dilatacin y curetaje).  Observar  el interior de la pelvis con un dispositivo similar a un telescopio que tiene una luz en un extremo (laparoscopio). Esto se lleva a cabo a travs de un corte muy pequeo (incisin) en el abdomen. TRATAMIENTO El tratamiento depender de la causa. Podr incluir:  No tomar medidas y dejar que el problema se solucione por s mismo.  Tratamiento hormonal.  Pldoras anticonceptivas.  Tratamiento del problema mdico que causa la hemorragia.  Laparoscopa:  Ciruga mayor o menor.  Destruccin del tapizado interno de la pared del tero con corriente elctrica, rayo lser, congelamiento o calor (ablacin uterina). [place] recomendaciones de su mdico acerca de cmo tratar el problema.  Consulte con su mdico si no tiene su perodo menstrual y piensa que puede estar [place].  Si presenta una hemorragia abundante, cuente el nmero de apsitos o tampones que [place] y con qu frecuencia debe cambiarlos. Convrselo con el profesional que la asiste.  Evite las relaciones sexuales hasta que el problema sea controlado. SOLICITE ATENCIN MDICA SI:  Sufre algn tipo de hemorragia disfuncional como las ya mencionadas.  Se siente mareada.  Tiene ms de 73 aos y an no ha tenido su perodo menstrual. SOLICITE ATENCIN MDICA DE INMEDIATO SI:  Se desmaya.  Debe cambiarse el apsito o el tampn cada 15 a 30 minutos.  Siente dolor abdominal.  La temperatura se eleva por encima  de 100 F (37.8 C).  Philbert Riser o se siente dbil.  Elimina cogulos grandes por la vagina.  Si tiene Higher education careers adviser (nuseas) y vmitos. Document Released: 01/28/2005 Document Revised: 04/22/2011 [facility] Patient Information 2014 [place], [place].

## 2013-04-20 ENCOUNTER — Other Ambulatory Visit (HOSPITAL_COMMUNITY)
Admission: RE | Admit: 2013-04-20 | Discharge: 2013-04-20 | Disposition: A | Discharge status: Home / Self Care | Payer: Medicaid Other | Source: Ambulatory Visit | Attending: Family Medicine | Admitting: Family Medicine

## 2013-04-20 DIAGNOSIS — Z01419 Encounter for gynecological examination (general) (routine) without abnormal findings: Secondary | ICD-10-CM | POA: Insufficient documentation

## 2013-04-20 DIAGNOSIS — N6012 Diffuse cystic mastopathy of left breast: Secondary | ICD-10-CM

## 2013-04-20 DIAGNOSIS — N6011 Diffuse cystic mastopathy of right breast: Secondary | ICD-10-CM | POA: Insufficient documentation

## 2013-04-20 NOTE — Progress Notes (Signed)
    Subjective:    Patient ID: Monica Phelps is a 28 y.o. female presenting with Follow-up  on 04/19/2013  HPI: This is a complicated pt. With h/o abnormal bleeding after beginning intercourse.  Was going to have cryoablation of ectropion of cervix when found to be pregnant.  Continued to bleed throughout pregnancy and had a mid-trimester miscarriage.  Post delivery continued to have bleeding and received Depo and U/s which was normal. She underwent D and C and laparoscopy.  Found to have endometriosis and chronic endometritis.  Received 7 day course of doxycycline which she completed. Continues to report painful intercourse.  She is frequently on top, but it is still quite painful.  Has been given OCP's and did not like how they made her feel, and they did not stop her bleeding.  After her last visit, she did not start OC's. She is also complaining of lumps in breasts, which are bilateral and multiple.    Review of Systems  Constitutional: Negative for fever and chills.  Respiratory: Negative for shortness of breath.   Cardiovascular: Negative for chest pain.  Gastrointestinal: Negative for nausea, vomiting and abdominal pain.  Genitourinary: Positive for menstrual problem and pelvic pain. Negative for dysuria.  Skin: Negative for rash.      Objective:    BP 101/78  Pulse 72  Ht 5\' 4"  (1.626 m)  Wt 148 lb (67.132 kg)  BMI 25.39 kg/m2 Physical Exam  Constitutional: She is oriented to person, place, and time. She appears well-developed and well-nourished. No distress.  HENT:  Head: Normocephalic and atraumatic.  Eyes: No scleral icterus.  Neck: Neck supple.  Cardiovascular: Normal rate.   Pulmonary/Chest: Effort normal.  Bilateral fibrocystic areas noted.  Abdominal: Soft.  Genitourinary: Uterus is tender. Uterus is not enlarged. Right adnexum displays no mass and no tenderness. Left adnexum displays no mass and no tenderness. There is tenderness around the vagina.    Neurological: She is alert and oriented to person, place, and time.  Skin: Skin is warm and dry.  Psychiatric: She has a normal mood and affect.        Assessment & Plan:  Ectropion of cervix Unclear if this is the primary cause of her bleeding, but will attempt to freeze in an effort to eliminate this as potential cause.  Endometriosis of pelvis Pelvic pain is likely associated with this.  She is s/p Depo which should be helping.  May also consider addition of IUD, Aygestin or OC's.  Pt. Is reluctant to try these at present.  Will give her some time to see if this improves.  She will return when and if she makes a decision regarding further treatment options.  Discussed pelvic clean out, but do not think we should do this until she has had some time to consider options.  Irregular uterine bleeding Possibly related to Depo--will allow her body some time to get back into normal cycles for her after Depo.  Fibrocystic breast changes of both breasts Reassurance given.   Anxiety appears to be playing a role in her symptoms.  Also, she just had a very traumatic birth experience and loss.  I do not feel it is appropriate to make permanent decisions until she is at least a year out from this.  Return in about 4 weeks (around 05/17/2013).

## 2013-04-20 NOTE — Assessment & Plan Note (Signed)
Reassurance given.  

## 2013-04-20 NOTE — Addendum Note (Signed)
Addended by: Donnamae Jude on: 04/20/2013 01:03 PM   Modules accepted: Orders

## 2013-04-20 NOTE — Assessment & Plan Note (Signed)
Pelvic pain is likely associated with this.  She is s/p Depo which should be helping.  May also consider addition of IUD, Aygestin or OC's.  Pt. Is reluctant to try these at present.  Will give her some time to see if this improves.  She will return when and if she makes a decision regarding further treatment options.  Discussed pelvic clean out, but do not think we should do this until she has had some time to consider options.

## 2013-04-20 NOTE — Assessment & Plan Note (Signed)
Possibly related to Depo--will allow her body some time to get back into normal cycles for her after Depo.

## 2013-04-20 NOTE — Assessment & Plan Note (Signed)
Unclear if this is the primary cause of her bleeding, but will attempt to freeze in an effort to eliminate this as potential cause.

## 2013-06-01 ENCOUNTER — Ambulatory Visit (INDEPENDENT_AMBULATORY_CARE_PROVIDER_SITE_OTHER): Payer: Medicaid Other | Admitting: Nurse Practitioner

## 2013-06-01 ENCOUNTER — Encounter: Payer: Self-pay | Admitting: Nurse Practitioner

## 2013-06-01 VITALS — BP 123/87 | HR 77 | Ht 64.0 in | Wt 152.0 lb

## 2013-06-01 DIAGNOSIS — N949 Unspecified condition associated with female genital organs and menstrual cycle: Secondary | ICD-10-CM

## 2013-06-01 DIAGNOSIS — R102 Pelvic and perineal pain: Principal | ICD-10-CM

## 2013-06-01 DIAGNOSIS — N803 Endometriosis of pelvic peritoneum, unspecified: Secondary | ICD-10-CM

## 2013-06-01 DIAGNOSIS — N926 Irregular menstruation, unspecified: Secondary | ICD-10-CM

## 2013-06-01 DIAGNOSIS — IMO0002 Reserved for concepts with insufficient information to code with codable children: Secondary | ICD-10-CM

## 2013-06-01 DIAGNOSIS — G8929 Other chronic pain: Secondary | ICD-10-CM | POA: Insufficient documentation

## 2013-06-01 DIAGNOSIS — N86 Erosion and ectropion of cervix uteri: Secondary | ICD-10-CM

## 2013-06-01 MED ORDER — NORETHINDRONE ACET-ETHINYL EST 1-20 MG-MCG PO TABS: 1.0000 | ORAL_TABLET | Freq: Every day | ORAL | Status: DC

## 2013-06-01 NOTE — Progress Notes (Signed)
History:  Monica Mccomber LopezAliceais a 28 y.o. [id number] who presents to [facility]  clinic today for follow up on DUB. She was last seen by Dr Kennon Rounds  in March and she was given the choice of continuing the Depo Provera, Manuella Ghazi IUD or birth control pills. Yesterday she went to [facility] with same bleeding complaints and had another full work up including another ultrasound. She was told they had no answer to the question of why she was continuing to bleed. Today she is given same options Dr Kennon Rounds offered her and she has chosen BCPs.   The following portions of the patient's history were reviewed and updated as appropriate: allergies, current medications, past family history, past medical history, past social history, past surgical history and problem list.  Review of Systems:  Pertinent items are noted in HPI.  Objective:  Physical Exam BP 123/87  Pulse 77  Ht 5\' 4"  (1.626 m)  Wt 152 lb (68.947 kg)  BMI 26.08 kg/m2 GENERAL: Well-developed, well-nourished female in no acute distress.  HEENT: Normocephalic, atraumatic.  NECK: Supple. Normal thyroid.  LUNGS: Normal rate. Clear to auscultation bilaterally.  HEART: Regular rate and rhythm with no adventitious sounds.  EXTREMITIES: No cyanosis, clubbing, or edema, 2+ distal pulses.   Labs and Imaging No results found.  Assessment & Plan:  Assessment:  DUB  Plans:  Loestrin BCP one po qday # 28/11 refills Advised to RTC in 3 months for follow up  Monica Messier, NP 06/01/2013 3:37 PM

## 2013-06-01 NOTE — Patient Instructions (Signed)
Eleccin del mtodo anticonceptivo (Contraception Choices) La anticoncepcin (control de la natalidad) es el uso de cualquier mtodo o dispositivo para Therapist, occupational. A continuacin se indican algunos de esos mtodos. ANTICONCEPTIVOS HORMONALES  Un pequeo tubo colocado bajo la piel de la parte superior del brazo (implante). El tubo puede Nutritional therapist durante 3 aos. El implante debe quitarse despus de 3 aos.  Inyecciones que se aplican cada 3 meses.  Pldoras que deben [employer].  Parches que se cambian una vez por semana.  Un anillo que se coloca en la vagina (anillo vaginal). El anillo se deja en su lugar durante 3 semanas y se retira durante 1 semana. Luego se coloca un nuevo anillo en la vagina.  Pldoras para el control de la natalidad despus de Best boy sexo (relaciones sexuales) sin proteccin. ANTICONCEPTIVOS DE Ophelia Charter cubierta delgada que se [place] sobe el pene (condn masculino) que se coloca durante las relaciones sexuales.  Una cubierta blanda y suelta que se coloca en la vagina (condn femenino) antes de las relaciones sexuales.  Un dispositivo de goma que se aplica sobre el cuello del tero (diafragma). Este dispositivo debe ser hecho para usted. Se coloca en la vagina antes de Clinical biochemist. Debe dejarlo colocado en la vagina durante 6 a 8 horas despus de las [employer].  Un capuchn pequeo y [employer] se fija sobre el cuello del tero (capuchn cervical). Este capuchn debe ser hecho para usted. Debe dejarlo colocado en la vagina durante 48 horas despus de las [employer].  Una esponja que se coloca en la vagina antes de Clinical biochemist.  Una sustancia qumica que destruye o impide que los espermatozoides ingresen al cuello y al tero (espermicida). La sustancia qumica puede ser en crema, gel, espuma o pldoras. DISPOSITIVO DE CONTROL INTRAUTERINO (DIU)   El DIU es un pequeo dispositivo  plstico en forma de T. Se coloca dentro del tero. Hay dos tipos de DIU:  DIU de cobre. El dispositivo est recubierto en alambre de cobre. El cobre produce un lquido que [employer] espermatozoides. Puede permanecer colocado durante 10 aos.  DIU hormonal. La hormona impide que ocurra el embarazo. Puede permanecer colocado durante 5 aos. MTODOS PERMANENTES  La mujer puede hacerse sellar, ligar u obstruir las trompas de Falopio durante [place]. Esto impide que el vulo llegue hasta el tero.  El mdico coloca un alambre diminuto o lo inserta en cada una de las trompas de [place]. Esto produce un tejido cicatrizal que obstruye las trompas de [place].  En el hombre pueden ligarse los conductos por los que pasan los espermatozoides (vasectoma). CONTROL DE LA NATALIDAD POR PLANIFICACIN FAMILIAR NATURAL   La planificacin familiar natural significa no tener Armed forces operational officer o usar un mtodo anticonceptivo de Chiropractor en los perodos frtiles de la [place].  Use a un almanaque para llevar un registro de la extensin de cada perodo y para [employer] que puede quedar [place].  Evite tener relaciones sexuales durante la ovulacin.  Use un termmetro para medir la Firefighter. Tambin reconozca los sntomas de la ovulacin.  El momento de [employer] relaciones sexuales debe ser despus de que la mujer [place]. Use condones para protegerse de las enfermedades de transmisin sexual (ETS). Hgalo independientemente del tipo de [employer] use. Hable con su mdico acerca de cul es el mejor mtodo anticonceptivo para usted. Document Released: 05/15/2010 Document Revised: 09/30/2012 [facility] Patient Information 2014 [place], [place].

## 2013-09-16 ENCOUNTER — Encounter: Payer: Self-pay | Admitting: Gynecology

## 2013-09-16 ENCOUNTER — Ambulatory Visit (INDEPENDENT_AMBULATORY_CARE_PROVIDER_SITE_OTHER): Payer: BC Managed Care – PPO | Admitting: Gynecology

## 2013-09-16 VITALS — BP 124/80 | Ht 63.0 in | Wt 148.0 lb

## 2013-09-16 DIAGNOSIS — IMO0002 Reserved for concepts with insufficient information to code with codable children: Secondary | ICD-10-CM

## 2013-09-16 DIAGNOSIS — Z8742 Personal history of other diseases of the female genital tract: Secondary | ICD-10-CM

## 2013-09-16 DIAGNOSIS — O09299 Supervision of pregnancy with other poor reproductive or obstetric history, unspecified trimester: Secondary | ICD-10-CM | POA: Insufficient documentation

## 2013-09-16 DIAGNOSIS — N921 Excessive and frequent menstruation with irregular cycle: Secondary | ICD-10-CM

## 2013-09-16 DIAGNOSIS — N809 Endometriosis, unspecified: Secondary | ICD-10-CM

## 2013-09-16 DIAGNOSIS — Z8759 Personal history of other complications of pregnancy, childbirth and the puerperium: Secondary | ICD-10-CM

## 2013-09-16 DIAGNOSIS — N920 Excessive and frequent menstruation with regular cycle: Secondary | ICD-10-CM

## 2013-09-16 MED ORDER — MEGESTROL ACETATE 40 MG PO TABS: 40.0000 mg | ORAL_TABLET | Freq: Two times a day (BID) | ORAL | Status: DC

## 2013-09-17 ENCOUNTER — Other Ambulatory Visit: Payer: Self-pay | Admitting: Gynecology

## 2013-09-17 ENCOUNTER — Encounter: Payer: Self-pay | Admitting: Gynecology

## 2013-09-17 DIAGNOSIS — N921 Excessive and frequent menstruation with irregular cycle: Secondary | ICD-10-CM

## 2013-09-17 DIAGNOSIS — N809 Endometriosis, unspecified: Secondary | ICD-10-CM

## 2013-09-17 DIAGNOSIS — IMO0002 Reserved for concepts with insufficient information to code with codable children: Secondary | ICD-10-CM

## 2013-09-17 NOTE — Progress Notes (Deleted)
    Monica Phelps [birth year] [id number]   History:    28 y.o.  for annual gyn exam ***  Past medical history,surgical history, family history and social history were all reviewed and documented in the EPIC chart.  Gynecologic History No LMP recorded. Contraception: {method:5051} Last Pap: ***. Results were: {norm/abn:16337} Last mammogram: ***. Results were: {norm/abn:16337}  Obstetric History OB History  Gravida Para Term Preterm AB SAB TAB Ectopic Multiple Living  1    1 1     0    # Outcome Date GA Lbr Len/2nd Weight Sex Delivery Anes PTL Lv  1 SAB                ROS: A ROS was performed and pertinent positives and negatives are included in the history.  GENERAL: No fevers or chills. HEENT: No change in vision, no earache, sore throat or sinus congestion. NECK: No pain or stiffness. CARDIOVASCULAR: No chest pain or pressure. No palpitations. PULMONARY: No shortness of breath, cough or wheeze. GASTROINTESTINAL: No abdominal pain, nausea, vomiting or diarrhea, melena or bright red blood per rectum. GENITOURINARY: No urinary frequency, urgency, hesitancy or dysuria. MUSCULOSKELETAL: No joint or muscle pain, no back pain, no recent trauma. DERMATOLOGIC: No rash, no itching, no lesions. ENDOCRINE: No polyuria, polydipsia, no heat or cold intolerance. No recent change in weight. HEMATOLOGICAL: No anemia or easy bruising or bleeding. NEUROLOGIC: No headache, seizures, numbness, tingling or weakness. PSYCHIATRIC: No depression, no loss of interest in normal activity or change in sleep pattern.     Exam: chaperone present  BP 124/80  Ht 5\' 3"  (1.6 m)  Wt 148 lb (67.132 kg)  BMI 26.22 kg/m2  Body mass index is 26.22 kg/(m^2).  General appearance : Well developed well nourished female. No acute distress HEENT: Neck supple, trachea midline, no carotid bruits, no thyroidmegaly Lungs: Clear to auscultation, no rhonchi or wheezes, or rib retractions  Heart: Regular rate and rhythm, no  murmurs or gallops Breast:Examined in sitting and supine position were symmetrical in appearance, no palpable masses or tenderness,  no skin retraction, no nipple inversion, no nipple discharge, no skin discoloration, no axillary or supraclavicular lymphadenopathy Abdomen: no palpable masses or tenderness, no rebound or guarding Extremities: no edema or skin discoloration or tenderness  Pelvic:  [place], Urethra, Skene Glands: Within normal limits             Vagina: No gross lesions or discharge  Cervix: No gross lesions or discharge  Uterus  ***, normal size, shape and consistency, non-tender and mobile  Adnexa  Without masses or tenderness  Anus and perineum  normal   Rectovaginal  normal sphincter tone without palpated masses or tenderness             Hemoccult ***     Assessment/Plan:  28 y.o. female for annual exam ***  Note: This dictation was prepared with  Dragon/digital dictation along Games developer. Any transcriptional errors that result from this process are unintentional.   Terrance Mass MD, 7:50 AM 09/17/2013

## 2013-09-17 NOTE — Progress Notes (Addendum)
   Patient is a 28 year old gravida 1 para 0 AB 1 who has had long-standing history of menorrhagia and menstrual cramps. Patient stated she was a virgin until she got married and had been on oral contraceptive pill and continued to have irregular bleeding. Had offered her at another practice cryotherapy of her cervix in the event that she had a chronic cervicitis. Early this year patient had a second trimester miscarriage at [redacted] weeks gestation. She had been at bedrest since 16 weeks secondary to preterm premature ruptured membranes. She continued to bleed was taken back to the operating room for a D&C was performed believing there was probably retained products of conception and she has continued to bleed. She had been placed on antibiotic for suspected chorioamnionitis leading to her preterm premature rupture membranes. She stated also says she was tested and was positive for group B strep. They had tested her for von Willebrand along with CBC and PT and PTT and all os clotting studies were normal. She also had a normal TSH. Patient been placed on Depo-Provera shortly thereafter but continued to bleed. During one of the examination she was found to have a left breast mass by the gynecologist [place] was seening  [place] who referred her to a Education officer, environmental and Tuesday she has not seen one. An ultrasound had been done and the appearance was described as a possible fibroadenoma. At the time that she did have a D&C she also had a laparoscopy whereby it was diagnosis she had endometriosis at the left uterosacral ligament and was cauterized at that time. This is the reason she was placed on Depo-Provera shortly thereafter. She's currently on no hormones or treatment.  Exam: Breast exam: Both breast exam sitting supine position left breast 2 fingerbreadths from the nipple was a 2-1/2 cm firm but mobile nontender mass. There was no supraclavicular axillary lymphadenopathy on that breast side or  contralateral side. No palpable masses on right breast. Abdomen: Soft nontender no rebound or guarding Pelvic: Bartholin urethra Skene was within normal limits Vagina: Some menstrual blood was present. Cervix: No active bleeding noted Bimanual exam: Anteverted uterus tender in the left uterosacral region no palpable masses on either ovary Rectal exam: Not done  Assessment/plan: Patient with metromenorrhagia at [facility] after spontaneous miscarriage due to preterm premature ruptured membranes at [redacted] weeks gestation with postpartum hemorrhage. Prior to pregnancy and now after miscarriage she continues to spot in between cycles along with dysmenorrhea and menorrhagia and dyspareunia. Patient states that time she has had  bruisability. Patient with diagnosed endometriosis laparoscopically recently. She will be placed on Megace 40 mg 1 by mouth twice a day for the next 2 weeks. She'll return back to the office next week for sonohysterogram. Because it was after 5:00 in my staff that the left when she returns we will check a Leiden V Factor, as well as lupus anticoagulant and antiphospholipid IgG IgA. She will use barrier contraception in the meantime. We discussed that if her sonohysterogram is normal that we should place a Mirena IUD to control her cycle as well as her pain from endometriosis for at least a year before she attempts to get pregnant again. Literature information was provided.  Patient will be referred to the general surgeon here in [facility] tissue for a second opinion on this palpable mass of the left breast and we'll try to obtain the ultrasound report from [facility].

## 2013-09-20 ENCOUNTER — Telehealth: Payer: Self-pay | Admitting: *Deleted

## 2013-09-20 DIAGNOSIS — N6320 Unspecified lump in the left breast, unspecified quadrant: Secondary | ICD-10-CM

## 2013-09-20 NOTE — Telephone Encounter (Signed)
Left message in [place] voicemail at [facility] [place] [facility] to schedule appointment and call me back with time and date.

## 2013-09-20 NOTE — Telephone Encounter (Signed)
Message copied by Thamas Jaegers on Mon Sep 20, 2013 10:30 AM ------      Message from: Terrance Mass      Created: Fri Sep 17, 2013  8:11 AM       Anderson Malta, please schedule appointment for this patient with one of the general surgeons. Patient had an ultrasound at [facility] for a left breast mass and we would need to get a copy of those report so that we can scan in half and are electronic medical records and a copy sent to the general surgeon who ever you schedule appointment with. ------

## 2013-09-22 NOTE — Telephone Encounter (Signed)
Appointment 10/04/13 @ 9:00 am Dr.Byerly

## 2013-09-24 ENCOUNTER — Encounter: Payer: Self-pay | Admitting: Gynecology

## 2013-09-24 ENCOUNTER — Ambulatory Visit (INDEPENDENT_AMBULATORY_CARE_PROVIDER_SITE_OTHER): Payer: BC Managed Care – PPO

## 2013-09-24 ENCOUNTER — Ambulatory Visit (INDEPENDENT_AMBULATORY_CARE_PROVIDER_SITE_OTHER): Payer: BC Managed Care – PPO | Admitting: Gynecology

## 2013-09-24 DIAGNOSIS — N921 Excessive and frequent menstruation with irregular cycle: Secondary | ICD-10-CM

## 2013-09-24 DIAGNOSIS — N920 Excessive and frequent menstruation with regular cycle: Secondary | ICD-10-CM

## 2013-09-24 DIAGNOSIS — N809 Endometriosis, unspecified: Secondary | ICD-10-CM

## 2013-09-24 DIAGNOSIS — N949 Unspecified condition associated with female genital organs and menstrual cycle: Secondary | ICD-10-CM

## 2013-09-24 DIAGNOSIS — N938 Other specified abnormal uterine and vaginal bleeding: Secondary | ICD-10-CM

## 2013-09-24 DIAGNOSIS — Z8742 Personal history of other diseases of the female genital tract: Secondary | ICD-10-CM

## 2013-09-24 DIAGNOSIS — IMO0002 Reserved for concepts with insufficient information to code with codable children: Secondary | ICD-10-CM

## 2013-09-24 DIAGNOSIS — R102 Pelvic and perineal pain: Secondary | ICD-10-CM

## 2013-09-24 NOTE — Patient Instructions (Signed)
Levonorgestrel intrauterine device (IUD) Qu es este medicamento? El LEVONORGESTREL (DIU) es un dispositivo anticonceptivo (control de natalidad). El dispositivo se coloca dentro del tero por un profesional de la salud. Se utiliza para Therapist, occupational y tambin se puede Risk manager para tratar el sangrado abundante que ocurre durante su perodo. Dependiendo del dispositivo, se puede utilizar por 3 a 5 aos. Este medicamento puede ser utilizado para otros usos; si tiene alguna pregunta consulte con su proveedor de atencin mdica o con su farmacutico. MARCAS COMERCIALES DISPONIBLES: Jackson Latino, Hubbard Hartshorn le debo informar a mi profesional de la salud antes de tomar este medicamento? Necesita saber si usted presenta alguno de los siguientes problemas o situaciones: -exmen de Papanicolaou anormal -cncer de mama, cuello del tero o tero -diabetes -endometritis -si tiene una infeccin plvica o genital actual o en el pasado -tiene ms de una pareja sexual o si su pareja tiene ms de una pareja -enfermedad cardiaca -antecedente de embarazo tubrico o ectpico -problemas del sistema inmunolgico -DIU colocado -enfermedad heptica o tumor del hgado -problemas con la coagulacin o si toma diluyentes sanguneos -[place] medicamentos intravenoso -forma inusual del tero -sangrado vaginal que no tiene explicacin -una reaccin alrgica o inusual al levonorgestrel, a otras hormonas, a la silicona o polietilenos, a otros medicamentos, alimentos, colorantes o conservantes -si est embarazada o buscando quedar embarazada -si est amamantando a un beb Cmo debo utilizar este medicamento? Un profesional de Estate agent este dispositivo en el tero. Hable con su pediatra para informarse acerca del uso de este medicamento en nios. Puede requerir atencin especial. Sobredosis: Pngase en contacto inmediatamente con un centro toxicolgico o una sala de urgencia si usted cree que haya tomado  demasiado medicamento. ATENCIN: [employer] es solo para usted. No comparta este medicamento con nadie. Qu sucede si me olvido de una dosis? No se aplica en este caso. Qu puede interactuar con este medicamento? No tome esta medicina con ninguno de los siguientes medicamentos: -amprenavir -bosentano -fosamprenavir Esta medicina tambin puede interactuar con los siguientes medicamentos: -aprepitant -barbitricos para producir el sueo o para el tratamiento de convulsiones -bexaroteno -griseofulvina -medicamentos para tratar los convulsiones, tales como [place], [place], [place], [place], [place], topiramato -modafinilo -pioglitazona -rifabutina -rifampicina -rifapentina -algunos medicamentos para tratar el virus VIH, tales como atazanavir, indinavir, lopinavir, nelfinavir, tipranavir, ritonavir -hierba de [place] -warfarina Puede ser que esta lista no menciona todas las posibles interacciones. Informe a su profesional de [employer]	[place] de [employer] productos a base de hierbas, medicamentos de [place] o suplementos nutritivos que est tomando. Si usted fuma, consume bebidas alcohlicas o si utiliza drogas ilegales, indqueselo tambin a su profesional de [employer]	[place]. Algunas sustancias pueden interactuar con su medicamento. A qu debo estar atento al usar [employer]? Visite a su mdico o a su profesional de la salud para chequear su evolucin peridicamente. Visite a su mdico si usted o su pareja tiene relaciones sexuales con [employer], se vuelve VIH positivo o contrae una enfermedad de transmisin sexual. Este medicamento no la protege de la infeccin por VIH (SIDA) ni de ninguna otra enfermedad de transmisin sexual. Puede controlar la ubicacin del DIU usted misma palpando con sus dedos limpios los hilos en la parte anterior de la vagina. No tire de los hilos. Es un buen hbito controlar la ubicacin del dispositivo despus de cada perodo menstrual. Si  no slo siente los hilos sino que adems siente otra parte ms del DIU o si no puede sentir los hilos, consulte a Visual merchandiser  mdico inmediatamente. El DIU puede salirse por s solo. Puede quedar embarazada si el dispositivo se sale de Chief of Staff. Utilice un mtodo anticonceptivo adicional, como preservativos, y consulte a su proveedor de atencin mdica s observa que el DIU se sali de Chief of Staff. La utilizacin de tampones no cambia la posicin del DIU y no hay inconvenientes en usarlos durante su perodo. Qu efectos secundarios puedo tener al [employer] este medicamento? Efectos secundarios que debe informar a su mdico o a Barrister's clerk de la salud tan pronto como sea posible: -Chief of Staff como erupcin cutnea, picazn o urticarias, hinchazn de la cara, labios o lengua -fiebre, sntomas gripales -llagas genitales -alta presin sangunea -ausencia de un perodo menstrual durante 6 semanas mientras lo utiliza -Social research officer, government, Occupational hygienist en las piernas -dolor o sensibilidad del plvico -dolor de cabeza repentino o severo -signos de Media planner -calambres estomacales -falta de aliento repentina -problemas de coordinacin, del habla, al caminar -sangrado, flujo vaginal inusual -color amarillento de los ojos o la piel Efectos secundarios que, por lo general, no requieren atencin mdica (debe informarlos a su mdico o a su profesional de la salud si persisten o si son molestos): -acn -dolor de pecho -cambios en el deseo sexual o capacidad -cambios de peso -calambres, Tree surgeon o sensacin de [employer] se introduce el dispositivo -dolor de cabeza -sangrado menstruales irregulares en los primeros 3 a 6 meses de usar -nuseas Puede ser que esta lista no menciona todos los posibles efectos secundarios. Comunquese a su mdico por asesoramiento mdico [employer]. Usted puede informar los efectos secundarios a la FDA por telfono al 1-800-FDA-1088. Dnde debo guardar mi  medicina? No se aplica en este caso. ATENCIN: Este folleto es un resumen. Puede ser que no cubra toda la posible informacin. Si usted tiene preguntas acerca de esta medicina, consulte con su mdico, su farmacutico o su profesional de Technical sales engineer.  2015, Elsevier/Gold Standard. (2011-03-19 16:57:41)

## 2013-09-24 NOTE — Progress Notes (Signed)
   28 year old gravida 1 para 0 Ab1 presented to the office for sonohysterogram as part of her evaluation for her chronic dysfunctional uterine bleeding. C. previous detail note from office visit 09/16/2013. She is using currently barrier contraception. She was started on Megace 40 mg twice a day until the time of the procedure today.  Ultrasound/sonohysterogram: Uterus measures 6.7 x 5.5 x 3.9 cm with an endometrial stripe of 4.4 mm. Both right and left ovaries were normal. Sterile saline was instilled into the uterine cavity and no intrauterine defects were noted.  Assessment/plan:Patient with metromenorrhagia at [facility] after spontaneous miscarriage due to preterm premature ruptured membranes at [redacted] weeks gestation with postpartum hemorrhage. Prior to pregnancy and now after miscarriage she continues to spot in between cycles along with dysmenorrhea and menorrhagia and dyspareunia. Patient states that time she has had bruisability. Patient with diagnosed endometriosis laparoscopically recently. She will be placed on Megace 40 mg 1 by mouth twice a day for the next 2 weeks. The patient previously was tested and her von Willebrand panel was normal along with CBC, PT and PTT. Today we are going to draw A. Leiden 5 factor along with antiphospholipid panel and lupus anticoagulant. She will continue on Megace but only take half a tablet twice a day due to the fact that she was having headaches while on it. She will return back to the office next week for placement of the Mirena IUD that we had discussed to help with her bleeding as well as for her endometriosis at least for a year before she attempts conception again.

## 2013-09-25 LAB — PROLACTIN: PROLACTIN: 17.3 ng/mL

## 2013-09-27 ENCOUNTER — Other Ambulatory Visit: Payer: Self-pay | Admitting: Gynecology

## 2013-09-27 ENCOUNTER — Telehealth: Payer: Self-pay | Admitting: Gynecology

## 2013-09-27 DIAGNOSIS — Z3049 Encounter for surveillance of other contraceptives: Secondary | ICD-10-CM

## 2013-09-27 MED ORDER — LEVONORGESTREL 20 MCG/24HR IU IUD: INTRAUTERINE_SYSTEM | Freq: Once | INTRAUTERINE | Status: DC

## 2013-09-27 NOTE — Telephone Encounter (Signed)
09/27/13-Pt was advised today that her [facility] covers the Mirena & insertion with only a $10.00 copay. She will call when she returns from vacation to schedule an appt with JF/wl

## 2013-09-28 LAB — FACTOR 5 LEIDEN

## 2013-09-28 LAB — ANTIPHOSPHOLIPID SYNDROME EVAL, BLD
Anticardiolipin IgA: 3 APL U/mL (ref <22)
Anticardiolipin IgG: 4 GPL U/mL (ref <23)
Anticardiolipin IgM: 1 MPL U/mL (ref <11)
DRVVT: 36.3 s (ref <42.9)
LUPUS ANTICOAGULANT: NOT DETECTED
PHOSPHATIDYLSERINE IGG AUTOANTIBODIES: 3 U/mL (ref <16)
PTT Lupus Anticoagulant: 32.3 secs (ref 28.0–43.0)
Phosphatydalserine, IgA: 8 U/mL (ref <20)
Phosphatydalserine, IgM: 9 U/mL (ref <22)

## 2013-10-04 ENCOUNTER — Encounter (INDEPENDENT_AMBULATORY_CARE_PROVIDER_SITE_OTHER): Payer: Self-pay | Admitting: General Surgery

## 2013-10-04 ENCOUNTER — Ambulatory Visit (INDEPENDENT_AMBULATORY_CARE_PROVIDER_SITE_OTHER): Payer: BC Managed Care – PPO | Admitting: General Surgery

## 2013-10-04 VITALS — BP 110/70 | HR 71 | Temp 97.2°F | Resp 16 | Ht 63.0 in | Wt 148.2 lb

## 2013-10-04 DIAGNOSIS — N63 Unspecified lump in unspecified breast: Secondary | ICD-10-CM

## 2013-10-04 DIAGNOSIS — N6320 Unspecified lump in the left breast, unspecified quadrant: Secondary | ICD-10-CM | POA: Insufficient documentation

## 2013-10-04 NOTE — Patient Instructions (Signed)
Biopsia de mama (Breast Biopsy) Una biopsia de mama es un procedimiento en el cual se extrae Truddie Coco de tejido de la mama. El tejido se examina bajo el microscopio para determinar si hay clulas cancerosas. Una biopsia de mama se realiza cuando hay:  Un bulto en la mama no diagnosticado (tumor).  Anormalidades, hundimientos, costras o ulceraciones en el pezn.  Secrecin anormal del pezn, Dealer.  Enrojecimiento, hinchazn y G. L. Garcia.  Depsitos de calcio (calcificaciones) o anormalidades observadas en Lavinia Sharps, ecografa, o en la resonancia magntica (IRM).  Cambios sospechosos en la mama que se observan en la mamografa. Si se descubre que el tumor es canceroso (maligno) una biopsia de mama puede ayudar a Teacher, adult education cul es el mejor tratamiento para usted. Hay diferentes tipos de biopsia de mama. Hable con su mdico acerca de las opciones y cul es la mejor para usted.  INFORME A SU MDICO SOBRE:   Alergias a alimentos o medicamentos.  Medicamentos que [place], incluyendo vitaminas, hierbas, gotas oftlmicas, medicamentos de venta libre y cremas.  Uso de corticoides (por va oral o cremas).  Problemas anteriores debido a anestsicos o a medicamentos que [employer] sensibilidad.  Antecedentes de hemorragias o cogulos sanguneos.  Cirugas anteriores.  Otros problemas de salud, incluyendo diabetes y problemas renales.  Resfros o infecciones recientes.  Posibilidad de embarazo, si corresponde. [place].  Infeccin.  Reaccin alrgica a los medicamentos.  Hematomas e inflamacin de la mama.  Alteracin en la forma de la mama.  No se encuentra el bulto o la anormalidad.  Necesidad de [place]. ANTES DEL PROCEDIMIENTO   Pdale a alguna persona que la lleve a su casa luego del procedimiento.  No fume al menos las 2 semanas previas al procedimiento. Si fuma, abandone el hbito.  No beba  alcohol durante las 24 horas previas al procedimiento.  Use un buen sostn para el procedimiento. PROCEDIMIENTO  Le administrarn medicamentos para adormecer el rea de la mama (anestesia local) o medicamentos para dormir durante el procedimiento (anestesia general). Los siguientes son los diferentes tipos de biopsia que se pueden Optometrist.   Aspiracin con aguja fina: se inserta una aguja delgada con Clent Jacks en el tumor de la mama. Con ella se extraen lquido y clulas que luego se observan en el microscopio. Si el tumor no se palpa, se realizar una ecografa para localizar el tumor y Catering manager en el rea correcta.   Biopsia con aguja gruesa: Maxwell Caul de seccin amplia (aguja gruesa) se inserta en el tumor entre 3 y 20 veces para obtener muestras de tejido. Se toma la muestra de tejido. La aguja se Water quality scientist correcto mediante el uso de una ecografa o una radiografa.   Biopsia estereotctica: se utilizan equipos radiogrficos y Ardelia Mems computadora para Physiological scientist imgenes de la tumoracin [place]. La computadora encuentra exactamente el ncleo en el que se debe insertar la aguja. Se toma una muestra de tejido.   Biopsia asistida por vaco: se realiza una pequea incisin (menos de  de pulgada [0,6 cm] ) en la mama. Un equipo para biopsia que incluye una [place] y un suctor se pasan a travs de la incisin dentro del tejido [place]. El suctor suavemente drena tejido mamario anormal hacia la aguja para extraerlo. Este tipo de biopsia extrae una muestra mayor de tejido que aquel que se extrae habitualmente con la biopsia con Oletta Lamas. No se necesitan puntos de sutura y habitualmente  deja una cicatriz pequea.  Biopsia con aguja gruesa guiada por ultrasonido-Un ultrasonido de alta frecuencia gua a la aguja gruesa al rea de la masa o anormalidad. Se hace una incisin para insertar la aguja. Se toma una muestra de tejido.  Biopsia abierta: se hace una incisin grande en la  mama. El mdico intentar extirpar todo el tumor de la mama o todo lo que pueda. DESPUS DEL PROCEDIMIENTO   La conducirn a la zona de recuperacin. Cuando se encuentre bien y no tenga problemas, podr volver a Medical illustrator.  Podr notar hematomas en la mama. Esto es normal.  El mdico puede aplicar un vendaje compresivo sobre la mama durante 24 - 98 horas. El vendaje compresivo se ajuste de manera apretada alrededor del trax para evitar que se acumule lquido debajo de los tejidos.

## 2013-10-04 NOTE — Progress Notes (Signed)
Patient ID: Monica Phelps, female   DOB: 1985-08-09, 28 y.o.   MRN: [id number]  Chief complaint:  Left breast mass.   HPI Monica Phelps is a 28 y.o. female.   HPI  [name] is a 28 year old female who presents with a 6 month history of a left breast mass. She stated that when it was first discovered it was quite sore, but the soreness has resolved. She does feel like it is getting larger. She thinks it was around a centimeter when she first noted it and now it is around 3 cm. She has extended family members with a history of breast cancer but no first degree relatives. 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 She states that she had some abnormal uterine bleeding around the time that this was discovered. She was placed on progesterone to stop the bleeding and it did seem to make the mass get bigger at that time. Since she stopped the progesterone, the mass has stabilized. She no longer has any discomfort.  Past Medical History  Diagnosis Date  . Reflux   . UTI (lower urinary tract infection)   . Constipation   . Gastritis   . Fibroid   . History of bleeding disorder as a child   . Diabetes mellitus without complication   . Asthma   . Headache(784.0)     migraines  . GERD (gastroesophageal reflux disease)   . Anxiety     paniac  . Blood dyscrasia     pt states hemorrhaged with last period 12/14 and was hospitalized in [place]  . Endometriosis     Past Surgical History  Procedure Laterality Date  . No past surgeries    . Dilation and curettage of uterus    . Induced abortion      at 5 months fetal anomalies hemorrhaged afterwards  . Dilation and curettage of uterus N/A 03/03/2013    Procedure: DILATATION AND CURETTAGE;  Surgeon: Emily Filbert, MD;  Location: [facility];  Service: Gynecology;  Laterality: N/A;  . Laparoscopy N/A 03/03/2013    Procedure: LAPAROSCOPY DIAGNOSTIC;  Surgeon: Emily Filbert, MD;  Location: [facility];   Service: Gynecology;  Laterality: N/A;  . Laparoscopic lysis of adhesions N/A 03/03/2013    Procedure: LAPAROSCOPIC LYSIS OF ADHESIONS;  Surgeon: Emily Filbert, MD;  Location: [facility];  Service: Gynecology;  Laterality: N/A;    Family History  Problem Relation Age of Onset  . Alcohol abuse Neg Hx   . Arthritis Neg Hx   . Asthma Neg Hx   . Birth defects Neg Hx   . Cancer Neg Hx   . Depression Neg Hx   . COPD Neg Hx   . Diabetes Neg Hx   . Drug abuse Neg Hx   . Early death Neg Hx   . Hearing loss Neg Hx   . Kidney disease Neg Hx   . Learning disabilities Neg Hx   . Mental illness Neg Hx   . Mental retardation Neg Hx   . Miscarriages / Stillbirths Neg Hx   . Vision loss Neg Hx   . Hypertension Mother   . Heart disease Mother   . Stroke Mother   . Hyperlipidemia Father     Social History History  Substance Use Topics  . Smoking status: Never Smoker   . Smokeless tobacco: Never Used  . Alcohol Use: No    Allergies  Allergen Reactions  . Dilaudid [Hydromorphone Hcl] Anaphylaxis and Nausea And Vomiting  . Morphine And Related Shortness Of Breath and Nausea And Vomiting  . Latex Itching    Current Outpatient Prescriptions  Medication Sig Dispense Refill  . folic acid (FOLVITE) 099 MCG tablet Take 800 mcg by mouth daily.      . megestrol (MEGACE) 40 MG tablet Take 1 tablet (40 mg total) by mouth 2 (two) times daily.  20 tablet  1   Current Facility-Administered Medications  Medication Dose Route Frequency Provider Last Rate Last Dose  . levonorgestrel (MIRENA) 20 MCG/24HR IUD   Intrauterine Once Terrance Mass, MD        Review of Systems Review of Systems  Cardiovascular: Positive for chest pain.  Genitourinary: Positive for vaginal bleeding.  All other systems reviewed and are negative.   Blood pressure 110/70, pulse 71, temperature 97.2 F (36.2 C), temperature source Temporal, resp. rate 16, height 5\' 3"  (1.6 m), weight 148 lb 3.2 oz (67.223 kg).  Physical  Exam Physical Exam  Constitutional: She is oriented to person, place, and time. She appears well-developed and well-nourished. No distress.  HENT:  Head: Normocephalic and atraumatic.  Eyes: Conjunctivae are normal. Pupils are equal, round, and reactive to light. Right eye exhibits no discharge. Left eye exhibits no discharge. No scleral icterus.  Neck: Normal range of motion. Neck supple. No tracheal deviation present. No thyromegaly present.  Cardiovascular: Normal rate, regular rhythm, normal heart sounds and intact distal pulses.   No murmur heard. Pulmonary/Chest: Effort normal and breath sounds normal. No respiratory distress. She exhibits no tenderness. Right breast exhibits no inverted nipple, no mass, no nipple discharge, no skin change and no tenderness. Left breast exhibits mass. Left breast exhibits no inverted nipple, no nipple discharge, no skin change and no tenderness. Breasts are symmetrical.    2.5 cm mass at 1 o'clock Relatively dense tissue bilaterally  Abdominal: Soft. Bowel sounds are normal. She exhibits no distension. There is no tenderness.  Musculoskeletal: Normal range of motion. She exhibits no edema and no tenderness.  Lymphadenopathy:    She has no cervical adenopathy.  Neurological: She is alert and oriented to person, place, and time. Coordination normal.  Skin: Skin is warm and dry. No rash noted. She is not diaphoretic. No erythema. No pallor.  Psychiatric: She has a normal mood and affect. Her behavior is normal. Judgment and thought content normal.    Data Reviewed Ultrasound 08/26/2013 [facility] 2.94x1.74x3.13 cm mass probably fibroadenoma  Assessment/Plan    Left breast mass, probable fibroadenoma This mass is most likely a fibroadenoma based on age, natural history, and imaging. However, since it is enlarging we will plan to remove it. I reviewed the surgery with the patient. I discussed that we would make a circumareolar incision in the  upper outer quadrant of the left breast.  I advised her that this is an outpatient procedure. I discussed that I recommend no heavy lifting or strenuous activity for a week. She discussed having a "bleeding disorder", but on further discussion, it appeared that this was related to vaginal bleeding that resolved with progesterone. This is not appear to reflect an actual bleeding diathesis.  The surgical procedure was described to the patient.  I discussed the incision type and location.  The risks and benefits of the procedure were described to the patient and she wishes to proceed.    We discussed the risks bleeding, infection, damage to other structures, need for  further procedures/surgeries.  We discussed the risk of seroma.  The patient was advised if the area in the breast in cancer, we may need to go back to surgery for additional tissue to obtain negative margins or for a lymph node biopsy. The patient was advised that these are the most common complications, but that others can occur as well.  They were advised against taking aspirin or other anti-inflammatory agents/blood thinners the week before surgery.  We discussed post op pain control.         Isaih Bulger 10/04/2013, 10:35 AM

## 2013-10-04 NOTE — Assessment & Plan Note (Signed)
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 However, since it is enlarging we will plan to remove it. I reviewed the surgery with the patient. I discussed that we would make a circumareolar incision in the upper outer quadrant of the left breast.  I advised her that this is an outpatient procedure. I discussed that I recommend no heavy lifting or strenuous activity for a week. She discussed having a "bleeding disorder", but on further discussion, it appeared that this was related to vaginal bleeding that resolved with progesterone. This is not appear to reflect an actual bleeding diathesis.  The surgical procedure was described to the patient.  I discussed the incision type and location.  The risks and benefits of the procedure were described to the patient and she wishes to proceed.    We discussed the risks bleeding, infection, damage to other structures, need for further procedures/surgeries.  We discussed the risk of seroma.  The patient was advised if the area in the breast in cancer, we may need to go back to surgery for additional tissue to obtain negative margins or for a lymph node biopsy. The patient was advised that these are the most common complications, but that others can occur as well.  They were advised against taking aspirin or other anti-inflammatory agents/blood thinners the week before surgery.  We discussed post op pain control.

## 2013-10-05 ENCOUNTER — Ambulatory Visit: Payer: Self-pay | Admitting: Gynecology

## 2013-10-07 ENCOUNTER — Telehealth: Payer: Self-pay | Admitting: *Deleted

## 2013-10-07 NOTE — Telephone Encounter (Signed)
(  Pt aware you are out of the office) pt called to get lab result from 09/24/13 visit. Was there anything specific you wanted to relay to pt regarding labs? Please advise

## 2013-10-07 NOTE — Telephone Encounter (Signed)
You can tell her all her labs were normal BUT according to my note I had ordered Leiden V Factor blood test as well. Please check with Elmyra Ricks. I did not see it in the lab results

## 2013-10-08 NOTE — Telephone Encounter (Signed)
Pt informed with the below note. 

## 2013-10-12 ENCOUNTER — Ambulatory Visit: Payer: BC Managed Care – PPO | Admitting: Gynecology

## 2013-10-20 ENCOUNTER — Encounter (HOSPITAL_BASED_OUTPATIENT_CLINIC_OR_DEPARTMENT_OTHER): Payer: Self-pay | Admitting: *Deleted

## 2013-10-20 NOTE — Progress Notes (Signed)
Patient states that she has been having some fluttering in her chest with mild discomfort. States she went to [facility] ED and was told she had sl irreg heartbeats and was told to follow up with her family doctor. Patient states she has an appt with Dr Jerilee Hoh Friday 10-22-13. Patient states she will call us after appt with results.

## 2013-10-20 NOTE — Progress Notes (Signed)
Correction,to previous note, physician is Dr Toney Rakes.

## 2013-10-22 ENCOUNTER — Ambulatory Visit (INDEPENDENT_AMBULATORY_CARE_PROVIDER_SITE_OTHER): Payer: BC Managed Care – PPO | Admitting: Cardiology

## 2013-10-22 ENCOUNTER — Encounter: Payer: Self-pay | Admitting: Cardiology

## 2013-10-22 ENCOUNTER — Telehealth: Payer: Self-pay

## 2013-10-22 ENCOUNTER — Encounter: Payer: Self-pay | Admitting: Gynecology

## 2013-10-22 ENCOUNTER — Encounter: Payer: Self-pay | Admitting: *Deleted

## 2013-10-22 ENCOUNTER — Ambulatory Visit (INDEPENDENT_AMBULATORY_CARE_PROVIDER_SITE_OTHER): Payer: BC Managed Care – PPO | Admitting: Gynecology

## 2013-10-22 ENCOUNTER — Ambulatory Visit (HOSPITAL_COMMUNITY): Payer: BC Managed Care – PPO | Attending: Gynecology | Admitting: Radiology

## 2013-10-22 ENCOUNTER — Other Ambulatory Visit: Payer: Self-pay | Admitting: *Deleted

## 2013-10-22 VITALS — BP 118/72

## 2013-10-22 VITALS — BP 120/74 | HR 69 | Ht 63.0 in | Wt 149.0 lb

## 2013-10-22 DIAGNOSIS — N949 Unspecified condition associated with female genital organs and menstrual cycle: Secondary | ICD-10-CM

## 2013-10-22 DIAGNOSIS — R072 Precordial pain: Secondary | ICD-10-CM | POA: Diagnosis not present

## 2013-10-22 DIAGNOSIS — R002 Palpitations: Secondary | ICD-10-CM

## 2013-10-22 DIAGNOSIS — R079 Chest pain, unspecified: Secondary | ICD-10-CM

## 2013-10-22 DIAGNOSIS — N938 Other specified abnormal uterine and vaginal bleeding: Secondary | ICD-10-CM

## 2013-10-22 DIAGNOSIS — N63 Unspecified lump in unspecified breast: Secondary | ICD-10-CM

## 2013-10-22 DIAGNOSIS — Z8249 Family history of ischemic heart disease and other diseases of the circulatory system: Secondary | ICD-10-CM | POA: Insufficient documentation

## 2013-10-22 NOTE — Progress Notes (Signed)
   Patient is a 28 year old gravida 1 para 0 AB 1 with a long-standing history of menorrhagia and menstrual cramps has been seen in the office recently on several occasions for workup. Please see detailed notes in Epic. She's had no further bleeding over the past 8 days since she's been off the Megace. The reason for her office visit today is that she's been complaining of palpitations and her chest which radiates to her back along with numbness and tingling in her left arm. She denied any shortness of breath. She had similar episode 2 months ago which took her to the emergency room at [facility] and she had mentioned that they thought that she had some cardiac arrhythmia but is currently on no medication. She does have a very strong family history of cardiovascular disease. Her mother had a cerebral stroke. As part of her evaluation for her menorrhagia she has had the following test done: CBC, PT, PTT, fibrinogen, antiphospholipid antibodies IgG and and IgM as well as lupus anticoagulant and Leiden V factor all normal.The patient has history of an 18 week premature delivery of a nonviable fetus as a result of preterm premature rupture of membranes. She has sustained postpartum hemorrhage at that time. She is scheduled to undergo a left breast mass excision that has been causing her discomfort and a general surgeon believes is a fibroadenoma. Her surgery scheduled for next Wednesday.  Patient's only medication currently is Paxil 10 mg by mouth daily because of history of anxiety in the past.  Exam: HEENT unremarkable Lungs: Clear to auscultation her heart is wheezes Heart: Regular rate and rhythm abdomen mild systolic ejection murmur grade 1/6, no gallops Extremities: No edema  Assessment/plan: #1 patient will be referred to cardiologist for clearance before her planned surgery next week of having a left breast mass removed by the general surgeon. I have asked aren't cardiology colleagues  to evaluate her because of her symptoms of palpitations and numbness and tingling in her left arm and strong family history of cardiovascular disease. #2 patient will return to the office in a few weeks to place a Mirena IUD to help with her bleeding situation as well as for contraception.

## 2013-10-22 NOTE — Telephone Encounter (Signed)
I spoke with patient and let her know I have scheduled her with cardiologist, Dr. Meda Coffee, at [facility] for 3:30p today. She was instructed to check in at 3:00p and bring ins card and pic ID.

## 2013-10-22 NOTE — Progress Notes (Signed)
Patient ID: Monica Phelps, female   DOB: [birth year], 28 y.o.   MRN: [id number]     Patient Name: Monica Phelps Date of Encounter: 10/22/2013  Primary Care Provider:  No primary provider on file. Primary Cardiologist:  Dorothy Spark  Problem List   Past Medical History  Diagnosis Date  . Reflux   . UTI (lower urinary tract infection)   . Constipation   . Gastritis   . Fibroid   . History of bleeding disorder as a child   . Asthma   . Headache(784.0)     migraines  . GERD (gastroesophageal reflux disease)   . Anxiety     paniac  . Blood dyscrasia     pt states hemorrhaged with last period 12/14 and was hospitalized in [place]  . Endometriosis   . Complication of anesthesia   . PONV (postoperative nausea and vomiting)    Past Surgical History  Procedure Laterality Date  . Dilation and curettage of uterus    . Induced abortion      at 5 months fetal anomalies hemorrhaged afterwards  . Dilation and curettage of uterus N/A 03/03/2013    Procedure: DILATATION AND CURETTAGE;  Surgeon: Emily Filbert, MD;  Location: [facility];  Service: Gynecology;  Laterality: N/A;  . Laparoscopy N/A 03/03/2013    Procedure: LAPAROSCOPY DIAGNOSTIC;  Surgeon: Emily Filbert, MD;  Location: [facility];  Service: Gynecology;  Laterality: N/A;  . Laparoscopic lysis of adhesions N/A 03/03/2013    Procedure: LAPAROSCOPIC LYSIS OF ADHESIONS;  Surgeon: Emily Filbert, MD;  Location: [facility];  Service: Gynecology;  Laterality: N/A;    Allergies  Allergies  Allergen Reactions  . Dilaudid [Hydromorphone Hcl] Anaphylaxis and Nausea And Vomiting  . Morphine And Related Shortness Of Breath and Nausea And Vomiting  . Latex Itching    HPI  Patient is a 28 year old female with no prior cardiac history who is coming concerned about palpitations. The patient spontaneously aborted her baby at 5 months of pregnancy. She has been worked up for hypercoagulable state including antiphospholipid antibody, lupus  anticoagulant, factor V Leiden and it was all negative.The reason for her office visit today is that she's been complaining of palpitations and her chest which radiates to her back along with numbness and tingling in her left arm. She denied any shortness of breath. She had similar episode 2 months ago which took her to the emergency room at [facility] and she had mentioned that they thought that she had some cardiac arrhythmia but is currently on no medication. She doesn't know any further details. She has chest CTA negative for pulmonary embolism. She is experiencing palpitations daily. She denies any prior syncope, no SOB on exertion, no LE edema, orthopnea or PND.  The patient is scheduled for a breast mass removal and needs preoperative evaluation.   Home Medications  Prior to Admission medications   Medication Sig Start Date End Date Taking? Authorizing Provider  PARoxetine (PAXIL) 10 MG tablet Take 10 mg by mouth daily. 08/18/13 08/18/14 Yes Historical Provider, MD    Family History  Family History  Problem Relation Age of Onset  . Alcohol abuse Neg Hx   . Arthritis Neg Hx   . Asthma Neg Hx   . Birth defects Neg Hx   . Cancer Neg Hx   . Depression Neg Hx   . COPD Neg Hx   . Diabetes Neg Hx   . Drug abuse Neg Hx   .  Early death Neg Hx   . Hearing loss Neg Hx   . Kidney disease Neg Hx   . Learning disabilities Neg Hx   . Mental illness Neg Hx   . Mental retardation Neg Hx   . Miscarriages / Stillbirths Neg Hx   . Vision loss Neg Hx   . Hypertension Mother   . Heart disease Mother   . Stroke Mother   . Hyperlipidemia Father     Social History  History   Social History  . Marital Status: Married    Spouse Name: N/A    Number of Children: N/A  . Years of Education: N/A   Occupational History  . Not on file.   Social History Main Topics  . Smoking status: Never Smoker   . Smokeless tobacco: Never Used  . Alcohol Use: No  . Drug Use: No  . Sexual  Activity: Yes    Partners: Male     Comment: one week ago   Other Topics Concern  . Not on file   Social History Narrative  . No narrative on file     Review of Systems, as per HPI, otherwise negative General:  No chills, fever, night sweats or weight changes.  Cardiovascular:  No chest pain, dyspnea on exertion, edema, orthopnea, palpitations, paroxysmal nocturnal dyspnea. Dermatological: No rash, lesions/masses Respiratory: No cough, dyspnea Urologic: No hematuria, dysuria Abdominal:   No nausea, vomiting, diarrhea, bright red blood per rectum, melena, or hematemesis Neurologic:  No visual changes, wkns, changes in mental status. All other systems reviewed and are otherwise negative except as noted above.  Physical Exam  Blood pressure 120/74, pulse 69, height 5\' 3"  (1.6 m), weight 149 lb (67.586 kg), last menstrual period 10/11/2013.  General: Pleasant, NAD Psych: Normal affect. Neuro: Alert and oriented X 3. Moves all extremities spontaneously. HEENT: Normal  Neck: Supple without bruits or JVD. Lungs:  Resp regular and unlabored, CTA. Heart: RRR no s3, s4, or murmurs. Abdomen: Soft, non-tender, non-distended, BS + x 4.  Extremities: No clubbing, cyanosis or edema. DP/PT/Radials 2+ and equal bilaterally.  Labs:  No results found for this basename: CKTOTAL, CKMB, TROPONINI,  in the last 72 hours Lab Results  Component Value Date   WBC 6.7 02/28/2013   HGB 12.9 02/28/2013   HCT 37.7 02/28/2013   MCV 89.5 02/28/2013   PLT 281 02/28/2013    Lab Results  Component Value Date   DDIMER  Value: <0.22        AT THE INHOUSE ESTABLISHED CUTOFF VALUE OF 0.48 ug/mL FEU, THIS ASSAY HAS BEEN DOCUMENTED IN THE LITERATURE TO HAVE 12/08/2006   No components found with this basename: POCBNP,     Component Value Date/Time   NA 135 10/20/2012 2231   K 3.5 10/20/2012 2231   CL 100 10/20/2012 2231   CO2 26 10/20/2012 2231   GLUCOSE 97 10/20/2012 2231   BUN 9 10/20/2012 2231   CREATININE 0.60  10/20/2012 2231   CALCIUM 9.2 10/20/2012 2231   PROT 6.6 10/20/2012 2231   ALBUMIN 3.4* 10/20/2012 2231   AST 15 10/20/2012 2231   ALT 16 10/20/2012 2231   ALKPHOS 54 10/20/2012 2231   BILITOT 0.3 10/20/2012 2231   GFRNONAA >90 10/20/2012 2231   GFRAA >90 10/20/2012 2231   No results found for this basename: CHOL, HDL, LDLCALC, TRIG    Accessory Clinical Findings  Echocardiogram - none  ECG - SR, normal ECG    Assessment & Plan  28 year  old female  1. Palpitations - we will order 24 Holter monitor, CMP, TSH, we will also order an echocardiogram to rule out structural heart disease. We will provide preoperative evaluation considering her echocardiogram is normal.  Follow up in 3 weeks.    Dorothy Spark, MD, [facility] 10/22/2013, 3:43 PM

## 2013-10-22 NOTE — Progress Notes (Signed)
Echocardiogram performed.  

## 2013-10-22 NOTE — Patient Instructions (Signed)
Your physician recommends that you continue on your current medications as directed. Please refer to the Current Medication list given to you today.  Your physician has recommended that you wear a 24 HOUR holter monitor. Holter monitors are medical devices that record the heart's electrical activity. Doctors most often use these monitors to diagnose arrhythmias. Arrhythmias are problems with the speed or rhythm of the heartbeat. The monitor is a small, portable device. You can wear one while you do your normal daily activities. This is usually used to diagnose what is causing palpitations/syncope (passing out).    Your physician has requested that you have an echocardiogram TODAY. Echocardiography is a painless test that uses sound waves to create images of your heart. It provides your doctor with information about the size and shape of your heart and how well your heart's chambers and valves are working. This procedure takes approximately one hour. There are no restrictions for this procedure.  WE REQUESTED [facility] FOR FURTHER REVIEW OF YOUR RECENT CARDIAC WORK-UP    Your physician recommends that you schedule a follow-up appointment in: WITH DR NELSON AT NEXT AVAILABLE

## 2013-10-25 ENCOUNTER — Encounter: Payer: Self-pay | Admitting: *Deleted

## 2013-10-25 ENCOUNTER — Encounter (INDEPENDENT_AMBULATORY_CARE_PROVIDER_SITE_OTHER): Payer: BC Managed Care – PPO

## 2013-10-25 DIAGNOSIS — R079 Chest pain, unspecified: Secondary | ICD-10-CM

## 2013-10-25 DIAGNOSIS — R002 Palpitations: Secondary | ICD-10-CM

## 2013-10-25 NOTE — Progress Notes (Signed)
Patient ID: Monica Phelps, female   DOB: Sep 04, 1985, 28 y.o.   MRN: [id number] E-Cardio 24 hour holter monitor applied to patient.

## 2013-10-27 ENCOUNTER — Ambulatory Visit (HOSPITAL_BASED_OUTPATIENT_CLINIC_OR_DEPARTMENT_OTHER): Payer: BC Managed Care – PPO | Admitting: Anesthesiology

## 2013-10-27 ENCOUNTER — Ambulatory Visit (HOSPITAL_BASED_OUTPATIENT_CLINIC_OR_DEPARTMENT_OTHER)
Admission: RE | Admit: 2013-10-27 | Discharge: 2013-10-27 | Disposition: A | Discharge status: Home / Self Care | Payer: BC Managed Care – PPO | Source: Ambulatory Visit | Attending: General Surgery | Admitting: General Surgery

## 2013-10-27 ENCOUNTER — Encounter (HOSPITAL_BASED_OUTPATIENT_CLINIC_OR_DEPARTMENT_OTHER)
Admission: RE | Disposition: A | Discharge status: Home / Self Care | Payer: Self-pay | Source: Ambulatory Visit | Attending: General Surgery

## 2013-10-27 ENCOUNTER — Encounter (HOSPITAL_BASED_OUTPATIENT_CLINIC_OR_DEPARTMENT_OTHER): Payer: Self-pay | Admitting: *Deleted

## 2013-10-27 ENCOUNTER — Encounter (HOSPITAL_BASED_OUTPATIENT_CLINIC_OR_DEPARTMENT_OTHER): Payer: BC Managed Care – PPO | Admitting: Anesthesiology

## 2013-10-27 DIAGNOSIS — Z885 Allergy status to narcotic agent status: Secondary | ICD-10-CM | POA: Insufficient documentation

## 2013-10-27 DIAGNOSIS — F411 Generalized anxiety disorder: Secondary | ICD-10-CM | POA: Insufficient documentation

## 2013-10-27 DIAGNOSIS — Z79899 Other long term (current) drug therapy: Secondary | ICD-10-CM | POA: Insufficient documentation

## 2013-10-27 DIAGNOSIS — Z803 Family history of malignant neoplasm of breast: Secondary | ICD-10-CM | POA: Diagnosis not present

## 2013-10-27 DIAGNOSIS — Z91040 Latex allergy status: Secondary | ICD-10-CM | POA: Insufficient documentation

## 2013-10-27 DIAGNOSIS — D249 Benign neoplasm of unspecified breast: Secondary | ICD-10-CM | POA: Diagnosis not present

## 2013-10-27 DIAGNOSIS — E119 Type 2 diabetes mellitus without complications: Secondary | ICD-10-CM | POA: Diagnosis not present

## 2013-10-27 DIAGNOSIS — K5900 Constipation, unspecified: Secondary | ICD-10-CM | POA: Insufficient documentation

## 2013-10-27 DIAGNOSIS — K219 Gastro-esophageal reflux disease without esophagitis: Secondary | ICD-10-CM | POA: Diagnosis not present

## 2013-10-27 DIAGNOSIS — N63 Unspecified lump in unspecified breast: Secondary | ICD-10-CM | POA: Diagnosis present

## 2013-10-27 HISTORY — DX: Adverse effect of unspecified anesthetic, initial encounter: T41.45XA

## 2013-10-27 HISTORY — PX: MASS EXCISION: SHX2000

## 2013-10-27 HISTORY — DX: Other complications of anesthesia, initial encounter: T88.59XA

## 2013-10-27 HISTORY — DX: Other specified postprocedural states: Z98.890

## 2013-10-27 HISTORY — DX: Other specified postprocedural states: R11.2

## 2013-10-27 LAB — POCT HEMOGLOBIN-HEMACUE: HEMOGLOBIN: 14 g/dL (ref 12.0–15.0)

## 2013-10-27 SURGERY — EXCISION MASS
Anesthesia: General | Site: Breast | Laterality: Left

## 2013-10-27 MED ORDER — SODIUM CHLORIDE 0.9 % IJ SOLN: 3.0000 mL | INTRAMUSCULAR | Status: DC | PRN

## 2013-10-27 MED ORDER — OXYCODONE-ACETAMINOPHEN 5-325 MG PO TABS: 1.0000 | ORAL_TABLET | Freq: Four times a day (QID) | ORAL | Status: DC | PRN

## 2013-10-27 MED ORDER — MIDAZOLAM HCL 5 MG/5ML IJ SOLN
INTRAMUSCULAR | Status: DC | PRN
  Administered 2013-10-27: 2 mg via INTRAVENOUS

## 2013-10-27 MED ORDER — SCOPOLAMINE 1 MG/3DAYS TD PT72
1.0000 | MEDICATED_PATCH | TRANSDERMAL | Status: DC
  Administered 2013-10-27: 1.5 mg via TRANSDERMAL

## 2013-10-27 MED ORDER — ACETAMINOPHEN 650 MG RE SUPP: 650.0000 mg | RECTAL | Status: DC | PRN

## 2013-10-27 MED ORDER — OXYCODONE HCL 5 MG/5ML PO SOLN: 5.0000 mg | Freq: Once | ORAL | Status: DC | PRN

## 2013-10-27 MED ORDER — ONDANSETRON HCL 4 MG/2ML IJ SOLN: 4.0000 mg | Freq: Once | INTRAMUSCULAR | Status: DC | PRN

## 2013-10-27 MED ORDER — BUPIVACAINE-EPINEPHRINE (PF) 0.25% -1:200000 IJ SOLN
INTRAMUSCULAR | Status: DC | PRN
  Administered 2013-10-27: 14:00:00 via INTRADERMAL

## 2013-10-27 MED ORDER — PROPOFOL 10 MG/ML IV BOLUS
INTRAVENOUS | Status: DC | PRN
  Administered 2013-10-27: 200 mg via INTRAVENOUS

## 2013-10-27 MED ORDER — MIDAZOLAM HCL 2 MG/2ML IJ SOLN: 1.0000 mg | INTRAMUSCULAR | Status: DC | PRN

## 2013-10-27 MED ORDER — HYDROMORPHONE HCL 1 MG/ML IJ SOLN: 0.2500 mg | INTRAMUSCULAR | Status: DC | PRN

## 2013-10-27 MED ORDER — LACTATED RINGERS IV SOLN
INTRAVENOUS | Status: DC
  Administered 2013-10-27 (×2): via INTRAVENOUS

## 2013-10-27 MED ORDER — LIDOCAINE HCL (CARDIAC) 20 MG/ML IV SOLN
INTRAVENOUS | Status: DC | PRN
  Administered 2013-10-27: 50 mg via INTRAVENOUS

## 2013-10-27 MED ORDER — ACETAMINOPHEN 325 MG PO TABS: 650.0000 mg | ORAL_TABLET | ORAL | Status: DC | PRN

## 2013-10-27 MED ORDER — FENTANYL CITRATE 0.05 MG/ML IJ SOLN
25.0000 ug | INTRAMUSCULAR | Status: DC | PRN
  Administered 2013-10-27 (×3): 25 ug via INTRAVENOUS

## 2013-10-27 MED ORDER — MIDAZOLAM HCL 2 MG/2ML IJ SOLN
INTRAMUSCULAR | Status: AC
  Filled 2013-10-27: qty 2

## 2013-10-27 MED ORDER — LIDOCAINE HCL (PF) 1 % IJ SOLN
INTRAMUSCULAR | Status: AC
  Filled 2013-10-27: qty 30

## 2013-10-27 MED ORDER — OXYCODONE HCL 5 MG PO TABS: 5.0000 mg | ORAL_TABLET | Freq: Once | ORAL | Status: DC | PRN

## 2013-10-27 MED ORDER — FENTANYL CITRATE 0.05 MG/ML IJ SOLN
INTRAMUSCULAR | Status: AC
  Filled 2013-10-27: qty 6

## 2013-10-27 MED ORDER — CEFAZOLIN SODIUM-DEXTROSE 2-3 GM-% IV SOLR
2.0000 g | INTRAVENOUS | Status: AC
  Administered 2013-10-27: 2 g via INTRAVENOUS

## 2013-10-27 MED ORDER — FENTANYL CITRATE 0.05 MG/ML IJ SOLN: 50.0000 ug | INTRAMUSCULAR | Status: DC | PRN

## 2013-10-27 MED ORDER — SCOPOLAMINE 1 MG/3DAYS TD PT72
MEDICATED_PATCH | TRANSDERMAL | Status: AC
  Filled 2013-10-27: qty 1

## 2013-10-27 MED ORDER — FENTANYL CITRATE 0.05 MG/ML IJ SOLN
INTRAMUSCULAR | Status: DC | PRN
  Administered 2013-10-27: 50 ug via INTRAVENOUS
  Administered 2013-10-27: 100 ug via INTRAVENOUS

## 2013-10-27 MED ORDER — DEXAMETHASONE SODIUM PHOSPHATE 4 MG/ML IJ SOLN
INTRAMUSCULAR | Status: DC | PRN
  Administered 2013-10-27: 10 mg via INTRAVENOUS

## 2013-10-27 MED ORDER — OXYCODONE HCL 5 MG PO TABS: 5.0000 mg | ORAL_TABLET | ORAL | Status: DC | PRN

## 2013-10-27 MED ORDER — CEFAZOLIN SODIUM-DEXTROSE 2-3 GM-% IV SOLR
INTRAVENOUS | Status: AC
  Filled 2013-10-27: qty 50

## 2013-10-27 MED ORDER — FENTANYL CITRATE 0.05 MG/ML IJ SOLN
INTRAMUSCULAR | Status: AC
  Filled 2013-10-27: qty 2

## 2013-10-27 MED ORDER — SODIUM CHLORIDE 0.9 % IJ SOLN: 3.0000 mL | Freq: Two times a day (BID) | INTRAMUSCULAR | Status: DC

## 2013-10-27 MED ORDER — SODIUM CHLORIDE 0.9 % IV SOLN: 250.0000 mL | INTRAVENOUS | Status: DC | PRN

## 2013-10-27 MED ORDER — ONDANSETRON HCL 4 MG/2ML IJ SOLN
INTRAMUSCULAR | Status: DC | PRN
  Administered 2013-10-27: 4 mg via INTRAVENOUS

## 2013-10-27 SURGICAL SUPPLY — 48 items
BINDER BREAST MEDIUM (GAUZE/BANDAGES/DRESSINGS) ×3 IMPLANT
BLADE HEX COATED 2.75 (ELECTRODE) ×3 IMPLANT
BLADE SURG 10 STRL SS (BLADE) ×3 IMPLANT
BLADE SURG 15 STRL LF DISP TIS (BLADE) ×1 IMPLANT
BLADE SURG 15 STRL SS (BLADE) ×2
CANISTER SUCT 1200ML W/VALVE (MISCELLANEOUS) IMPLANT
CHLORAPREP W/TINT 26ML (MISCELLANEOUS) ×3 IMPLANT
COVER MAYO STAND STRL (DRAPES) IMPLANT
COVER TABLE BACK 60X90 (DRAPES) IMPLANT
DECANTER SPIKE VIAL GLASS SM (MISCELLANEOUS) IMPLANT
DERMABOND ADVANCED (GAUZE/BANDAGES/DRESSINGS) ×2
DERMABOND ADVANCED .7 DNX12 (GAUZE/BANDAGES/DRESSINGS) ×1 IMPLANT
DRAPE PED LAPAROTOMY (DRAPES) IMPLANT
DRAPE UTILITY XL STRL (DRAPES) ×3 IMPLANT
ELECT REM PT RETURN 9FT ADLT (ELECTROSURGICAL) ×3
ELECTRODE REM PT RTRN 9FT ADLT (ELECTROSURGICAL) ×1 IMPLANT
GLOVE BIO SURGEON STRL SZ 6 (GLOVE) IMPLANT
GLOVE BIOGEL PI IND STRL 6.5 (GLOVE) ×1 IMPLANT
GLOVE BIOGEL PI INDICATOR 6.5 (GLOVE) ×2
GLOVE EXAM NITRILE MD LF STRL (GLOVE) ×3 IMPLANT
GLOVE SURG SS PI 6.0 STRL IVOR (GLOVE) ×3 IMPLANT
GLOVE SURG SS PI 6.5 STRL IVOR (GLOVE) ×3 IMPLANT
GOWN STRL REUS W/ TWL LRG LVL3 (GOWN DISPOSABLE) ×1 IMPLANT
GOWN STRL REUS W/TWL 2XL LVL3 (GOWN DISPOSABLE) ×3 IMPLANT
GOWN STRL REUS W/TWL LRG LVL3 (GOWN DISPOSABLE) ×2
NEEDLE HYPO 25X1 1.5 SAFETY (NEEDLE) ×3 IMPLANT
NS IRRIG 1000ML POUR BTL (IV SOLUTION) IMPLANT
PACK BASIN DAY SURGERY FS (CUSTOM PROCEDURE TRAY) ×3 IMPLANT
PACK UNIVERSAL I (CUSTOM PROCEDURE TRAY) IMPLANT
PENCIL BUTTON HOLSTER BLD 10FT (ELECTRODE) ×3 IMPLANT
SLEEVE SCD COMPRESS KNEE MED (MISCELLANEOUS) ×6 IMPLANT
SPONGE GAUZE 4X4 12PLY STER LF (GAUZE/BANDAGES/DRESSINGS) ×3 IMPLANT
SPONGE LAP 18X18 X RAY DECT (DISPOSABLE) ×3 IMPLANT
STAPLER VISISTAT 35W (STAPLE) IMPLANT
SUT MNCRL AB 4-0 PS2 18 (SUTURE) ×3 IMPLANT
SUT SILK 3 0 TIES 17X18 (SUTURE) ×2
SUT SILK 3-0 18XBRD TIE BLK (SUTURE) ×1 IMPLANT
SUT VIC AB 2-0 SH 27 (SUTURE)
SUT VIC AB 2-0 SH 27XBRD (SUTURE) IMPLANT
SUT VIC AB 3-0 SH 27 (SUTURE) ×2
SUT VIC AB 3-0 SH 27X BRD (SUTURE) ×1 IMPLANT
SUT VICRYL 4-0 PS2 18IN ABS (SUTURE) ×3 IMPLANT
SYR CONTROL 10ML LL (SYRINGE) ×3 IMPLANT
TOWEL OR 17X24 6PK STRL BLUE (TOWEL DISPOSABLE) ×3 IMPLANT
TOWEL OR NON WOVEN STRL DISP B (DISPOSABLE) ×3 IMPLANT
TUBE CONNECTING 20'X1/4 (TUBING)
TUBE CONNECTING 20X1/4 (TUBING) IMPLANT
YANKAUER SUCT BULB TIP NO VENT (SUCTIONS) ×3 IMPLANT

## 2013-10-27 NOTE — Discharge Instructions (Addendum)
[facility] °Office Phone Number [phone] ° °BREAST BIOPSY/ PARTIAL MASTECTOMY: POST OP INSTRUCTIONS ° °Always review your discharge instruction sheet given to you by the facility where your surgery was performed. ° °IF YOU HAVE DISABILITY OR FAMILY LEAVE FORMS, YOU MUST BRING THEM TO THE OFFICE FOR PROCESSING.  DO NOT GIVE THEM TO YOUR DOCTOR. ° °1. A prescription for pain medication may be given to you upon discharge.  Take your pain medication as prescribed, if needed.  If narcotic pain medicine is not needed, then you may take acetaminophen (Tylenol) or ibuprofen (Advil) as needed. °2. Take your usually prescribed medications unless otherwise directed °3. If you need a refill on your pain medication, please contact your pharmacy.  They will contact our office to request authorization.  Prescriptions will not be filled after 5pm or on week-ends. °4. You should eat very light the first 24 hours after surgery, such as soup, crackers, pudding, etc.  Resume your normal diet the day after surgery. °5. Most patients will experience some swelling and bruising in the breast.  Ice packs and a good support bra will help.  Swelling and bruising can take several days to resolve.  °6. It is common to experience some constipation if taking pain medication after surgery.  Increasing fluid intake and taking a stool softener will usually help or prevent this problem from occurring.  A mild laxative (Milk of Magnesia or Miralax) should be taken according to package directions if there are no bowel movements after 48 hours. °7. Unless discharge instructions indicate otherwise, you may remove your bandages 48 hours after surgery, and you may shower at that time.  You may have steri-strips (small skin tapes) in place directly over the incision.  These strips should be left on the skin for 7-10 days.   Any sutures or staples will be removed at the office during your follow-up visit. °8. ACTIVITIES:  You may resume  regular daily activities (gradually increasing) beginning the next day.  Wearing a good support bra or sports bra (or the breast binder) minimizes pain and swelling.  You may have sexual intercourse when it is comfortable. °a. You may drive when you no longer are taking prescription pain medication, you can comfortably wear a seatbelt, and you can safely maneuver your car and apply brakes. °b. RETURN TO WORK:  __________1 week_______________ °9. You should see your doctor in the office for a follow-up appointment approximately two weeks after your surgery.  Your doctor’s nurse will typically make your follow-up appointment when she calls you with your pathology report.  Expect your pathology report 2-3 business days after your surgery.  You may call to check if you do not hear from us after three days. ° ° °WHEN TO CALL YOUR DOCTOR: °1. Fever over 101.0 °2. Nausea and/or vomiting. °3. Extreme swelling or bruising. °4. Continued bleeding from incision. °5. Increased pain, redness, or drainage from the incision. ° °The clinic staff is available to answer your questions during regular business hours.  Please don’t hesitate to call and ask to speak to one of the nurses for clinical concerns.  If you have a medical emergency, go to the nearest emergency room or call 911.  A surgeon from [facility] is always on call at the hospital. ° °For further questions, please visit [email or web address]  ° ° °Post Anesthesia Home Care Instructions ° °Activity: °Get plenty of rest for the remainder of the day. A responsible adult should stay with you for 24   hours following the procedure.  °For the next 24 hours, DO NOT: °-Drive a car °-Operate machinery °-Drink alcoholic beverages °-Take any medication unless instructed by your physician °-Make any legal decisions or sign important papers. ° °Meals: °Start with liquid foods such as gelatin or soup. Progress to regular foods as tolerated. Avoid greasy, spicy, heavy  foods. If nausea and/or vomiting occur, drink only clear liquids until the nausea and/or vomiting subsides. Call your physician if vomiting continues. ° °Special Instructions/Symptoms: °Your throat may feel dry or sore from the anesthesia or the breathing tube placed in your throat during surgery. If this causes discomfort, gargle with warm salt water. The discomfort should disappear within 24 hours. ° ° °

## 2013-10-27 NOTE — Anesthesia Preprocedure Evaluation (Signed)
Anesthesia Evaluation  Patient identified by MRN, date of birth, ID band Patient awake    Reviewed: Allergy & Precautions, H&P , NPO status , Patient's Chart, lab work & pertinent test results  History of Anesthesia Complications (+) PONV  Airway Mallampati: I TM Distance: >3 FB Neck ROM: Full    Dental  (+) Teeth Intact, Dental Advisory Given   Pulmonary  breath sounds clear to auscultation        Cardiovascular Rhythm:Regular Rate:Normal     Neuro/Psych    GI/Hepatic GERD-  Medicated and Controlled,  Endo/Other    Renal/GU      Musculoskeletal   Abdominal   Peds  Hematology   Anesthesia Other Findings   Reproductive/Obstetrics                           Anesthesia Physical Anesthesia Plan  ASA: II  Anesthesia Plan: General   Post-op Pain Management:    Induction: Intravenous  Airway Management Planned: LMA  Additional Equipment:   Intra-op Plan:   Post-operative Plan: Extubation in OR  Informed Consent: I have reviewed the patients History and Physical, chart, labs and discussed the procedure including the risks, benefits and alternatives for the proposed anesthesia with the patient or authorized representative who has indicated his/her understanding and acceptance.   Dental advisory given  Plan Discussed with: CRNA, Anesthesiologist and Surgeon  Anesthesia Plan Comments:         Anesthesia Quick Evaluation

## 2013-10-27 NOTE — Interval H&P Note (Signed)
History and Physical Interval Note:  10/27/2013 1:13 PM  Monica Phelps  has presented today for surgery, with the diagnosis of LEFT BREAST MASS  The various methods of treatment have been discussed with the patient and family. After consideration of risks, benefits and other options for treatment, the patient has consented to  Procedure(s): EXCISION  LEFT BREAST MASS (Left) as a surgical intervention .  The patient's history has been reviewed, patient examined, no change in status, stable for surgery.  I have reviewed the patient's chart and labs.  Questions were answered to the patient's satisfaction.     Faigy Stretch

## 2013-10-27 NOTE — H&P (View-Only) (Signed)
Patient ID: Monica Phelps, female   DOB: 07-22-1985, 28 y.o.   MRN: [id number]  Chief complaint:  Left breast mass.   HPI Monica Phelps is a 28 y.o. female.   HPI  [name] is a 28 year old female who presents with a 6 month history of a left breast mass. She stated that when it was first discovered it was quite sore, but the soreness has resolved. She does feel like it is getting larger. She thinks it was around a centimeter when she first noted it and now it is around 3 cm. She has extended family members with a history of breast cancer but no first degree relatives. 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 She states that she had some abnormal uterine bleeding around the time that this was discovered. She was placed on progesterone to stop the bleeding and it did seem to make the mass get bigger at that time. Since she stopped the progesterone, the mass has stabilized. She no longer has any discomfort.  Past Medical History  Diagnosis Date  . Reflux   . UTI (lower urinary tract infection)   . Constipation   . Gastritis   . Fibroid   . History of bleeding disorder as a child   . Diabetes mellitus without complication   . Asthma   . Headache(784.0)     migraines  . GERD (gastroesophageal reflux disease)   . Anxiety     paniac  . Blood dyscrasia     pt states hemorrhaged with last period 12/14 and was hospitalized in [place]  . Endometriosis     Past Surgical History  Procedure Laterality Date  . No past surgeries    . Dilation and curettage of uterus    . Induced abortion      at 5 months fetal anomalies hemorrhaged afterwards  . Dilation and curettage of uterus N/A 03/03/2013    Procedure: DILATATION AND CURETTAGE;  Surgeon: Emily Filbert, MD;  Location: [facility];  Service: Gynecology;  Laterality: N/A;  . Laparoscopy N/A 03/03/2013    Procedure: LAPAROSCOPY DIAGNOSTIC;  Surgeon: Emily Filbert, MD;  Location: [facility];   Service: Gynecology;  Laterality: N/A;  . Laparoscopic lysis of adhesions N/A 03/03/2013    Procedure: LAPAROSCOPIC LYSIS OF ADHESIONS;  Surgeon: Emily Filbert, MD;  Location: [facility];  Service: Gynecology;  Laterality: N/A;    Family History  Problem Relation Age of Onset  . Alcohol abuse Neg Hx   . Arthritis Neg Hx   . Asthma Neg Hx   . Birth defects Neg Hx   . Cancer Neg Hx   . Depression Neg Hx   . COPD Neg Hx   . Diabetes Neg Hx   . Drug abuse Neg Hx   . Early death Neg Hx   . Hearing loss Neg Hx   . Kidney disease Neg Hx   . Learning disabilities Neg Hx   . Mental illness Neg Hx   . Mental retardation Neg Hx   . Miscarriages / Stillbirths Neg Hx   . Vision loss Neg Hx   . Hypertension Mother   . Heart disease Mother   . Stroke Mother   . Hyperlipidemia Father     Social History History  Substance Use Topics  . Smoking status: Never Smoker   . Smokeless tobacco: Never Used  . Alcohol Use: No    Allergies  Allergen Reactions  . Dilaudid [Hydromorphone Hcl] Anaphylaxis and Nausea And Vomiting  . Morphine And Related Shortness Of Breath and Nausea And Vomiting  . Latex Itching    Current Outpatient Prescriptions  Medication Sig Dispense Refill  . folic acid (FOLVITE) 585 MCG tablet Take 800 mcg by mouth daily.      . megestrol (MEGACE) 40 MG tablet Take 1 tablet (40 mg total) by mouth 2 (two) times daily.  20 tablet  1   Current Facility-Administered Medications  Medication Dose Route Frequency Provider Last Rate Last Dose  . levonorgestrel (MIRENA) 20 MCG/24HR IUD   Intrauterine Once Terrance Mass, MD        Review of Systems Review of Systems  Cardiovascular: Positive for chest pain.  Genitourinary: Positive for vaginal bleeding.  All other systems reviewed and are negative.   Blood pressure 110/70, pulse 71, temperature 97.2 F (36.2 C), temperature source Temporal, resp. rate 16, height 5\' 3"  (1.6 m), weight 148 lb 3.2 oz (67.223 kg).  Physical  Exam Physical Exam  Constitutional: She is oriented to person, place, and time. She appears well-developed and well-nourished. No distress.  HENT:  Head: Normocephalic and atraumatic.  Eyes: Conjunctivae are normal. Pupils are equal, round, and reactive to light. Right eye exhibits no discharge. Left eye exhibits no discharge. No scleral icterus.  Neck: Normal range of motion. Neck supple. No tracheal deviation present. No thyromegaly present.  Cardiovascular: Normal rate, regular rhythm, normal heart sounds and intact distal pulses.   No murmur heard. Pulmonary/Chest: Effort normal and breath sounds normal. No respiratory distress. She exhibits no tenderness. Right breast exhibits no inverted nipple, no mass, no nipple discharge, no skin change and no tenderness. Left breast exhibits mass. Left breast exhibits no inverted nipple, no nipple discharge, no skin change and no tenderness. Breasts are symmetrical.    2.5 cm mass at 1 o'clock Relatively dense tissue bilaterally  Abdominal: Soft. Bowel sounds are normal. She exhibits no distension. There is no tenderness.  Musculoskeletal: Normal range of motion. She exhibits no edema and no tenderness.  Lymphadenopathy:    She has no cervical adenopathy.  Neurological: She is alert and oriented to person, place, and time. Coordination normal.  Skin: Skin is warm and dry. No rash noted. She is not diaphoretic. No erythema. No pallor.  Psychiatric: She has a normal mood and affect. Her behavior is normal. Judgment and thought content normal.    Data Reviewed Ultrasound 08/26/2013 [facility] 2.94x1.74x3.13 cm mass probably fibroadenoma  Assessment/Plan    Left breast mass, probable fibroadenoma This mass is most likely a fibroadenoma based on age, natural history, and imaging. However, since it is enlarging we will plan to remove it. I reviewed the surgery with the patient. I discussed that we would make a circumareolar incision in the  upper outer quadrant of the left breast.  I advised her that this is an outpatient procedure. I discussed that I recommend no heavy lifting or strenuous activity for a week. She discussed having a "bleeding disorder", but on further discussion, it appeared that this was related to vaginal bleeding that resolved with progesterone. This is not appear to reflect an actual bleeding diathesis.  The surgical procedure was described to the patient.  I discussed the incision type and location.  The risks and benefits of the procedure were described to the patient and she wishes to proceed.    We discussed the risks bleeding, infection, damage to other structures, need for  further procedures/surgeries.  We discussed the risk of seroma.  The patient was advised if the area in the breast in cancer, we may need to go back to surgery for additional tissue to obtain negative margins or for a lymph node biopsy. The patient was advised that these are the most common complications, but that others can occur as well.  They were advised against taking aspirin or other anti-inflammatory agents/blood thinners the week before surgery.  We discussed post op pain control.         Bernon Arviso 10/04/2013, 10:35 AM

## 2013-10-27 NOTE — Anesthesia Postprocedure Evaluation (Signed)
  Anesthesia Post-op Note  Patient: Monica Phelps  Procedure(s) Performed: Procedure(s): EXCISION  LEFT BREAST MASS (Left)  Patient Location: PACU  Anesthesia Type: General   Level of Consciousness: awake, alert  and oriented  Airway and Oxygen Therapy: Patient Spontanous Breathing  Post-op Pain: mild  Post-op Assessment: Post-op Vital signs reviewed  Post-op Vital Signs: Reviewed  Last Vitals:  Filed Vitals:   10/27/13 1445  BP: 129/72  Pulse: 70  Temp:   Resp: 19    Complications: No apparent anesthesia complications

## 2013-10-27 NOTE — Transfer of Care (Signed)
Immediate Anesthesia Transfer of Care Note  Patient: Monica Phelps  Procedure(s) Performed: Procedure(s): EXCISION  LEFT BREAST MASS (Left)  Patient Location: PACU  Anesthesia Type:General  Level of Consciousness: awake, alert  and oriented  Airway & Oxygen Therapy: Patient Spontanous Breathing and Patient connected to nasal cannula oxygen  Post-op Assessment: Report given to PACU RN and Post -op Vital signs reviewed and stable  Post vital signs: Reviewed and stable  Complications: No apparent anesthesia complications

## 2013-10-27 NOTE — Op Note (Signed)
Excisional Breast Biopsy  Indications: This patient presents with history of left breast mass, clinically c/w fibroadenoma, enlarging, symptomatic.  Pre-operative Diagnosis: left breast mass  Post-operative Diagnosis: left breast mass  Surgeon: Stark Klein   Anesthesia: General LMA anesthesia and Local anesthesia 1% plain lidocaine, 0.25.% bupivacaine  ASA Class: 2  Procedure Details  The patient was seen in the Holding Room. The risks, benefits, complications, treatment options, and expected outcomes were discussed with the patient. The possibilities of reaction to medication, pulmonary aspiration, bleeding, infection, the need for additional procedures, failure to diagnose a condition, and creating a complication requiring transfusion or operation were discussed with the patient. The patient concurred with the proposed plan, giving informed consent.  The site of surgery properly noted/marked. The patient was taken to Operating Room # 5, identified, and the procedure verified as Left Breast Excisional Biopsy. A Time Out was held and the above information confirmed.  After induction of anesthesia, the left  breast and chest were prepped and draped in standard fashion. The lumpectomy was performed by creating an oblique incision over the upper outer quadrant of the breast.  Dissection was carried down around the mass.  Orientation sutures were placed on the specimen.  Hemostasis was achieved with cautery.  The wound was irrigated and closed with a 2-0 vicryl deep suture, a  3-0 Vicryl interrupted deep dermal stitch and a 4-0 Monocryl subcuticular closure in layers.    Sterile dressings were applied. At the end of the operation, all sponge, instrument, and needle counts were correct.  Findings: grossly clear surgical margins, 3 cm mass c/w fibroadenoma.  Estimated Blood Loss:  Minimal         Specimens: left breast mass         Complications:  None; patient tolerated the procedure well.        Disposition: PACU - hemodynamically stable.         Condition: stable

## 2013-10-28 ENCOUNTER — Encounter (HOSPITAL_BASED_OUTPATIENT_CLINIC_OR_DEPARTMENT_OTHER): Payer: Self-pay | Admitting: General Surgery

## 2013-10-28 NOTE — Addendum Note (Signed)
Addended by: Marlis Edelson C on: 10/28/2013 01:26 PM   Modules accepted: Orders

## 2013-10-31 NOTE — Progress Notes (Signed)
Quick Note:  Please let patient know that pathology is benign. ______ 

## 2013-11-01 ENCOUNTER — Telehealth (INDEPENDENT_AMBULATORY_CARE_PROVIDER_SITE_OTHER): Payer: Self-pay

## 2013-11-01 NOTE — Telephone Encounter (Signed)
Pt's husband given benign pathology results.

## 2013-11-02 ENCOUNTER — Telehealth: Payer: Self-pay | Admitting: *Deleted

## 2013-11-02 NOTE — Telephone Encounter (Signed)
Notified pt and husband (ec) who translated for her, of pts normal holter monitor results per Dr Meda Coffee.  Both parties verbalized understanding and pleased with these results.

## 2013-11-17 ENCOUNTER — Ambulatory Visit: Payer: BC Managed Care – PPO | Admitting: Cardiology

## 2013-12-13 ENCOUNTER — Encounter (HOSPITAL_BASED_OUTPATIENT_CLINIC_OR_DEPARTMENT_OTHER): Payer: Self-pay | Admitting: General Surgery

## 2014-01-21 ENCOUNTER — Ambulatory Visit (INDEPENDENT_AMBULATORY_CARE_PROVIDER_SITE_OTHER): Payer: BC Managed Care – PPO | Admitting: Women's Health

## 2014-01-21 ENCOUNTER — Encounter: Payer: Self-pay | Admitting: Women's Health

## 2014-01-21 VITALS — Wt 140.0 lb

## 2014-01-21 DIAGNOSIS — N898 Other specified noninflammatory disorders of vagina: Secondary | ICD-10-CM

## 2014-01-21 LAB — WET PREP FOR TRICH, YEAST, CLUE
Clue Cells Wet Prep HPF POC: NONE SEEN
Trich, Wet Prep: NONE SEEN
Yeast Wet Prep HPF POC: NONE SEEN

## 2014-01-21 NOTE — Progress Notes (Signed)
Patient ID: AOI KOUNS, female   DOB: [birth year], 28 y.o.   MRN: [id number] Presents with numerous complaints of vague type pain abdomen, back, pelvic, upper legs. Denies urinary symptoms, fever, nausea.  History of endometriosis, second trimester miscarriage 12/2012 and surgical removal left breast fibroadenoma. Contraception/ rhythm.. Negative sonohysterogram 09/2013. Regular monthly cycle since sonohysterogram. Irregular bleeding January through August. Chronic constipation.  Exam: Appears well. No CVAT, abdomen soft no rebound or radiation of pain with deep palpation. Pain localized to lower pelvis. External genitalia within normal limits, speculum exam scant white discharge wet prep negative. Bimanual no CMT or adnexal fullness or tenderness, exam negative.  Chronic constipation  Plan: Ovulation predictors, conceptual timing reviewed. Return to office with missed cycle for viability ultrasound. MiraLAX daily until regular soft bowel movements, then decrease frequency of MiraLAX, increase fiber rich foods to greater than 20 g daily, increase fluids. Call if continued discomfort. Prenatal vitamin daily encouraged. Safe pregnancy behaviors reviewed.

## 2014-03-03 ENCOUNTER — Ambulatory Visit (INDEPENDENT_AMBULATORY_CARE_PROVIDER_SITE_OTHER): Payer: BLUE CROSS/BLUE SHIELD | Admitting: Gynecology

## 2014-03-03 ENCOUNTER — Encounter: Payer: Self-pay | Admitting: Gynecology

## 2014-03-03 VITALS — BP 124/78

## 2014-03-03 DIAGNOSIS — N644 Mastodynia: Secondary | ICD-10-CM

## 2014-03-03 DIAGNOSIS — N912 Amenorrhea, unspecified: Secondary | ICD-10-CM

## 2014-03-03 DIAGNOSIS — Z833 Family history of diabetes mellitus: Secondary | ICD-10-CM | POA: Insufficient documentation

## 2014-03-03 DIAGNOSIS — R232 Flushing: Secondary | ICD-10-CM

## 2014-03-03 DIAGNOSIS — R634 Abnormal weight loss: Secondary | ICD-10-CM

## 2014-03-03 DIAGNOSIS — B359 Dermatophytosis, unspecified: Secondary | ICD-10-CM | POA: Insufficient documentation

## 2014-03-03 LAB — PREGNANCY, URINE: Preg Test, Ur: NEGATIVE

## 2014-03-03 LAB — HEMOGLOBIN A1C
HEMOGLOBIN A1C: 5.4 % (ref <5.7)
MEAN PLASMA GLUCOSE: 108 mg/dL (ref <117)

## 2014-03-03 LAB — TSH: TSH: 1.587 u[IU]/mL (ref 0.350–4.500)

## 2014-03-03 MED ORDER — FLUCONAZOLE 150 MG PO TABS: ORAL_TABLET | ORAL | Status: DC

## 2014-03-03 MED ORDER — MEDROXYPROGESTERONE ACETATE 10 MG PO TABS: 10.0000 mg | ORAL_TABLET | Freq: Every day | ORAL | Status: DC

## 2014-03-03 MED ORDER — CLOBETASOL PROPIONATE 0.05 % EX CREA: 1.0000 "application " | TOPICAL_CREAM | Freq: Two times a day (BID) | CUTANEOUS | Status: DC

## 2014-03-03 NOTE — Progress Notes (Signed)
   Patient is a 29 year old gravida 1 para 0 AB 1 who presented to the office today with several complaints.  Complaint #1 patient late on her menses for 3 days has been using rhythm for contraception #2 patient complaining of some breast point sensations at times since her excision of a left breast fibroadenoma in September 2015 #3 medial thigh irritated area #4 vasomotor symptoms  Exam: Both breasts were examined sitting supine position there were symmetrical in appearance no skin discoloration no palpable masses or tenderness no supraclavicular axillary lymphadenopathy. Scar from excision of left breast fibroadenoma noted on the left breast.  Right medial thigh hyperemic area highly suspicious for tenia  Pelvic exam: Bartholin urethra Skene glands within normal limits Vagina: No lesions or discharge Cervix: No lesions or discharge Uterus anteverted normal size shape and consistency Adnexa: No palpable masses or tenderness  Urine pregnancy test negative  Assessment/plan: #1 secondary amenorrhea patient exercising and has lost weight may be contributing factor to anovulation and no menses. Your friend see test negative. We will check a TSH and prolactin today. And the results are negative as well as a serum qualitative beta-hCG then she will be prescribed Provera 10 mg to take 1 by mouth daily for 10 days to jump start her menses. #2 because of her vasomotor symptoms now that we check her TSH but a hemoglobin A1c because of her family history of diabetes #3 for her right medial thigh tenia she'll be prescribed aftercare 150 mg one by mouth every other day for 3 days and apply topical clobetasol daily at bedtime for one week then 3 times a week for the following week. She had tried Monistat over-the-counter for 3 days with no avail. If no resolution she will contact the office and we will refer them to the dermatologist. #4 patient wishes to use the rhythm method for contraception. She was  advised to take a prenatal vitamin daily. #5 patient scheduled for her annual exam the next couple months.

## 2014-03-04 LAB — PROLACTIN: Prolactin: 25.4 ng/mL

## 2014-03-04 LAB — HCG, SERUM, QUALITATIVE: PREG SERUM: NEGATIVE

## 2014-05-24 ENCOUNTER — Ambulatory Visit: Payer: BLUE CROSS/BLUE SHIELD | Admitting: Gynecology

## 2014-05-24 ENCOUNTER — Encounter: Payer: Self-pay | Admitting: Gynecology

## 2014-05-24 ENCOUNTER — Ambulatory Visit (INDEPENDENT_AMBULATORY_CARE_PROVIDER_SITE_OTHER): Payer: BLUE CROSS/BLUE SHIELD | Admitting: Gynecology

## 2014-05-24 VITALS — BP 124/80

## 2014-05-24 DIAGNOSIS — Z8742 Personal history of other diseases of the female genital tract: Secondary | ICD-10-CM | POA: Diagnosis not present

## 2014-05-24 DIAGNOSIS — IMO0002 Reserved for concepts with insufficient information to code with codable children: Secondary | ICD-10-CM

## 2014-05-24 DIAGNOSIS — N941 Dyspareunia: Secondary | ICD-10-CM | POA: Diagnosis not present

## 2014-05-24 DIAGNOSIS — F419 Anxiety disorder, unspecified: Secondary | ICD-10-CM | POA: Diagnosis not present

## 2014-05-24 DIAGNOSIS — N930 Postcoital and contact bleeding: Secondary | ICD-10-CM

## 2014-05-24 MED ORDER — SCOPOLAMINE 1 MG/3DAYS TD PT72
1.0000 | MEDICATED_PATCH | TRANSDERMAL | Status: DC
Start: 2014-05-24 — End: 2014-09-12

## 2014-05-24 MED ORDER — ALPRAZOLAM 0.25 MG PO TABS: 0.2500 mg | ORAL_TABLET | Freq: Every evening | ORAL | Status: DC | PRN

## 2014-05-24 NOTE — Progress Notes (Signed)
   Patient presented to the office today complaining of painful orgasm and occasional postcoital bleeding. She is a 29 year old gravida 1 para 0 AB 1 who has had long-standing history of menorrhagia and menstrual cramps. Patient stated she was a virgin until she got married and had been on oral contraceptive pill and continued to have irregular bleeding. Had offered her at another practice cryotherapy of her cervix in the event that she had a chronic cervicitis. In early 2015 patient had a second trimester miscarriage at [redacted] weeks gestation. She had been at bedrest since 16 weeks secondary to preterm premature ruptured membranes. She continued to bleed was taken back to the operating room for a D&C was performed believing there was probably retained products of conception and she has continued to bleed. She had been placed on antibiotic for suspected chorioamnionitis leading to her preterm premature rupture membranes. She stated also says she was tested and was positive for group B strep. They had tested her for von Willebrand along with CBC and PT and PTT as well as Leiden V Factor, as well as antiphospholipid antibodies IgG and IgA  all studies were normal. She also had a normal TSH. In 2015 she had a laparoscopy with a report had indicated she had endometriosis of the left uterosacral ligament which had been cauterized at the time. That was a reason she had been placed on Depo-Provera. She is currently on no hormones and is using the rhythm method for contraception.  Patient is informing that she is getting rate to go on a vacation for 2 weeks out of the country and wanted a scopolamine patch because of potential seasickness which she's on a ship. Also she needed some Xanax because of anxiety and apprehension when she travels.  Patient had a normal Pap smear in 2015. She also had Ultrasound/sonohysterogram: Uterus measures 6.7 x 5.5 x 3.9 cm with an endometrial stripe of 4.4 mm. Both right and left ovaries  were normal. Sterile saline was instilled into the uterine cavity and no intrauterine defects were noted.  Exam: Blood pressure 124/80 Gen. appearance well-developed well-nourished female in no acute distress Abdomen: Soft nontender slightly over the suprapubic area but no rebound or guarding Pelvic: Bartholin urethra Skene was within normal limits Vagina: No lesions or discharge Cervix: No gross lesions on inspection Bimanual examination patient was tender in the uterosacral region Adnexa: No palpable masses or tenderness Rectal exam not  Assessment/plan: Patient with dyspareunia as well as painful orgasm with known history of endometriosis currently on no treatment. Patient using the rhythm method for contraception. We discussed the following options: #1 continuous oral contraceptive pill #2 Depo-Provera injection every 3 months #3 Lupron injection every 3 months 6 months with longer treatment with add back therapy #4 Mirena IUD #5 reoperate laparoscopically #6 anxiety  Patient would like to proceed with option #4 when she returns back from her vacation. For anxiety she will be prescribed Xanax 0.25 mg take 1 by mouth daily when necessary. And scopolamine patch prescribed to apply behind her ear every 3 days when necessary on her trip. She fully wears a she cannot take both of these medications if she were to get pregnant. Instructions provided in Spanish.

## 2014-05-24 NOTE — Patient Instructions (Signed)
Alprazolam tablets Qu es este medicamento? El ALPRAZOLAM es una benzodiacepina. Se utiliza para tratar la ansiedad y los ataques de pnico. [place] medicamento puede ser utilizado para otros usos; si tiene alguna pregunta consulte con su proveedor de atencin mdica o con su farmacutico. MARCAS COMERCIALES DISPONIBLES: Xanax Qu le debo informar a mi profesional de la salud antes de tomar este medicamento? Necesita saber si usted presenta alguno de los siguientes problemas o situaciones: -problema de alcoholismo o drogadiccin -trastorno bipolar, depresin, psicosis u otros problemas de salud mental -glaucoma -enfermedad renal o heptica -enfermedad pulmonar o respiratoria -miastenia gravis -enfermedad de Parkinson -porfiria -convulsiones o antecedentes de convulsiones -ideas suicidas -una reaccin alrgica o inusual al alprazolam, a otras benzodiacepinas, alimentos, colorantes o conservantes -si est embarazada o buscando quedar embarazada -si est amamantando a un beb Cmo debo utilizar este medicamento? Tome este medicamento por va oral con un vaso de agua. Siga las instrucciones de la etiqueta del [place]. Tome sus dosis a intervalos regulares. No tome su medicamento con una frecuencia mayor a la indicada. Si ha venido tomando Coca[employer]Cola de [place] regular durante algn tiempo, no deje de tomarlo repentinamente. Debe reducir gradualmente la dosis para no sufrir efectos secundarios severos. Consulte a su mdico o a su profesional de la salud por asesoramiento. Aun despus de dejar de tomarlo, los efectos del medicamento en su cuerpo pueden perdurar [employer]. Hable con su pediatra para informarse acerca del uso de este medicamento en nios. Puede requerir atencin especial. Sobredosis: Pngase en contacto inmediatamente con un centro toxicolgico o una sala de urgencia si usted cree que haya tomado demasiado medicamento. ATENCIN: [employer] es solo para usted.  No comparta este medicamento con nadie. Qu sucede si me olvido de una dosis? Si olvida una dosis, tmela lo antes posible. Si es casi la hora de la prxima dosis, tome slo esa dosis. No tome dosis adicionales o dobles. Qu puede interactuar con este medicamento? No tome esta medicina junto con ninguno de los siguientes medicamentos: -algunos medicamentos para la infeccin por VIH o SIDA -quetoconazol -itraconazol Esta medicina tambin puede interactuar con los siguientes medicamentos: -pldoras anticonceptivas -algunos antibiticos macrlidos, tales como claritromicina, eritromicina o troleandomicina -cimetidina -ciclosporina -ergotamina -jugo de toronja -suplementos dietticos o a base de hierbas, como kava kava, melatonina, dehidroepiandrosterona, DHEA, hierba de [place] Depriest o valeriana -imatinib, STI-571 -isoniazida -levodopa -medicamentos para la depresin, ansiedad o trastornos psicticos -analgsicos recetados -rifampicina, rifapentina o rifabutina -algunos medicamentos para la presin sangunea o problemas cardiacos -algunos medicamentos para las convulsiones, tales como [place], Ali Molina, [place], Museum/gallery curator o primidona Puede ser que esta lista no menciona todas las posibles interacciones. Informe a su profesional de KB Home	[place] de [employer] Corporation productos a base de hierbas, medicamentos de [place] o suplementos nutritivos que est tomando. Si usted fuma, consume bebidas alcohlicas o si utiliza drogas ilegales, indqueselo tambin a su profesional de KB Home	[place]. Algunas sustancias pueden interactuar con su medicamento. A qu debo estar atento al usar Coca-Cola? Visite a su mdico o a su profesional de la salud para chequear su evolucin peridicamente. Su cuerpo puede hacerse dependiente del medicamento. Por esta razn, pregunte a su mdico o a su profesional de la salud si todava necesita tomarlo. Puede experimentar somnolencia o mareos. No conduzca ni utilice  maquinaria, ni haga nada que Associate Professor en estado de alerta hasta que sepa cmo le afecta este medicamento. Para reducir el riesgo de mareos o [place], no se ponga de pie ni se siente  con rapidez, especialmente si es un paciente de [place]. El alcohol puede aumentar su somnolencia y [place]. Evite consumir bebidas alcohlicas. No se trate usted mismo si tiene tos, resfro o Set designer sin Teacher, adult education a su mdico o a su profesional de Technical sales engineer. Algunos ingredientes pueden aumentar los posibles efectos secundarios. Qu efectos secundarios puedo tener al [employer] este medicamento? Efectos secundarios que debe informar a su mdico o a Barrister's clerk de la salud tan pronto como sea posible: -Chief of Staff como erupcin cutnea, picazn o urticarias, hinchazn de la cara, labios o lengua -confusin, tendencia a olvidar -depresin -dificultad para conciliar el sueo -dificultad para hablar -sensacin de desmayos o mareos, cadas -cambios de humor, excitabilidad o comportamiento agresivo -calambres musculares -dificultad para orinar o cambios en el volumen de orina -cansancio o debilidad inusual Efectos secundarios que, por lo general, no requieren atencin mdica (debe informarlos a su mdico o a su profesional de la salud si persisten o si son molestos): -cambios en el deseo sexual o capacidad -cambios del apetito Puede ser que esta lista no menciona todos los posibles efectos secundarios. Comunquese a su mdico por asesoramiento mdico [employer]. Usted puede informar los efectos secundarios a la FDA por telfono al 1-800-FDA-1088. Dnde debo guardar mi medicina? Mantngala fuera del alcance de los nios. Este medicamento puede ser abusado. Mantenga su medicamento en un lugar seguro para protegerlo contra robos. No comparta este medicamento con nadie. Es peligroso vender o Associate Professor y est prohibido por la ley. Gurdela a [employer],  entre 20 y 52 grados C (9 y 61 grados F). Deseche todo el medicamento que no haya utilizado, despus de la fecha de vencimiento. ATENCIN: Este folleto es un resumen. Puede ser que no cubra toda la posible informacin. Si usted tiene preguntas acerca de esta medicina, consulte con su mdico, su farmacutico o su profesional de Technical sales engineer.  2015, Elsevier/Gold Standard. (2006-11-21 11:33:00) Scopolamine skin patches Qu es este medicamento? La ESCOPOLAMINA se [place] para prevenir las nuseas y vmitos asociados con los mareos provocados por el movimiento, anestesia y [place]. Este medicamento puede ser utilizado para otros usos; si tiene alguna pregunta consulte con su proveedor de atencin mdica o con su farmacutico. MARCAS COMERCIALES DISPONIBLES: Transderm Scop Qu le debo informar a mi profesional de la salud antes de tomar este medicamento? Necesita saber si usted presenta alguno de los siguientes problemas o situaciones: -glaucoma -enfermedad renal o heptica -una reaccin alrgica (especialmente cutnea) o inusual a la escopolamina, atropina, otros medicamentos, alimentos, colorantes o conservantes -si est embarazada o buscando quedar embarazada -si est amamantando a un beb Cmo debo utilizar este medicamento? Este medicamento es slo para uso externo. Siga las instrucciones de la etiqueta del [place]. Un parche contiene suficiente medicamento como para prevenir los [employer] por el movimiento durante un mximo de 3 das. Aplique el parche al menos 4 horas antes de necesitarlo y slo use uno por vez. Elija un rea detrs del odo que est limpia, seca, sin vello ni cabello y sin lesiones o irritaciones. Limpie el rea con un pauelo de papel limpio y seco. Quite la [place] de plstico del parche, asegrese de no tocar el lado con [employer] con sus manos. No corte los parches. Aplique firmemente sobre el rea elegida, con el lado metlico del parche hacia la piel y el lado  de color habano hacia fuera. Una vez que est colocado con firmeza, lvese las manos con agua y [place]. Quite el disco despus de  3 das o antes si ya no lo necesita. Despus de quitar el parche, lvese bien las manos y el rea detrs del odo con [place] y [place]. El parche todava contiene algn medicamento despus de usar. Para evitar el contacto accidental o ingestin por nios o mascotas, doble el parche usado por la mitad con los lados pegajosos juntos y deschelo a la basura fuera del alcance de los nios y Copy. Si necesita usar un segundo parche despus de quitar el primero, colquelo detrs del otro odo. Hable con su pediatra para informarse acerca del uso de este medicamento en nios. Puede requerir atencin especial. Sobredosis: Pngase en contacto inmediatamente con un centro toxicolgico o una sala de urgencia si usted cree que haya tomado demasiado medicamento. ATENCIN: [employer] es solo para usted. No comparta este medicamento con nadie. Qu sucede si me olvido de una dosis? Asegrese de Dispensing optician parche por lo menos 4 horas antes de necesitarlo. Puede colocarlo la noche anterior a un viaje. Qu puede interactuar con este medicamento? -benztropina -betanecol -medicamentos para la ansiedad o para los problemas para conciliar el sueo, como el diazepam o temazepam -medicamentos para la fiebre del heno y Programmer, applications -medicamentos para la depresin mental -relajantes musculares Puede ser que esta lista no menciona todas las posibles interacciones. Informe a su profesional de KB Home	[place] de [employer] productos a base de hierbas, medicamentos de [place] o suplementos nutritivos que est tomando. Si usted fuma, consume bebidas alcohlicas o si utiliza drogas ilegales, indqueselo tambin a su profesional de KB Home	[place]. Algunas sustancias pueden interactuar con su medicamento. A qu debo estar atento al usar Coca-Cola? Mantenga el parche seco, si es posible, para evitar  que se desprenda. Limite el contacto con el agua; sin embargo, el baarse o nadar no Theatre stage manager. Si el parche se desprende, deschelo y coloque uno nuevo detrs del otro odo. Puede experimentar mareos o somnolencia. No conduzca ni utilice maquinaria ni haga nada que Associate Professor en estado de alerta hasta que sepa cmo le afecta este medicamento. No se siente ni se ponga de pie con rapidez, especialmente si es un paciente de edad avanzada. Esto reduce el riesgo de mareos o [employer]. El alcohol puede interferir con el efecto de este medicamento. Evite consumir bebidas alcohlicas. Se le podr secar la boca. Masticar chicle sin azcar, chupar caramelos duros y beber agua en abundancia le ayudarn a mantener la boca hmeda. Si el problema no desaparece o es severo, consulte a su mdico. Este medicamento puede resecarle los ojos y provocar visin borrosa. Si [place] lentes de contacto, puede sentir ciertas molestias. Las gotas lubricantes pueden ser tiles. Si el problema no desaparece o es severo, consulte a su mdico de los ojos. Si va a someterse a un procedimiento de imgenes por Health visitor (IRM), informe a su tcnico de IRM si tiene este parche aplicado sobre su cuerpo. Debe quitarlo antes de un IRM. Qu efectos secundarios puedo tener al [employer] este medicamento? Efectos secundarios que debe informar a su mdico o a Barrister's clerk de la salud tan pronto como sea posible: -agitacin, nerviosismo, confusin -visin borrosa y otros problemas oculares -mareos, somnolencia -dolor o enrojecimiento de los ojos -alucinaciones -dolor o dificultad para orinar -erupcin cutnea, picazn -vmito Efectos secundarios que, por lo general, no requieren Geophysical data processor (debe informarlos a su mdico o a su profesional de la salud si persisten o si son molestos): -dolor de cabeza -nuseas Puede ser que esta lista no menciona  todos los posibles efectos secundarios. Comunquese a su mdico por  asesoramiento mdico [employer]. Usted puede informar los efectos secundarios a la FDA por telfono al 1-800-FDA-1088. Dnde debo guardar mi medicina? Mantngala fuera del alcance de los nios. Gurdela a [employer], entre 20 y 53 grados C (44 y 63 grados F). Deseche todos los medicamentos que no haya utilizado, despus de la fecha de vencimiento. Cuando se quite el parche, doble lo a la mitad y deschelo a la basura como se describi anteriormente. ATENCIN: Este folleto es un resumen. Puede ser que no cubra toda la posible informacin. Si usted tiene preguntas acerca de esta medicina, consulte con su mdico, su farmacutico o su profesional de Technical sales engineer.  2015, Elsevier/Gold Standard. (2011-07-17 17:54:45) Levonorgestrel intrauterine device (IUD) Qu es este medicamento? El LEVONORGESTREL (DIU) es un dispositivo anticonceptivo (control de natalidad). El dispositivo se coloca dentro del tero por un profesional de la salud. Se utiliza para Therapist, occupational y tambin se puede Risk manager para tratar el sangrado abundante que ocurre durante su perodo. Dependiendo del dispositivo, se puede utilizar por 3 a 5 aos. Este medicamento puede ser utilizado para otros usos; si tiene alguna pregunta consulte con su proveedor de atencin mdica o con su farmacutico. MARCAS COMERCIALES DISPONIBLES: [place], [place] le debo informar a mi profesional de la salud antes de tomar este medicamento? Necesita saber si usted presenta alguno de los siguientes problemas o situaciones: -exmen de Papanicolaou anormal -cncer de mama, cuello del tero o tero -diabetes -endometritis -si tiene una infeccin plvica o genital actual o en el pasado -tiene ms de una pareja sexual o si su pareja tiene ms de una pareja -enfermedad cardiaca -antecedente de embarazo tubrico o ectpico -problemas del sistema inmunolgico -DIU colocado -enfermedad heptica o tumor del  hgado -problemas con la coagulacin o si toma diluyentes sanguneos -[place] medicamentos intravenoso -forma inusual del tero -sangrado vaginal que no tiene explicacin -una reaccin alrgica o inusual al levonorgestrel, a otras hormonas, a la silicona o polietilenos, a otros medicamentos, alimentos, colorantes o conservantes -si est embarazada o buscando quedar embarazada -si est amamantando a un beb Cmo debo utilizar este medicamento? Un profesional de Estate agent este dispositivo en el tero. Hable con su pediatra para informarse acerca del uso de este medicamento en nios. Puede requerir atencin especial. Sobredosis: Pngase en contacto inmediatamente con un centro toxicolgico o una sala de urgencia si usted cree que haya tomado demasiado medicamento. ATENCIN: [employer] es solo para usted. No comparta este medicamento con nadie. Qu sucede si me olvido de una dosis? No se aplica en este caso. Qu puede interactuar con este medicamento? No tome esta medicina con ninguno de los siguientes medicamentos: -amprenavir -bosentano -fosamprenavir Esta medicina tambin puede interactuar con los siguientes medicamentos: -aprepitant -barbitricos para producir el sueo o para el tratamiento de convulsiones -bexaroteno -griseofulvina -medicamentos para tratar los convulsiones, tales como [place], [place], [place], [place], [place], topiramato -modafinilo -pioglitazona -rifabutina -rifampicina -rifapentina -algunos medicamentos para tratar el virus VIH, tales como atazanavir, indinavir, lopinavir, nelfinavir, tipranavir, ritonavir -hierba de [place] -warfarina Puede ser que esta lista no menciona todas las posibles interacciones. Informe a su profesional de KB Home	[place] de [employer] productos a base de hierbas, medicamentos de [place] o suplementos nutritivos que est tomando. Si usted fuma, consume bebidas alcohlicas o si utiliza drogas ilegales,  indqueselo tambin a su profesional de KB Home	[place]. Algunas sustancias pueden interactuar con su medicamento. A qu debo estar atento  al usar Coca-Cola? Visite a su mdico o a su profesional de la salud para chequear su evolucin peridicamente. Visite a su mdico si usted o su pareja tiene relaciones sexuales con [employer], se vuelve VIH positivo o contrae una enfermedad de transmisin sexual. Este medicamento no la protege de la infeccin por VIH (SIDA) ni de ninguna otra enfermedad de transmisin sexual. Puede controlar la ubicacin del DIU usted misma palpando con sus dedos limpios los hilos en la parte anterior de la vagina. No tire de los hilos. Es un buen hbito controlar la ubicacin del dispositivo despus de cada perodo menstrual. Si no slo siente los hilos sino que adems siente otra parte ms del DIU o si no puede sentir los hilos, consulte a su mdico inmediatamente. El DIU puede salirse por s solo. Puede quedar embarazada si el dispositivo se sale de Chief of Staff. Utilice un mtodo anticonceptivo adicional, como preservativos, y consulte a su proveedor de atencin mdica s observa que el DIU se sali de Chief of Staff. La utilizacin de tampones no cambia la posicin del DIU y no hay inconvenientes en usarlos durante su perodo. Qu efectos secundarios puedo tener al [employer] este medicamento? Efectos secundarios que debe informar a su mdico o a Barrister's clerk de la salud tan pronto como sea posible: -Chief of Staff como erupcin cutnea, picazn o urticarias, hinchazn de la cara, labios o lengua -fiebre, sntomas gripales -llagas genitales -alta presin sangunea -ausencia de un perodo menstrual durante 6 semanas mientras lo utiliza -Social research officer, government, Occupational hygienist en las piernas -dolor o sensibilidad del plvico -dolor de cabeza repentino o severo -signos de Media planner -calambres estomacales -falta de aliento repentina -problemas de coordinacin, del habla, al  caminar -sangrado, flujo vaginal inusual -color amarillento de los ojos o la piel Efectos secundarios que, por lo general, no requieren atencin mdica (debe informarlos a su mdico o a su profesional de la salud si persisten o si son molestos): -acn -dolor de pecho -cambios en el deseo sexual o capacidad -cambios de peso -calambres, Tree surgeon o sensacin de [employer] se introduce el dispositivo -dolor de cabeza -sangrado menstruales irregulares en los primeros 3 a 6 meses de usar -nuseas Puede ser que esta lista no menciona todos los posibles efectos secundarios. Comunquese a su mdico por asesoramiento mdico [employer]. Usted puede informar los efectos secundarios a la FDA por telfono al 1-800-FDA-1088. Dnde debo guardar mi medicina? No se aplica en este caso. ATENCIN: Este folleto es un resumen. Puede ser que no cubra toda la posible informacin. Si usted tiene preguntas acerca de esta medicina, consulte con su mdico, su farmacutico o su profesional de Technical sales engineer.  2015, Elsevier/Gold Standard. (2011-03-19 16:57:41)

## 2014-06-09 ENCOUNTER — Telehealth: Payer: Self-pay | Admitting: Gynecology

## 2014-06-09 NOTE — Telephone Encounter (Signed)
06/09/14-Pt has new BC card. I rechecked her Mirena benefits and it is covered for contraception at 100%, no copay. Blanca to call patient with this information to speak with her in [place]. Remind patient needs to be done while on cycle. She will call us to schedule/wl

## 2014-09-12 ENCOUNTER — Ambulatory Visit: Payer: BLUE CROSS/BLUE SHIELD | Admitting: Gynecology

## 2014-09-12 ENCOUNTER — Ambulatory Visit (INDEPENDENT_AMBULATORY_CARE_PROVIDER_SITE_OTHER): Payer: BLUE CROSS/BLUE SHIELD | Admitting: Gynecology

## 2014-09-12 ENCOUNTER — Encounter: Payer: Self-pay | Admitting: Gynecology

## 2014-09-12 VITALS — BP 120/76

## 2014-09-12 DIAGNOSIS — B373 Candidiasis of vulva and vagina: Secondary | ICD-10-CM

## 2014-09-12 DIAGNOSIS — B3731 Acute candidiasis of vulva and vagina: Secondary | ICD-10-CM

## 2014-09-12 LAB — WET PREP FOR TRICH, YEAST, CLUE
CLUE CELLS WET PREP: NONE SEEN
TRICH WET PREP: NONE SEEN

## 2014-09-12 MED ORDER — FLUCONAZOLE 150 MG PO TABS: 150.0000 mg | ORAL_TABLET | ORAL | Status: DC

## 2014-09-12 NOTE — Patient Instructions (Signed)
Take the yeast pill today and then repeat it 2 days from now  Call if your symptoms persist, worsen or recur.  Vaginitis monilisica (Monilial Vaginitis) La vaginitis es una inflamacin (irritacin, hinchazn) de la vagina y la vulva. Esta no es una enfermedad de transmisin sexual.  CAUSAS Este tipo de vaginitis lo causa un hongo (candida) que normalmente se encuentra en la vagina. El hongo candida se ha desarrollado hasta el punto de ocasionar problemas en el equilibrio qumico. SNTOMAS  Secrecin vaginal espesa y blanca.  Hinchazn, picazn, enrojecimiento e inflamacin de la vagina y en algunos casos de los labios vaginales (vulva).  Ardor o dolor al [employer].  Dolor en [place]. DIAGNSTICO Los factores que favorecen la vaginitis moniliasica son:  Kyla Balzarine de virginidad y postmenopusicas.  Embarazo.  Infecciones.  Sentir cansancio, estar enferma o estresada, especialmente si ya ha sufrido este problema en el pasado.  Diabetes Buen control ayudar a disminur la probabilidad.  Pldoras anticonceptivas  Ropa interior [place].  El uso de espumas de bao, aerosoles femeninos duchas vaginales o tampones con desodorante.  Algunos antibiticos (medicamentos que destruyen grmenes).  Si contrae alguna enfermedad puede sufrir recurrencias espordicas. [place] profesional que lo asiste prescribir medicamentos.  Hay diferentes tipos de cremas y supositorios vaginales que tratan especficamente la vaginitis monilisica. Para infecciones por hongos recurrentes, utilice un supositorio o crema en la vagina dos veces por semana, o segn se le indique.  Tambin podrn utilizarse cremas con corticoides o anti monilisicas para la picazn o la irritacin de la vulva. Consulte con el profesional que la asiste.  Si la crema no da resultado, podr aplicarse en la vagina una solucin con azul de metileno.  El consumo de yogur puede prevenir este tipo de  vaginitis. INSTRUCCIONES [place] todos los medicamentos tal como se le indic.  No mantenga relaciones sexuales hasta que el tratamiento se haya completado, o segn las indicaciones del profesional que la asiste.  Tome baos de asiento tibios.  No se aplique duchas vaginales.  No utilice tampones, especialmente los perfumados.  Use ropa interior de algodn  [employer] pantalones ajustados y las medias tipo panty.  Comunique a sus compaeros sexuales que sufre una infeccin por hongos. Ellos deben concurrir para un control mdico si tienen sntomas como una urticaria leve o picazn.  Sus compaeros sexuales deben tratarse tambin si la infeccin es difcil de Radiographer, therapeutic.  Practique el sexo seguro - use condones  Algunos medicamentos vaginales ocasionan fallas en los condones de ltex. Los medicamentos vaginales que pueden daar los condones son:  Building services engineer cleocina  Butoconazole (Femstat)  Terconazole (Terazol) supositorios vaginales  Miconazole (Monistat) (es un medicamento de venta libre) SOLICITE ATENCIN MDICA SI:  Waldron Session tiene una temperatura oral de ms de 38,9 C (102 F).  Si la infeccin empeora luego de 2 das de tratamiento.  Si la infeccin no mejora luego de 3 das de tratamiento.  Aparecen ampollas en o alrededor de la vagina.  Si aparece una hemorragia vaginal y no es el momento del perodo.  Siente dolor al [employer].  Presenta problemas intestinales.  Tiene dolor durante las [employer]. Document Released: 11/07/2004 Document Revised: 04/22/2011 [facility] Patient Information 2015 [place]. This information is not intended to replace advice given to you by your health care provider. Make sure you discuss any questions you have with your health care provider.

## 2014-09-12 NOTE — Addendum Note (Signed)
Addended by: Nelva Nay on: 09/12/2014 04:13 PM   Modules accepted: Orders

## 2014-09-12 NOTE — Progress Notes (Signed)
Monica Phelps [birth year] [id number]        29 y.o.  [id number] Presents complaining of one week's history of vaginal itching and discharge. Abdomen placed on antibiotics 2 weeks ago due to pharyngitis. No vaginal odor. No urinary symptoms such as frequency dysuria or urgency. Notes that her pharyngitis symptoms have resolved. Had a similar episode of vaginitis following antibiotics in the past. Had placed yogurt in her vagina 2 days ago hoping this would give her some relief.  Patient's mother and separate interpreter were present during the entire visit.  Past medical history,surgical history, problem list, medications, allergies, family history and social history were all reviewed and documented in the EPIC chart.  Directed ROS with pertinent positives and negatives documented in the history of present illness/assessment and plan.  Exam: Kim assistant Filed Vitals:   09/12/14 1509  BP: 120/76   General appearance:  Normal Abdomen soft nontender without masses guarding rebound Pelvic external BUS vagina erythematous with white discharge. Cervix normal. Uterus normal size midline mobile nontender. Adnexa without masses or tenderness.  Assessment/Plan:  29 y.o. [id number] with above history. Exam and wet preps consistent with yeast vaginitis. Will treat with Diflucan 150 mg every other day 2 doses. Follow up if symptoms persist, worsen or recur.    Anastasio Auerbach MD, 3:29 PM 09/12/2014

## 2014-09-27 ENCOUNTER — Other Ambulatory Visit (HOSPITAL_COMMUNITY)
Admission: RE | Admit: 2014-09-27 | Discharge: 2014-09-27 | Disposition: A | Discharge status: Home / Self Care | Payer: BLUE CROSS/BLUE SHIELD | Source: Ambulatory Visit | Attending: Gynecology | Admitting: Gynecology

## 2014-09-27 ENCOUNTER — Encounter: Payer: Self-pay | Admitting: Gynecology

## 2014-09-27 ENCOUNTER — Ambulatory Visit (INDEPENDENT_AMBULATORY_CARE_PROVIDER_SITE_OTHER): Payer: BLUE CROSS/BLUE SHIELD | Admitting: Gynecology

## 2014-09-27 VITALS — BP 120/76 | Ht 63.5 in | Wt 150.0 lb

## 2014-09-27 DIAGNOSIS — Z01419 Encounter for gynecological examination (general) (routine) without abnormal findings: Secondary | ICD-10-CM | POA: Insufficient documentation

## 2014-09-27 DIAGNOSIS — K5901 Slow transit constipation: Secondary | ICD-10-CM

## 2014-09-27 NOTE — Progress Notes (Signed)
Monica Phelps [birth year] [id number]   History:    29 y.o.  for annual gyn exam with occasional dyspareunia since she was recently treated for yeast infection. She reports no vaginal discharge today. She reports normal menstrual cycles every 35 days. She is currently using the rhythm of her contraception. Patient stated several years ago in another practice because of cervicitis she had cryotherapy of her cervix.In early 2015 patient had a second trimester miscarriage at [redacted] weeks gestation. She had been at bedrest since 16 weeks secondary to preterm premature ruptured membranes. She continued to bleed was taken back to the operating room for a D&C was performed believing there was probably retained products of conception and she has continued to bleed. She had been placed on antibiotic for suspected chorioamnionitis leading to her preterm premature rupture membranes. She stated also says she was tested and was positive for group B strep. They had tested her for von Willebrand along with CBC and PT and PTT as well as Leiden V Factor, as well as antiphospholipid antibodies IgG and IgA all studies were normal. She also had a normal TSH. In 2015 she had a laparoscopy with a report had indicated she had endometriosis of the left uterosacral ligament which had been cauterized at the time. Patient reports no past history of any abnormal Pap smears.  Patient had a normal ultrasound and sonohysterogram in 2015. In September 2015 patient had a left lumpectomy for fibroadenoma   Past medical history,surgical history, family history and social history were all reviewed and documented in the EPIC chart.  Gynecologic History Patient's last menstrual period was 08/26/2014. Contraception: rhythm method Last Pap: 2015. Results were: normal Last mammogram: Not indicated. Results were: Not indicated  Obstetric History OB History  Gravida Para Term Preterm AB SAB TAB Ectopic Multiple Living  1    1 1     0    # Outcome Date GA Lbr Len/2nd Weight Sex Delivery Anes PTL Lv  1 SAB                ROS: A ROS was performed and pertinent positives and negatives are included in the history.  GENERAL: No fevers or chills. HEENT: No change in vision, no earache, sore throat or sinus congestion. NECK: No pain or stiffness. CARDIOVASCULAR: No chest pain or pressure. No palpitations. PULMONARY: No shortness of breath, cough or wheeze. GASTROINTESTINAL: No abdominal pain, nausea, vomiting or diarrhea, melena or bright red blood per rectum. GENITOURINARY: No urinary frequency, urgency, hesitancy or dysuria. MUSCULOSKELETAL: No joint or muscle pain, no back pain, no recent trauma. DERMATOLOGIC: No rash, no itching, no lesions. ENDOCRINE: No polyuria, polydipsia, no heat or cold intolerance. No recent change in weight. HEMATOLOGICAL: No anemia or easy bruising or bleeding. NEUROLOGIC: No headache, seizures, numbness, tingling or weakness. PSYCHIATRIC: No depression, no loss of interest in normal activity or change in sleep pattern.     Exam: chaperone present  BP 120/76 mmHg  Ht 5' 3.5" (1.613 m)  Wt 150 lb (68.04 kg)  BMI 26.15 kg/m2  LMP 08/26/2014  Body mass index is 26.15 kg/(m^2).  General appearance : Well developed well nourished female. No acute distress HEENT: Eyes: no retinal hemorrhage or exudates,  Neck supple, trachea midline, no carotid bruits, no thyroidmegaly Lungs: Clear to auscultation, no rhonchi or wheezes, or rib retractions  Heart: Regular rate and rhythm, no murmurs or gallops Breast:Examined in sitting and supine position were symmetrical in appearance, no palpable masses or tenderness,  no skin retraction, no nipple inversion, no nipple discharge, no skin discoloration, no axillary or supraclavicular lymphadenopathy Abdomen: no palpable masses or tenderness, no rebound or guarding Extremities: no edema or skin discoloration or tenderness  Pelvic:  Bartholin, Urethra, Skene Glands:  Within normal limits             Vagina: No gross lesions or discharge  Cervix: No gross lesions or discharge  Uterus  anteverted, normal size, shape and consistency, non-tender and mobile  Adnexa  Without masses or tenderness  Anus and perineum  normal   Rectovaginal  normal sphincter tone without palpated masses or tenderness             Hemoccult not indicated     Assessment/Plan:  29 y.o. female for annual exam was instructed to take prenatal vitamins that she is using rhythm for contraception. Patient was reminded of the importance of monthly breast exam. Patient will return back to the office sometime next week in a fasting state for the following screening blood work: Fasting lipid profile, comprehensive metabolic panel, TSH, CBC, and urinalysis. For constipation she was recommended to use MiraLAX 1 tablespoon with her juice every morning. She was also instructed to use Astro glide. During intercourse.   Terrance Mass MD, 2:38 PM 09/27/2014

## 2014-09-28 LAB — URINALYSIS W MICROSCOPIC + REFLEX CULTURE
Bacteria, UA: NONE SEEN [HPF]
Bilirubin Urine: NEGATIVE
CASTS: NONE SEEN [LPF]
CRYSTALS: NONE SEEN [HPF]
Glucose, UA: NEGATIVE
HGB URINE DIPSTICK: NEGATIVE
KETONES UR: NEGATIVE
Leukocytes, UA: NEGATIVE
NITRITE: NEGATIVE
PH: 7.5 (ref 5.0–8.0)
PROTEIN: NEGATIVE
RBC / HPF: NONE SEEN RBC/HPF (ref <=2)
Specific Gravity, Urine: 1.013 (ref 1.001–1.035)
WBC, UA: NONE SEEN WBC/HPF (ref <=5)
YEAST: NONE SEEN [HPF]

## 2014-09-29 ENCOUNTER — Other Ambulatory Visit: Payer: BLUE CROSS/BLUE SHIELD

## 2014-09-29 LAB — CBC WITH DIFFERENTIAL/PLATELET
BASOS ABS: 0.1 10*3/uL (ref 0.0–0.1)
Basophils Relative: 1 % (ref 0–1)
Eosinophils Absolute: 0.2 10*3/uL (ref 0.0–0.7)
Eosinophils Relative: 4 % (ref 0–5)
HEMATOCRIT: 40.4 % (ref 36.0–46.0)
Hemoglobin: 13.5 g/dL (ref 12.0–15.0)
Lymphocytes Relative: 40 % (ref 12–46)
Lymphs Abs: 2.5 10*3/uL (ref 0.7–4.0)
MCH: 30.6 pg (ref 26.0–34.0)
MCHC: 33.4 g/dL (ref 30.0–36.0)
MCV: 91.6 fL (ref 78.0–100.0)
MPV: 10.4 fL (ref 8.6–12.4)
Monocytes Absolute: 0.6 10*3/uL (ref 0.1–1.0)
Monocytes Relative: 9 % (ref 3–12)
NEUTROS ABS: 2.9 10*3/uL (ref 1.7–7.7)
Neutrophils Relative %: 46 % (ref 43–77)
Platelets: 300 10*3/uL (ref 150–400)
RBC: 4.41 MIL/uL (ref 3.87–5.11)
RDW: 13 % (ref 11.5–15.5)
WBC: 6.2 10*3/uL (ref 4.0–10.5)

## 2014-09-29 LAB — TSH: TSH: 2.412 u[IU]/mL (ref 0.350–4.500)

## 2014-09-29 LAB — LIPID PANEL
Cholesterol: 130 mg/dL (ref 125–200)
HDL: 44 mg/dL — AB (ref >=46)
LDL Cholesterol: 77 mg/dL (ref <130)
TRIGLYCERIDES: 47 mg/dL (ref <150)
Total CHOL/HDL Ratio: 3 Ratio (ref <=5.0)
VLDL: 9 mg/dL (ref <30)

## 2014-09-29 LAB — COMPREHENSIVE METABOLIC PANEL
ALBUMIN: 3.9 g/dL (ref 3.6–5.1)
ALK PHOS: 61 U/L (ref 33–115)
ALT: 10 U/L (ref 6–29)
AST: 15 U/L (ref 10–30)
BUN: 10 mg/dL (ref 7–25)
CALCIUM: 8.4 mg/dL — AB (ref 8.6–10.2)
CO2: 27 mmol/L (ref 20–31)
Chloride: 103 mmol/L (ref 98–110)
Creat: 0.57 mg/dL (ref 0.50–1.10)
Glucose, Bld: 82 mg/dL (ref 65–99)
Potassium: 4 mmol/L (ref 3.5–5.3)
Sodium: 136 mmol/L (ref 135–146)
TOTAL PROTEIN: 6.6 g/dL (ref 6.1–8.1)
Total Bilirubin: 0.5 mg/dL (ref 0.2–1.2)

## 2014-09-30 LAB — CYTOLOGY - PAP

## 2014-10-27 ENCOUNTER — Other Ambulatory Visit: Payer: Self-pay | Admitting: *Deleted

## 2014-10-27 ENCOUNTER — Other Ambulatory Visit (HOSPITAL_COMMUNITY): Payer: Self-pay | Admitting: *Deleted

## 2014-10-27 DIAGNOSIS — R109 Unspecified abdominal pain: Secondary | ICD-10-CM

## 2014-10-27 DIAGNOSIS — R1030 Lower abdominal pain, unspecified: Secondary | ICD-10-CM

## 2014-10-28 ENCOUNTER — Emergency Department (HOSPITAL_COMMUNITY): Payer: BLUE CROSS/BLUE SHIELD

## 2014-10-28 ENCOUNTER — Emergency Department (HOSPITAL_COMMUNITY)
Admission: EM | Admit: 2014-10-28 | Discharge: 2014-10-28 | Disposition: A | Discharge status: Home / Self Care | Payer: BLUE CROSS/BLUE SHIELD | Attending: Emergency Medicine | Admitting: Emergency Medicine

## 2014-10-28 ENCOUNTER — Encounter (HOSPITAL_COMMUNITY): Payer: Self-pay | Admitting: Emergency Medicine

## 2014-10-28 DIAGNOSIS — Z3202 Encounter for pregnancy test, result negative: Secondary | ICD-10-CM | POA: Diagnosis not present

## 2014-10-28 DIAGNOSIS — Z86018 Personal history of other benign neoplasm: Secondary | ICD-10-CM | POA: Insufficient documentation

## 2014-10-28 DIAGNOSIS — K9289 Other specified diseases of the digestive system: Secondary | ICD-10-CM | POA: Diagnosis not present

## 2014-10-28 DIAGNOSIS — Z8742 Personal history of other diseases of the female genital tract: Secondary | ICD-10-CM | POA: Diagnosis not present

## 2014-10-28 DIAGNOSIS — Z91040 Latex allergy status: Secondary | ICD-10-CM | POA: Insufficient documentation

## 2014-10-28 DIAGNOSIS — Z87440 Personal history of urinary (tract) infections: Secondary | ICD-10-CM | POA: Insufficient documentation

## 2014-10-28 DIAGNOSIS — R102 Pelvic and perineal pain: Secondary | ICD-10-CM

## 2014-10-28 DIAGNOSIS — Z79899 Other long term (current) drug therapy: Secondary | ICD-10-CM | POA: Insufficient documentation

## 2014-10-28 DIAGNOSIS — J45909 Unspecified asthma, uncomplicated: Secondary | ICD-10-CM | POA: Diagnosis not present

## 2014-10-28 DIAGNOSIS — N83519 Torsion of ovary and ovarian pedicle, unspecified side: Secondary | ICD-10-CM

## 2014-10-28 DIAGNOSIS — Z8659 Personal history of other mental and behavioral disorders: Secondary | ICD-10-CM | POA: Diagnosis not present

## 2014-10-28 DIAGNOSIS — K639 Disease of intestine, unspecified: Secondary | ICD-10-CM

## 2014-10-28 DIAGNOSIS — R1031 Right lower quadrant pain: Secondary | ICD-10-CM | POA: Diagnosis present

## 2014-10-28 LAB — COMPREHENSIVE METABOLIC PANEL
ALBUMIN: 4.2 g/dL (ref 3.5–5.0)
ALT: 13 U/L — ABNORMAL LOW (ref 14–54)
ANION GAP: 5 (ref 5–15)
AST: 19 U/L (ref 15–41)
Alkaline Phosphatase: 66 U/L (ref 38–126)
BILIRUBIN TOTAL: 0.6 mg/dL (ref 0.3–1.2)
BUN: 10 mg/dL (ref 6–20)
CHLORIDE: 105 mmol/L (ref 101–111)
CO2: 27 mmol/L (ref 22–32)
Calcium: 8.9 mg/dL (ref 8.9–10.3)
Creatinine, Ser: 0.56 mg/dL (ref 0.44–1.00)
GFR calc Af Amer: >60 mL/min (ref >60)
GFR calc non Af Amer: >60 mL/min (ref >60)
GLUCOSE: 86 mg/dL (ref 65–99)
POTASSIUM: 4.1 mmol/L (ref 3.5–5.1)
SODIUM: 137 mmol/L (ref 135–145)
TOTAL PROTEIN: 7.2 g/dL (ref 6.5–8.1)

## 2014-10-28 LAB — URINALYSIS, ROUTINE W REFLEX MICROSCOPIC
Bilirubin Urine: NEGATIVE
Glucose, UA: NEGATIVE mg/dL
Hgb urine dipstick: NEGATIVE
Ketones, ur: NEGATIVE mg/dL
NITRITE: NEGATIVE
Protein, ur: NEGATIVE mg/dL
SPECIFIC GRAVITY, URINE: 1.018 (ref 1.005–1.030)
UROBILINOGEN UA: 0.2 mg/dL (ref 0.0–1.0)
pH: 7.5 (ref 5.0–8.0)

## 2014-10-28 LAB — CBC
HEMATOCRIT: 40.5 % (ref 36.0–46.0)
HEMOGLOBIN: 13.5 g/dL (ref 12.0–15.0)
MCH: 30.5 pg (ref 26.0–34.0)
MCHC: 33.3 g/dL (ref 30.0–36.0)
MCV: 91.6 fL (ref 78.0–100.0)
Platelets: 281 10*3/uL (ref 150–400)
RBC: 4.42 MIL/uL (ref 3.87–5.11)
RDW: 12.3 % (ref 11.5–15.5)
WBC: 5.1 10*3/uL (ref 4.0–10.5)

## 2014-10-28 LAB — URINE MICROSCOPIC-ADD ON

## 2014-10-28 LAB — I-STAT BETA HCG BLOOD, ED (MC, WL, AP ONLY)

## 2014-10-28 LAB — LIPASE, BLOOD: LIPASE: 18 U/L — AB (ref 22–51)

## 2014-10-28 MED ORDER — IOHEXOL 300 MG/ML  SOLN
100.0000 mL | Freq: Once | INTRAMUSCULAR | Status: AC | PRN
  Administered 2014-10-28: 100 mL via INTRAVENOUS

## 2014-10-28 MED ORDER — IOHEXOL 300 MG/ML  SOLN
25.0000 mL | Freq: Once | INTRAMUSCULAR | Status: AC | PRN
  Administered 2014-10-28: 25 mL via ORAL

## 2014-10-28 MED ORDER — METRONIDAZOLE 500 MG PO TABS: 500.0000 mg | ORAL_TABLET | Freq: Three times a day (TID) | ORAL | Status: DC

## 2014-10-28 MED ORDER — FLUCONAZOLE 150 MG PO TABS: 150.0000 mg | ORAL_TABLET | Freq: Every day | ORAL | Status: DC

## 2014-10-28 MED ORDER — NAPROXEN 250 MG PO TABS: 250.0000 mg | ORAL_TABLET | Freq: Two times a day (BID) | ORAL | Status: DC | PRN

## 2014-10-28 MED ORDER — CIPROFLOXACIN HCL 500 MG PO TABS: 500.0000 mg | ORAL_TABLET | Freq: Two times a day (BID) | ORAL | Status: DC

## 2014-10-28 MED ORDER — SODIUM CHLORIDE 0.9 % IV SOLN
INTRAVENOUS | Status: DC
  Administered 2014-10-28: 15:00:00 via INTRAVENOUS

## 2014-10-28 NOTE — ED Provider Notes (Signed)
CSN: [id number]     Arrival date & time 10/28/14  1048 History   First MD Initiated Contact with Patient 10/28/14 1243     Chief Complaint  Patient presents with  . Abdominal Pain    RLQ, pain with ambulation      HPI Pt was seen at 1250.  Per pt, c/o gradual onset and persistence of constant RLQ "pain" for the past 2 days.  Has been associated with nausea.  Describes the abd pain as "sharp."  Denies vomiting/diarrhea, no fevers, no back pain, no rash, no CP/SOB, no black or blood in stools, no dysuria/hematuria, no vaginal bleeding/discharge.      Past Medical History  Diagnosis Date  . Reflux   . UTI (lower urinary tract infection)   . Constipation   . Gastritis   . Fibroid   . History of bleeding disorder as a child   . Asthma   . Headache(784.0)     migraines  . GERD (gastroesophageal reflux disease)   . Anxiety     paniac  . Blood dyscrasia     pt states hemorrhaged with last period 12/14 and was hospitalized in [place]  . Endometriosis   . Complication of anesthesia   . PONV (postoperative nausea and vomiting)    Past Surgical History  Procedure Laterality Date  . Dilation and curettage of uterus    . Induced abortion      at 5 months fetal anomalies hemorrhaged afterwards  . Dilation and curettage of uterus N/A 03/03/2013    Procedure: DILATATION AND CURETTAGE;  Surgeon: Emily Filbert, MD;  Location: [facility];  Service: Gynecology;  Laterality: N/A;  . Laparoscopy N/A 03/03/2013    Procedure: LAPAROSCOPY DIAGNOSTIC;  Surgeon: Emily Filbert, MD;  Location: [facility];  Service: Gynecology;  Laterality: N/A;  . Laparoscopic lysis of adhesions N/A 03/03/2013    Procedure: LAPAROSCOPIC LYSIS OF ADHESIONS;  Surgeon: Emily Filbert, MD;  Location: [facility];  Service: Gynecology;  Laterality: N/A;  . Mass excision Left 10/27/2013    Procedure: EXCISION  LEFT BREAST MASS;  Surgeon: Stark Klein, MD;  Location: [facility];  Service: General;  Laterality: Left;   Family History   Problem Relation Age of Onset  . Alcohol abuse Neg Hx   . Arthritis Neg Hx   . Asthma Neg Hx   . Birth defects Neg Hx   . Cancer Neg Hx   . Depression Neg Hx   . COPD Neg Hx   . Diabetes Neg Hx   . Drug abuse Neg Hx   . Early death Neg Hx   . Hearing loss Neg Hx   . Kidney disease Neg Hx   . Learning disabilities Neg Hx   . Mental illness Neg Hx   . Mental retardation Neg Hx   . Miscarriages / Stillbirths Neg Hx   . Vision loss Neg Hx   . Hypertension Mother   . Heart disease Mother   . Stroke Mother   . Hyperlipidemia Father    Social History  Substance Use Topics  . Smoking status: Never Smoker   . Smokeless tobacco: Never Used  . Alcohol Use: No   OB History    Gravida Para Term Preterm AB TAB SAB Ectopic Multiple Living   1    1  1    0     Review of Systems ROS: Statement: All systems negative except as marked or noted in the HPI; Constitutional: Negative for  fever and chills. ; ; Eyes: Negative for eye pain, redness and discharge. ; ; ENMT: Negative for ear pain, hoarseness, nasal congestion, sinus pressure and sore throat. ; ; Cardiovascular: Negative for chest pain, palpitations, diaphoresis, dyspnea and peripheral edema. ; ; Respiratory: Negative for cough, wheezing and stridor. ; ; Gastrointestinal: +abd pain, nausea. Negative for vomiting, diarrhea, blood in stool, hematemesis, jaundice and rectal bleeding. . ; ; Genitourinary: Negative for dysuria, flank pain and hematuria. ; ; GYN:  No vaginal bleeding, no vaginal discharge, no vulvar pain. ;; Musculoskeletal: Negative for back pain and neck pain. Negative for swelling and trauma.; ; Skin: Negative for pruritus, rash, abrasions, blisters, bruising and skin lesion.; ; Neuro: Negative for headache, lightheadedness and neck stiffness. Negative for weakness, altered level of consciousness , altered mental status, extremity weakness, paresthesias, involuntary movement, seizure and syncope.       Allergies  Dilaudid;  Morphine and related; and Latex  Home Medications   Prior to Admission medications   Medication Sig Start Date End Date Taking? Authorizing Provider  Ascorbic Acid (VITAMIN C PO) Take 1 tablet by mouth daily.   Yes Historical Provider, MD  CALCIUM PO Take 1 tablet by mouth daily.   Yes Historical Provider, MD  Cholecalciferol (VITAMIN D PO) Take 1 tablet by mouth daily.   Yes Historical Provider, MD  VITAMIN A PO Take 1 tablet by mouth daily.   Yes Historical Provider, MD  VITAMIN E PO Take 1 tablet by mouth daily.   Yes Historical Provider, MD   BP 118/71 mmHg  Pulse 63  Temp(Src) 97.7 F (36.5 C) (Oral)  Resp 15  Ht 5\' 4"  (1.626 m)  Wt 145 lb (65.772 kg)  BMI 24.88 kg/m2  SpO2 100%  LMP 10/01/2014 (Exact Date) Physical Exam  1255: Physical examination:  Nursing notes reviewed; Vital signs and O2 SAT reviewed;  Constitutional: Well developed, Well nourished, Well hydrated, In no acute distress; Head:  Normocephalic, atraumatic; Eyes: EOMI, PERRL, No scleral icterus; ENMT: Mouth and pharynx normal, Mucous membranes moist; Neck: Supple, Full range of motion, No lymphadenopathy; Cardiovascular: Regular rate and rhythm, No murmur, rub, or gallop; Respiratory: Breath sounds clear & equal bilaterally, No rales, rhonchi, wheezes.  Speaking full sentences with ease, Normal respiratory effort/excursion; Chest: Nontender, Movement normal; Abdomen: Soft, +RLQ tenderness to palp. No rebound or guarding. Nondistended, Normal bowel sounds; Genitourinary: No CVA tenderness; Extremities: Pulses normal, No tenderness, No edema, No calf edema or asymmetry.; Neuro: AA&Ox3, Major CN grossly intact.  Speech clear. No gross focal motor or sensory deficits in extremities.; Skin: Color normal, Warm, Dry.    ED Course  Procedures (including critical care time) Labs Review   Imaging Review  I have personally reviewed and evaluated these images and lab results as part of my medical decision-making.   EKG  Interpretation None      MDM  MDM Reviewed: previous chart, nursing note and vitals Reviewed previous: labs Interpretation: labs, ultrasound and CT scan     Results for orders placed or performed during the hospital encounter of 10/28/14  Lipase, blood  Result Value Ref Range   Lipase 18 (L) 22 - 51 U/L  Comprehensive metabolic panel  Result Value Ref Range   Sodium 137 135 - 145 mmol/L   Potassium 4.1 3.5 - 5.1 mmol/L   Chloride 105 101 - 111 mmol/L   CO2 27 22 - 32 mmol/L   Glucose, Bld 86 65 - 99 mg/dL   BUN 10 6 -  20 mg/dL   Creatinine, Ser 0.56 0.44 - 1.00 mg/dL   Calcium 8.9 8.9 - 10.3 mg/dL   Total Protein 7.2 6.5 - 8.1 g/dL   Albumin 4.2 3.5 - 5.0 g/dL   AST 19 15 - 41 U/L   ALT 13 (L) 14 - 54 U/L   Alkaline Phosphatase 66 38 - 126 U/L   Total Bilirubin 0.6 0.3 - 1.2 mg/dL   GFR calc non Af Amer >60 >60 mL/min   GFR calc Af Amer >60 >60 mL/min   Anion gap 5 5 - 15  CBC  Result Value Ref Range   WBC 5.1 4.0 - 10.5 K/uL   RBC 4.42 3.87 - 5.11 MIL/uL   Hemoglobin 13.5 12.0 - 15.0 g/dL   HCT 40.5 36.0 - 46.0 %   MCV 91.6 78.0 - 100.0 fL   MCH 30.5 26.0 - 34.0 pg   MCHC 33.3 30.0 - 36.0 g/dL   RDW 12.3 11.5 - 15.5 %   Platelets 281 150 - 400 K/uL  Urinalysis, Routine w reflex microscopic (not at [facility])  Result Value Ref Range   Color, Urine YELLOW YELLOW   APPearance CLEAR CLEAR   Specific Gravity, Urine 1.018 1.005 - 1.030   pH 7.5 5.0 - 8.0   Glucose, UA NEGATIVE NEGATIVE mg/dL   Hgb urine dipstick NEGATIVE NEGATIVE   Bilirubin Urine NEGATIVE NEGATIVE   Ketones, ur NEGATIVE NEGATIVE mg/dL   Protein, ur NEGATIVE NEGATIVE mg/dL   Urobilinogen, UA 0.2 0.0 - 1.0 mg/dL   Nitrite NEGATIVE NEGATIVE   Leukocytes, UA TRACE (A) NEGATIVE  Urine microscopic-add on  Result Value Ref Range   Squamous Epithelial / LPF RARE RARE   WBC, UA 0-2 <3 WBC/hpf  I-Stat beta hCG blood, ED ([facility], [facility], [facility] only)  Result Value Ref Range   I-stat hCG, quantitative <5.0 <5 mIU/mL    Comment 3           US Transvaginal Non-ob 10/28/2014   CLINICAL DATA:  Right-sided pelvic pain 3 months. Evaluate for ovarian torsion. History of endometriosis.  EXAM: TRANSABDOMINAL AND TRANSVAGINAL ULTRASOUND OF PELVIS  DOPPLER ULTRASOUND OF OVARIES  TECHNIQUE: Both transabdominal and transvaginal ultrasound examinations of the pelvis were performed. Transabdominal technique was performed for global imaging of the pelvis including uterus, ovaries, adnexal regions, and pelvic cul-de-sac.  It was necessary to proceed with endovaginal exam following the transabdominal exam to visualize the endometrium and ovaries. Color and duplex Doppler ultrasound was utilized to evaluate blood flow to the ovaries.  COMPARISON:  02/28/2013  FINDINGS: Uterus  Measurements: 3.4 x 4.9 x 7.1 cm. No fibroids or other mass visualized. Several small nabothian cysts are present.  Endometrium  Thickness: 8 mm. 4 mm rounded cyst projected over the fundal endometrium likely parametrial cyst.  Right ovary  Measurements: 2.0 x 3.1 x 4.4 cm. Normal appearance/no adnexal mass. 2.5 cm dominant follicle.  Left ovary  Measurements: 2.0 x 3.3 x 3.5 cm. Normal appearance/no adnexal mass. 1.2 cm left paraovarian cyst.  Pulsed Doppler evaluation of both ovaries demonstrates normal low-resistance arterial and venous waveforms.  Other findings  Trace free pelvic fluid.  IMPRESSION: Unremarkable ovaries without evidence of torsion.  4 mm parametrial cyst, otherwise unremarkable uterus and endometrium.  1.2 cm left para ovarian cyst.   Electronically Signed   By: Marin Olp M.D.   On: 10/28/2014 14:22    Ct Abdomen Pelvis W Contrast 10/28/2014   CLINICAL DATA:  Right lower quadrant abdominal pain for  3 months.  EXAM: CT ABDOMEN AND PELVIS WITH CONTRAST  TECHNIQUE: Multidetector CT imaging of the abdomen and pelvis was performed using the standard protocol following bolus administration of intravenous contrast.  CONTRAST:  53mL OMNIPAQUE IOHEXOL  300 MG/ML SOLN, 138mL OMNIPAQUE IOHEXOL 300 MG/ML SOLN  COMPARISON:  None available currently.  FINDINGS: Visualized lung bases appear normal. No significant osseous abnormality is noted.  No gallstones are noted. The liver, spleen and pancreas appear normal. Adrenal glands and kidneys appear normal. No hydronephrosis or renal obstruction is noted. The appendix appears normal. Uterus and ovaries are unremarkable there is no evidence of bowel obstruction. No abnormal fluid collection is noted. Urinary bladder appears normal. No significant adenopathy is noted. There is the suggestion of mild wall thickening involving the ileocecal valve which could represent inflammation related to inflammatory bowel disease. This is best visualized on image number 35 of series 5.  IMPRESSION: The appendix appears normal. There is possible mild wall thickening involving the ileocecal valve which could represent inflammation related to inflammatory bowel disease. No other significant abnormality is noted in the abdomen or pelvis.   Electronically Signed   By: Marijo Conception, M.D.   On: 10/28/2014 16:00    1605:  Pt refuses pelvic exam. [place] reassuring. CT scan as above, will tx abx. Pt wants to go home now. Dx and testing d/w pt.  Questions answered.  Verb understanding, agreeable to d/c home with outpt f/u.    Francine Graven, DO 11/02/14 1249

## 2014-10-28 NOTE — ED Notes (Signed)
Pt is a&ox4 and denied questions, concerns r/t dc. Pt is ambulatory

## 2014-10-28 NOTE — ED Notes (Signed)
Patient transported to Ultrasound 

## 2014-10-28 NOTE — ED Notes (Signed)
Patient states about 3 months ago she began having RLQ abd pain when walking. Pain is intermittent, does not bother her when sitting. Pain onset this episode 2 days ago. Patient c/o intermittent nausea, none now. Last BM 10/28/2014 am.

## 2014-10-28 NOTE — Discharge Instructions (Signed)
°Emergency Department Resource Guide °1) Find a Doctor and Pay Out of Pocket °Although you won't have to find out who is covered by your insurance plan, it is a good idea to ask around and get recommendations. You will then need to call the office and see if the doctor you have chosen will accept you as a new patient and what types of options they offer for patients who are self-pay. Some doctors offer discounts or will set up payment plans for their patients who do not have insurance, but you will need to ask so you aren't surprised when you get to your appointment. ° °2) Contact Your Local Health Department °Not all health departments have doctors that can see patients for sick visits, but many do, so it is worth a call to see if yours does. If you don't know where your local health department is, you can check in your phone book. The CDC also has a tool to help you locate your state's health department, and many state websites also have listings of all of their local health departments. ° °3) Find a Walk-in Clinic °If your illness is not likely to be very severe or complicated, you may want to try a walk in clinic. These are popping up all over the country in pharmacies, drugstores, and shopping centers. They're usually staffed by nurse practitioners or physician assistants that have been trained to treat common illnesses and complaints. They're usually fairly quick and inexpensive. However, if you have serious medical issues or chronic medical problems, these are probably not your best option. ° °No Primary Care Doctor: °- Call [facility] at  832-8000 - they can help you locate a primary care doctor that  accepts your insurance, provides certain services, etc. °- Physician Referral Service- 1-800-533-3463 ° °Chronic Pain Problems: °Organization         Address  Phone   Notes  °[facility]  (336) 297-2271 Patients need to be referred by their primary care doctor.  ° °Medication  Assistance: °Organization         Address  Phone   Notes  °[place] [facility] [address] °[place], [place] [address] (336) 641-8030 --Must be a resident of [place] °-- Must have NO insurance coverage whatsoever (no Medicaid/ Medicare, etc.) °-- The pt. MUST have a primary care doctor that directs their care regularly and follows them in the community °  °[facility]  (866) 331-1348   °[employer]  (888) 892-1162   ° °Agencies that provide inexpensive medical care: °Organization         Address  Phone   Notes  °[facility]  (336) 832-8035   °[facility]    (336) 832-7272   °[facility] [address] °[place], [place] [address] (336) 832-4777   °[facility] [address], °[place] (336) 271-4999   °[facility]    (336) 373-0678   °[facility]    (336) 272-1050   °[facility] ° [address], [place] Phone:  (336) 832-4444, Fax:  (336) 832-4440 Hours of Operation:  9 am - 6 pm, M-F.  Also accepts Medicaid/Medicare and self-pay.  °[facility] ° [address],  Phone: (336) 832-3150, Fax: (336) 832-3151. Hours of Operation:  8:30 am - 5:30 pm, M-F.  Also accepts Medicaid and self-pay.  °[facility] [place] [address], [place] Phone: (336) 878-6027   °[facility] [address], [place], [place] (336)723-1848, Ext. 123 Mondays & Thursdays: 7-9 AM.  First 15 patients are seen on a first come, first serve basis. °  ° °Medicaid-accepting [place] Providers: ° °Organization         Address  Phone   Notes  °[facility] [address], [place] (336) 641-2100 Also accepts self-pay patients.  °[facility] [address], [place] ° (336) 856-9996   °[facility] [address], [place]  (336) 288-8857   °[facility] [address] [place] [address], [place] (336) 299-7000   °Veita Bland [address], [place]  ° (336) 373-1557 Only accepts [place] Access Medicaid patients after they have their name applied to their card.  ° °Self-Pay (no insurance) in [place]: ° °Organization         Address  Phone   Notes  °Sickle Cell Patients, [facility] [address], [place] (336) 832-1970   °[facility] [address], [place] (336) 832-4400   ° [facility] ° [address], [place] (336) 992-4800   °[facility]/Dr. Osei-Bonsu ° [address] [place] [address], [place] or [address], [place] (336) 841-8500 Phone number for both [place] and [place] locations is the same.  °[facility] [address], [place] (336) 299-0000   °[facility] [address] [place] [address], [place] or [address] (336) 852-7530 °(336) 878-2260   °[facility] [address], [place] (336) 350-1642, phone; (336) 294-5005, fax Sees patients 1st and 3rd Saturday of every month.  Must not qualify for public or private insurance (i.e. Medicaid, Medicare, [place] Health Choice, Veterans' Benefits) • Household income should be no more than 200% of the poverty level •The clinic cannot treat you if you are pregnant or think you are pregnant • Sexually transmitted diseases are not treated at the clinic.  ° ° °Dental Care: °Organization         Address  Phone  Notes  °[place] [facility] [address],  (336) 641-6152 Accepts children up to age 21 who are enrolled in Medicaid or [place] Health Choice; pregnant women with a Medicaid card; and children who have applied for Medicaid or [place] Health Choice, but were declined, whose parents can pay a reduced fee at time of service.  °[place]  [facility] [place]  [address], [place] (336) 641-7733 Accepts children up to age 21 who are enrolled in Medicaid or [place] Health Choice; pregnant women with a Medicaid card; and children who have applied for Medicaid or [place] Health Choice, but were declined, whose parents can pay a reduced fee at time of service.  °[facility] ° [address],  (336) 641-4533 Patients are seen by appointment only. Walk-ins are not accepted. [facility] will see patients 18 years of age and older. °Monday - Tuesday (8am-5pm) °Most Wednesdays (8:30-5pm) °$30 per visit, cash only  °[facility] ° [address], [place] (336) 641-4533 Patients are seen by appointment only. Walk-ins are not accepted. [facility] will see patients 18 years of age and older. °One   Wednesday Evening (Monthly: Volunteer Based).  $30 per visit, cash only  °[facility]  (919) 537-3737 for adults; Children under age 4, call Graduate Pediatric Dentistry at (919) [phone]. Children aged 4-14, please call (919) [phone] to request a pediatric application. ° Dental services are provided in all areas of dental care including fillings, crowns and bridges, complete and partial dentures, implants, gum treatment, root canals, and extractions. Preventive care is also provided. Treatment is provided to both adults and children. °Patients are selected via a lottery and there is often a waiting list. °  °[facility] [address], °[place] ° (336) 763-8833 www.drcivils.com °  °[facility] [address], [place], [place] (336)723-1848, Ext. 123 Second and Fourth Thursday of each month, opens at 6:30 AM; Clinic ends at 9 AM.  Patients are seen on a first-come first-served basis, and a limited number are seen during each clinic.  ° °[facility] ° [address], [place], [place] (336) 723-7904    Eligibility Requirements °You must have lived in [place], [place], or [place] counties for at least the last three months. °  You cannot be eligible for state or federal sponsored healthcare insurance, including Veterans Administration, Medicaid, or Medicare. °  You generally cannot be eligible for healthcare insurance through your employer.  °  How to apply: °Eligibility screenings are held every Tuesday and Wednesday afternoon from 1:00 pm until 4:00 pm. You do not need an appointment for the interview!  °[facility] [address], [place], [place] 336-631-2330   °[facility]  336-342-8273   °[facility]  336-703-3100   °[facility]  336-570-6415   ° °Behavioral Health Resources in the Community: °Intensive Outpatient Programs °Organization         Address  Phone  Notes  °[facility] [address], [place], [place] 336-878-6098   °[facility] [address], [place], [place] 336-832-9800   °[facility] [address], [place], [place] ° 336-882-2125   °[facility] [address],  °[place], [place] 1-800-853-5163 or 336-641-4981   °Substance Abuse Resources °Organization         Address  Phone  Notes  °[facility]  336-882-2125   °[facility]  336-784-9470   °[facility]  336-285-9073   °[facility]  336-845-3988   °Residential & Outpatient Substance Abuse Program  1-800-659-3381   °Psychological Services °Organization         Address  Phone  Notes  ° Health  336- 832-9600   °[facility]  336- 378-7881   °[facility] [address], [place] 1-800-853-5163 or 336-641-4981   ° °Mobile Crisis Teams °Organization         Address  Phone  Notes  °[facility], Mobile Crisis Care Unit  1-877-626-1772   °Assertive °[facility] ° [address]  [place], [place] 336-834-9664   °Sharon DeEsch [address] °[place] 336-554-5454   ° °Self-Help/Support Groups °Organization         Address  Phone             Notes  °[facility] - variety of support groups  336- 373-1402 Call for more information  °Narcotics Anonymous (NA), [facility] [address], °[place]  2 meetings at this location  ° °  Residential Treatment Programs Organization         Address  Phone  Notes  [facility] [address]    [place]  1-(737)383-8613   [facility]  [address], [place] [address], [place], [place]   [place], [place] [phone] Admissions: 8am-3pm M-F  [facility] [address],    [place], [place] 811-572-6203   [facility] [address] [place], [place], [place]   [facility] [address]  [place], [place]   [facility] - Intensive Outpatient [address], [place], [place]   [facility] ([place]  [place], [place] 1-575-250-5853 or 367-839-4240   [facility] [address] [place], [place] Accepts Medicaid  [facility] [address],  [place] 1-986-503-9445 Substance Abuse/Addiction Treatment   [facility]         Address  Phone  Notes  [facility]  (601) 297-0652   Domenic Schwab, PhD [address] [place], [place]   (802)453-9490 or (574)009-0013   [place], [place] [phone]   [facility] [address] [place], [place] [phone] Insurance/Medicaid/sponsorship through [facility] [address], [address] [place]                                    [place], [place] [phone] [place] [address], [place] [phone]    Dr. Adele Schilder  ([phone]) [phone]   [facility] Dept. 1) [address], [place] 2) [place] 3)  [address], [place] (250) [phone]) [phone]  [phone]   [place] 7817322848 or [phone] (After Hours)      Take the prescriptions as directed.  Call your regular medical doctor and the GI doctor today to schedule a follow up appointment within the next week.  Return to the Emergency Department immediately sooner if worsening.

## 2014-11-10 ENCOUNTER — Encounter: Payer: Self-pay | Admitting: Gynecology

## 2014-11-10 ENCOUNTER — Telehealth: Payer: Self-pay | Admitting: General Practice

## 2014-11-10 ENCOUNTER — Ambulatory Visit (INDEPENDENT_AMBULATORY_CARE_PROVIDER_SITE_OTHER): Payer: BLUE CROSS/BLUE SHIELD | Admitting: Gynecology

## 2014-11-10 VITALS — BP 128/78

## 2014-11-10 DIAGNOSIS — L659 Nonscarring hair loss, unspecified: Secondary | ICD-10-CM

## 2014-11-10 DIAGNOSIS — L708 Other acne: Secondary | ICD-10-CM

## 2014-11-10 DIAGNOSIS — M6289 Other specified disorders of muscle: Secondary | ICD-10-CM | POA: Diagnosis not present

## 2014-11-10 DIAGNOSIS — R0602 Shortness of breath: Secondary | ICD-10-CM | POA: Diagnosis not present

## 2014-11-10 DIAGNOSIS — R61 Generalized hyperhidrosis: Secondary | ICD-10-CM

## 2014-11-10 DIAGNOSIS — R002 Palpitations: Secondary | ICD-10-CM

## 2014-11-10 DIAGNOSIS — G4700 Insomnia, unspecified: Secondary | ICD-10-CM | POA: Diagnosis not present

## 2014-11-10 DIAGNOSIS — N912 Amenorrhea, unspecified: Secondary | ICD-10-CM

## 2014-11-10 LAB — PREGNANCY, URINE: Preg Test, Ur: NEGATIVE

## 2014-11-10 LAB — HEMOGLOBIN A1C
HEMOGLOBIN A1C: 5.3 % (ref <5.7)
MEAN PLASMA GLUCOSE: 105 mg/dL (ref <117)

## 2014-11-10 LAB — THYROID PANEL WITH TSH
FREE THYROXINE INDEX: 2.3 (ref 1.4–3.8)
T3 UPTAKE: 32 % (ref 22–35)
T4 TOTAL: 7.1 ug/dL (ref 4.5–12.0)
TSH: 3.048 u[IU]/mL (ref 0.350–4.500)

## 2014-11-10 MED ORDER — MEDROXYPROGESTERONE ACETATE 10 MG PO TABS: ORAL_TABLET | ORAL | Status: DC

## 2014-11-10 NOTE — Telephone Encounter (Signed)
Please arrange a OV  at the earliest patient's convenience

## 2014-11-10 NOTE — Telephone Encounter (Signed)
Caller name: Earnest Bailey  Relation to pt: CMA from [facility] Dr. Toney Rakes  Call back number: [phone]    Reason for call:  Dr. Toney Rakes referred patient to establish care with you. Please advise

## 2014-11-10 NOTE — Progress Notes (Signed)
   Patient is a 29 year old who was last seen in the office in August of this year for her annual exam. Patient presented to the office today with multiple complaints today as follows:  1. She reports being late on her menses for 5 days and started spotting today 2. Patient states that the evenings she has night sweats and during the day she feels chills 3. Patiient complains of palpitations 4. Patient complaints of shortness of breath sometimes at night 5. Patient complaining of hair loss 6. Acne 7. mood swings 8. Headaches  Patient is not using any form of contraception has a history in 2015 of having had a second trimester miscarriage at [redacted] weeks gestation. She had been at bedrest since 16 weeks secondary to preterm premature ruptured membranes. She continued to bleed was taken back to the operating room for a D&C was performed believing there was probably retained products of conception and she has continued to bleed. She had been placed on antibiotic for suspected chorioamnionitis leading to her preterm premature rupture membranes. She stated also says she was tested and was positive for group B strep. They had tested her for von Willebrand along with CBC and PT and PTT as well as Leiden V Factor, as well as antiphospholipid antibodies IgG and IgA all studies were normal.Patient had a normal ultrasound and sonohysterogram in 2015. In September 2015 patient had a left lumpectomy for fibroadenoma.  Approximately 2 weeks ago she went to the emergency room with right lower quadrant discomfort had extensive evaluation consisting of a normal pelvic ultrasound and CT scan. She also had a normal CBC, comprehensive metabolic panel and negative urine pregnancy test.  Exam: Gen. appearance well-developed well-nourished female in no acute distress HEENT unremarkable Lungs clear to auscultation Rogers or wheezes Heart regular rate and rhythm no murmurs or gallops Abdomen: Soft nontender no rebound or  guarding Pelvic exam: Bartholin urethra Skene was within normal limits Vagina: Menstrual blood present Cervix: Menstrual blood present Uterus: Anteverted normal size shape and consistency Adnexa: No palpable masses or tenderness Rectal exam: Not done  Urine pregnancy test negative  Assessment/plan: Patient with secondary amenorrhea currently menstruating occasionally experiences episodes of amenorrhea probably anovulatory. I'm point prescribed Provera 10 mg for her to take 1 daily for 10 days of the month if she does not have a spontaneous menses every 30 days providing that she does a urine pregnancy test first. We are going to check a hemoglobin A1c along with a full thyroid panel and prolactin level because of her symptoms. I'm going to refer her to one of my internal medicine colleagues for further evaluation if these tests are normal as a result of the above-mentioned symptoms. She does have prescription for Xanax 0.25 mg that she can take when necessary anxiety but only during her menses since she is attempting to get pregnant and not using any form of contraception.

## 2014-11-10 NOTE — Telephone Encounter (Signed)
LVM for [facility] to c/b regarding scheduling patient.

## 2014-11-11 LAB — VITAMIN D 25 HYDROXY (VIT D DEFICIENCY, FRACTURES): VIT D 25 HYDROXY: 33 ng/mL (ref 30–100)

## 2014-11-11 LAB — PROLACTIN: Prolactin: 20.4 ng/mL

## 2014-11-14 ENCOUNTER — Ambulatory Visit (INDEPENDENT_AMBULATORY_CARE_PROVIDER_SITE_OTHER): Payer: BLUE CROSS/BLUE SHIELD | Admitting: Internal Medicine

## 2014-11-14 ENCOUNTER — Encounter: Payer: Self-pay | Admitting: Internal Medicine

## 2014-11-14 VITALS — BP 102/74 | HR 72 | Temp 98.2°F | Ht 63.5 in | Wt 148.2 lb

## 2014-11-14 DIAGNOSIS — R51 Headache: Secondary | ICD-10-CM | POA: Diagnosis not present

## 2014-11-14 DIAGNOSIS — R0602 Shortness of breath: Secondary | ICD-10-CM | POA: Diagnosis not present

## 2014-11-14 DIAGNOSIS — R6889 Other general symptoms and signs: Secondary | ICD-10-CM

## 2014-11-14 DIAGNOSIS — R519 Headache, unspecified: Secondary | ICD-10-CM

## 2014-11-14 DIAGNOSIS — L659 Nonscarring hair loss, unspecified: Secondary | ICD-10-CM

## 2014-11-14 LAB — VITAMIN B12: Vitamin B-12: 680 pg/mL (ref 211–911)

## 2014-11-14 LAB — FOLATE: FOLATE: 9.6 ng/mL (ref >5.9)

## 2014-11-14 LAB — SEDIMENTATION RATE: Sed Rate: 14 mm/hr (ref 0–22)

## 2014-11-14 NOTE — Progress Notes (Signed)
Subjective:    Patient ID: Monica Phelps, female    DOB: 03/10/1985, 29 y.o.   MRN: [id number]  DOS:  11/14/2014 Type of visit - description : New patient Interval history: Here at the request of her gynecologist. The patient had a miscarriage 12-2012, since then, had a number of problems with bleeding, eventually that  improved after she was treated by Dr. Toney Rakes. For the last 2 years has experienced numerous  symptoms on and off: Hair loss, cold intolerance, moderate frontal headache sporadically, sometimes SOB at night, sometimes have bilateral lower extremity edema. Recent labs reviewed, they were unrevealing.   Review of Systems She remains active, doing mostly lifting at the gym, no cardio. No fever, chills, no weight loss Denies chest pain, + a sporadic palpitations. No DOE with normal activities No nausea, vomiting, diarrhea or constipation. No cough or wheezing Denies fatigue, anxiety or depression   Past Medical History  Diagnosis Date  . Reflux   . UTI (lower urinary tract infection)   . Constipation   . Gastritis   . Fibroid   . History of bleeding disorder as a child   . Asthma   . Headache(784.0)     migraines  . GERD (gastroesophageal reflux disease)   . Anxiety     paniac  . Blood dyscrasia     pt states hemorrhaged with last period 12/14 and was hospitalized in [place]  . Endometriosis   . Complication of anesthesia   . PONV (postoperative nausea and vomiting)     Past Surgical History  Procedure Laterality Date  . Dilation and curettage of uterus    . Induced abortion      at 5 months fetal anomalies hemorrhaged afterwards  . Dilation and curettage of uterus N/A 03/03/2013    Procedure: DILATATION AND CURETTAGE;  Surgeon: Emily Filbert, MD;  Location: [facility];  Service: Gynecology;  Laterality: N/A;  . Laparoscopy N/A 03/03/2013    Procedure: LAPAROSCOPY DIAGNOSTIC;  Surgeon: Emily Filbert, MD;  Location: [facility];  Service: Gynecology;  Laterality:  N/A;  . Laparoscopic lysis of adhesions N/A 03/03/2013    Procedure: LAPAROSCOPIC LYSIS OF ADHESIONS;  Surgeon: Emily Filbert, MD;  Location: [facility];  Service: Gynecology;  Laterality: N/A;  . Mass excision Left 10/27/2013    Procedure: EXCISION  LEFT BREAST MASS;  Surgeon: Stark Klein, MD;  Location: [facility];  Service: General;  Laterality: Left;    Social History   Social History  . Marital Status: Married    Spouse Name: N/A  . Number of Children: 0  . Years of Education: N/A   Occupational History  . stay home    Social History Main Topics  . Smoking status: Never Smoker   . Smokeless tobacco: Never Used  . Alcohol Use: No  . Drug Use: No  . Sexual Activity:    Partners: Male    Birth Control/ Protection: Condom     Comment: one week ago   Other Topics Concern  . Not on file   Social History Narrative   Original from [place]     Family History  Problem Relation Age of Onset  . Alcohol abuse Neg Hx   . Arthritis Neg Hx   . Asthma Neg Hx   . Birth defects Neg Hx   . Cancer Neg Hx   . Depression Neg Hx   . COPD Neg Hx   . Diabetes Neg Hx   .  Drug abuse Neg Hx   . Early death Neg Hx   . Hearing loss Neg Hx   . Kidney disease Neg Hx   . Learning disabilities Neg Hx   . Mental illness Neg Hx   . Mental retardation Neg Hx   . Miscarriages / Stillbirths Neg Hx   . Vision loss Neg Hx   . Hypertension Mother   . Heart disease Mother   . Stroke Mother   . Hyperlipidemia Father   . Hypothyroidism Other     several fam members       Medication List       This list is accurate as of: 11/14/14  5:22 PM.  Always use your most recent med list.               CALCIUM PO  Take 1 tablet by mouth daily.     naproxen 250 MG tablet  Commonly known as:  NAPROSYN  Take 1 tablet (250 mg total) by mouth 2 (two) times daily as needed for mild pain or moderate pain (take with food).     VITAMIN A PO  Take 1 tablet by mouth daily.     VITAMIN C  PO  Take 1 tablet by mouth daily.     VITAMIN D PO  Take 1 tablet by mouth daily.     VITAMIN E PO  Take 1 tablet by mouth daily.           Objective:   Physical Exam BP 102/74 mmHg  Pulse 72  Temp(Src) 98.2 F (36.8 C) (Oral)  Ht 5' 3.5" (1.613 m)  Wt 148 lb 4 oz (67.246 kg)  BMI 25.85 kg/m2  SpO2 97%  LMP 09/30/2014 General:   Well developed, well nourished . NAD. Healthy appearing HEENT:  Normocephalic . Face symmetric, atraumatic. Neck: No thyromegaly. No JVD at 45 Lungs:  CTA B Normal respiratory effort, no intercostal retractions, no accessory muscle use. Heart: RRR,  no murmur.  no pretibial edema bilaterally  Abdomen:  Not distended, soft, non-tender. No rebound or rigidity. No mass,organomegaly Skin: Not pale. Not jaundice. Hair looks very healthy. Neurologic:  alert & oriented X3.  Speech normal, gait appropriate for age and unassisted Psych--  Cognition and judgment appear intact.  Cooperative with normal attention span and concentration.  Behavior appropriate. No anxious or depressed appearing.    Assessment & Plan:   Assessment > Constipation  G1P0 12-2012  H/o Endometriosis , Uterine fibroids excessive bleeding? H/o Anxiety, panic attacks (rarely) H/o Asthma - remote as a child  Plan Multiple symptoms: Hair loss, cold intolerance,  headaches, SOB, lower extremity edema occasionally, hair loss. Exam and labs are reassuring, no evidence of depression, fluid overload, anemia, thyroid disease, sleep apnea. EKG today: Normal sinus rhythm D/t daily headaches will get a sedimentation rate, will also get vitamin D, folic acid and A70 levels. Otherwise recommend observation, return to the office 6 months.

## 2014-11-14 NOTE — Patient Instructions (Signed)
Get your blood work before you leave     Next visit  for a   follow-up in 6 months   (15 minutes) Please schedule an appointment at the front desk

## 2014-11-14 NOTE — Progress Notes (Signed)
Pre visit review using our clinic review tool, if applicable. No additional management support is needed unless otherwise documented below in the visit note. 

## 2014-11-16 LAB — VITAMIN D 1,25 DIHYDROXY
VITAMIN D 1, 25 (OH) TOTAL: 57 pg/mL (ref 18–72)
Vitamin D2 1, 25 (OH)2: <8 pg/mL
Vitamin D3 1, 25 (OH)2: 57 pg/mL

## 2015-06-22 ENCOUNTER — Ambulatory Visit: Payer: BLUE CROSS/BLUE SHIELD | Admitting: Gynecology

## 2015-06-27 ENCOUNTER — Ambulatory Visit: Payer: BLUE CROSS/BLUE SHIELD | Admitting: Gynecology

## 2015-07-06 ENCOUNTER — Ambulatory Visit (INDEPENDENT_AMBULATORY_CARE_PROVIDER_SITE_OTHER): Payer: BLUE CROSS/BLUE SHIELD | Admitting: Gynecology

## 2015-07-06 ENCOUNTER — Telehealth: Payer: Self-pay | Admitting: *Deleted

## 2015-07-06 ENCOUNTER — Encounter: Payer: Self-pay | Admitting: Gynecology

## 2015-07-06 VITALS — BP 124/78

## 2015-07-06 DIAGNOSIS — N644 Mastodynia: Secondary | ICD-10-CM | POA: Diagnosis not present

## 2015-07-06 DIAGNOSIS — N940 Mittelschmerz: Secondary | ICD-10-CM

## 2015-07-06 DIAGNOSIS — N938 Other specified abnormal uterine and vaginal bleeding: Secondary | ICD-10-CM | POA: Diagnosis not present

## 2015-07-06 DIAGNOSIS — R350 Frequency of micturition: Secondary | ICD-10-CM | POA: Diagnosis not present

## 2015-07-06 DIAGNOSIS — F4322 Adjustment disorder with anxiety: Secondary | ICD-10-CM | POA: Diagnosis not present

## 2015-07-06 DIAGNOSIS — L680 Hirsutism: Secondary | ICD-10-CM

## 2015-07-06 MED ORDER — SPIRONOLACTONE 25 MG PO TABS: 25.0000 mg | ORAL_TABLET | Freq: Every day | ORAL | Status: DC

## 2015-07-06 MED ORDER — ALPRAZOLAM 0.25 MG PO TABS: 0.2500 mg | ORAL_TABLET | Freq: Every evening | ORAL | Status: DC | PRN

## 2015-07-06 NOTE — Patient Instructions (Signed)
Sensibilidad en las mamas (Breast Tenderness) La sensibilidad en las mamas es un problema frecuente en las mujeres de todas las edades. y puede causar molestias leves o dolor intenso. Sus causas son variadas. Su mdico determinar la causa probable de la sensibilidad Civil Service fast streamer examen de las [place], las preguntas [employer] sntomas y la indicacin de algunos estudios. Por lo general, la sensibilidad en las mamas no significa que tenga cncer de mama. INSTRUCCIONES PARA EL CUIDADO EN EL HOGAR  A menudo, la sensibilidad en las mamas puede tratarse en Engineer, mining. Puede intentar lo siguiente:  Probarse un nuevo sostn que le brinde ms sujecin, especialmente mientras hace actividad fsica.  Usar un sostn con mejor sujecin o uno deportivo mientras duerme cuando las mamas estn muy sensibles.  Si tiene una lesin mamaria, aplique hielo en la zona:  Ponga el hielo en una bolsa plstica.  Colquese una toalla entre la piel y la bolsa de hielo.  Deje el hielo durante 20 minutos y aplquelo 2 a 3 veces por da.  Si tiene las [place] [employer] [place] debido a la Transport planner, intente lo siguiente:  Extrigase Dana Corporation o con un sacaleche.  Aplquese una compresa tibia en las mamas para ayudar a Transport planner.  Tome analgsicos de venta libre si su mdico lo autoriza.  Tome otros medicamentos que su mdico le recete, entre ellos, antibiticos o anticonceptivos. A largo plazo, puede aliviar la sensibilidad en las mamas si hace lo siguiente:  Disminuye el consumo de cafena.  Disminuye la cantidad de grasa de la dieta. Lleva un registro de los das y las horas cuando tiene mayor sensibilidad en las [place]. Esto ser de ayuda para que usted y su mdico encuentren la causa de la sensibilidad y cmo Solicitor. Adems, aprenda cmo examinarse las mamas en casa. Esto la ayudar a palpar un crecimiento o un bulto fuera de lo normal que podra causar la sensibilidad. SOLICITE ATENCIN MDICA SI:    Cualquier zona de la mama est dura, enrojecida y caliente al tacto. Puede ser un signo de infeccin.  Hay secrecin de los pezones (y no est amamantando). En especial, vigile la secrecin de sangre o pus.  Tiene fiebre, adems de sensibilidad en las mamas.  Tiene un bulto nuevo o doloroso en la mama que no desaparece despus de la finalizacin del perodo menstrual.  Ha intentando controlar el dolor en casa, pero no desaparece.  El dolor de la mama es ms intenso o le dificulta hacer las cosas que hace habitualmente durante el da.   Esta informacin no tiene Marine scientist el consejo del mdico. Asegrese de hacerle al mdico cualquier pregunta que tenga.   Document Released: 11/18/2012 Elsevier Interactive Patient Education 2016 [place] hiperactiva en adultos (Overactive Bladder, Adult) El trastorno de vejiga hiperactiva es un grupo de sntomas urinarios. Si tiene vejiga hiperactiva, puede sentir la necesidad repentina de orinar de inmediato. Despus de sentir esta necesidad urgente, tambin puede tener prdida de orina si no puede llegar al bao con la rapidez suficiente (incontinencia urinaria). Estos sntomas pueden interferir con su trabajo diario y las actividades sociales. Adems, los sntomas de vejiga hiperactiva pueden despertarlo durante la noche. El trastorno de vejiga hiperactiva afecta las seales nerviosas entre la vejiga y el cerebro. La vejiga puede recibir la seal de vaciarse antes de que est llena. Los msculos muy sensibles tambin pueden hacer que pierda orina demasiado pronto. CAUSAS Las causas de la vejiga hiperactiva pueden ser varias: Las causas posibles  son las siguientes:  Infeccin urinaria.  Infeccin de los tejidos cercanos, como la prstata.  Agrandamiento de la prstata.  Estar embarazada de ms de un beb (embarazo mltiple).  Ciruga en el tero o la uretra.  Clculos en la vejiga, inflamacin o tumores.  Consumir  cafena o alcohol en exceso.  Ciertos medicamentos, en especial lo que se toman para ayudar al organismo a eliminar el lquido extra (diurticos) al aumentar la produccin de [place].  Debilidad de los msculos y nervios, especialmente a causa de lo siguiente:  Lesin en la mdula espinal.  Ictus.  Esclerosis mltiple.  La enfermedad de Parkinson.  Diabetes. Esto puede producir un volumen de orina elevado que llena la vejiga tan rpido que la necesidad urgente de Garment/textile technologist se desencadena en forma muy intensa.  Estreimiento. La acumulacin de demasiada cantidad de heces puede ejercer presin en la vejiga. FACTORES DE RIESGO Puede correr un mayor riesgo de desarrollar vejiga hiperactiva si usted:  Es Media planner.  Fuma.  Est atravesando la menopausia.  Tiene problemas de prstata.  Tiene una enfermedad neurolgica, como ictus, demencia, enfermedad de Parkinson o esclerosis mltiple (EM).  Ingiere alimentos o bebidas que irritan la vejiga. Entre ellos se incluyen el alcohol, los alimentos picantes y la cafena.  Tiene sobrepeso o es obeso. SIGNOS Y SNTOMAS  [employer] signos y los sntomas de vejiga hiperactiva se incluyen los siguientes:  Urgencia repentina e intensa de Garment/textile technologist.  Prdida de [place].  Orinar ocho o ms veces por da.  Despertarse dos o ms veces durante la noche para Garment/textile technologist. DIAGNSTICO El mdico puede sospechar la presencia de vejiga hiperactiva en funcin de los sntomas que [place]. El [employer]om har un examen fsico y revisar su historia clnica. Pueden realizarle anlisis de [place] o de [place]. Por ejemplo, puede ser necesario realizar pruebas de la funcin de la vejiga para controlar la retencin de [place]. Es posible que tambin tenga que consultar a un mdico especialista en vas urinarias (urlogo). [place] para el trastorno de vejiga hiperactiva depende de la causa y la gravedad de su enfermedad. Ciertos tratamientos pueden  Associate Professor del mdico o en la clnica. Tambin puede hacer cambios en su estilo de vida en su casa. [place] opciones se incluyen las siguientes: Tratamientos Statistician. El especialista utiliza sensores para ayudarlo a Personnel officer atento a las seales del cuerpo.  Llevar un registro diario de los momentos en que necesita orinar y qu sucede despus de la necesidad urgente de Garment/textile technologist. Esto puede ayudarlo a Electrical engineer.  Entrenamiento de la vejiga. Esto lo ayuda a aprender a Aeronautical engineer necesidad urgente de Garment/textile technologist al seguir un programa que lo obliga a Garment/textile technologist en intervalos regulares (vaciamiento cronometrado). Al principio, es posible que tenga que esperar unos minutos despus de sentir la necesidad urgente de Garment/textile technologist. Con el tiempo, debera poder [employer] con una hora de diferencia o ms.  Ejercicios de Kegel. Son ejercicios para fortalecer los msculos del piso plvico que sostienen la vejiga. La tonificacin de estos msculos puede ayudarlo a Chief Technology Officer las micciones, aun si hay hiperactividad en los msculos de la vejiga. Un especialista le ensear cmo hacer estos ejercicios en forma correcta. Deber practicarlos diariamente.  Prdida de peso. Si es obeso o tiene sobrepeso, perder [employer] sntomas de vejiga hiperactiva. Hable con su mdico acerca de cmo perder peso y si hay algn programa o mtodo especfico ms eficaz para usted.  Cambio en  la dieta. Esto podra ayudar si el estreimiento empeora el trastorno de vejiga hiperactiva. El mdico o nutricionista puede explicarle de qu forma puede hacer cambios en su dieta para Theatre stage manager estreimiento. Tambin es posible que tenga que consumir menos cantidad de alcohol y cafena, y beber otros lquidos en distintos momentos del da.  Dejar de fumar.  Usar apsitos para Tax adviser las prdidas mientras espera que otros tratamientos surtan [place]. Tratamientos  fsicos  Estimulacin elctrica. Los electrodos envan pulsos elctricos suaves para [employer] Corporation nervios o los msculos que ayudan a Chief Technology Officer la vejiga. En algunos casos, los electrodos se colocan fuera del cuerpo. En otros casos, pueden colocarse en el interior del cuerpo (implante). Este tratamiento puede demorar varios meses en surtir [employer].  Dispositivos complementarios. Las mujeres pueden necesitar un dispositivo de plstico que calce en la vagina y sostenga la vejiga (pesario). Medicamentos Varios medicamentos pueden ayudar a Chiropodist trastorno de vejiga hiperactiva y por lo general se utilizan junto con otros medicamentos. Algunos se inyectan en los msculos que participan en la miccin. Otros vienen en comprimidos. El mdico tambin puede indicarle lo siguiente:  Antiespasmdicos. Estos medicamentos bloquean las seales que los nervios envan a la vejiga. Esto evita que la vejiga elimine orina en el momento incorrecto.  Antidepresivos tricclicos. Estos tipos de antidepresivos tambin [employer] Scientific de la vejiga. Ciruga  Puede implantarse un dispositivo que ayuda a Chief Technology Officer las seales nerviosas que indican cundo debe orinar.  Puede someterse a una ciruga de implante de electrodos para recibir Ship broker.  A veces, los casos muy graves de vejiga hiperactiva requieren de una ciruga para cambiar la forma de la vejiga. [place] los medicamentos solamente como se lo haya indicado el mdico.  Use los implantes o un pesario como se lo haya indicado el mdico.  Modifique su dieta o estilo de vida como se lo haya recomendado su mdico. Estos pueden incluir los siguientes:  Electronics engineer menos cantidad de lquido o beber en distintos momentos del Training and development officer. Si necesita orinar con frecuencia por las noches, es posible que tenga que dejar de beber lquidos apenas comienza la noche.  Reduzca la ingesta de cafena o alcohol. Ambos  pueden empeorar el trastorno de vejiga hiperactiva. La cafena se encuentra en el caf, el t y los refrescos.  Haga ejercicios de Kegel para fortalecer los msculos.  Pierda peso si lo necesita.  Ingiera una dieta saludable y equilibrada para English as a second language teacher estreimiento.  Lleve un diario o libro de anotaciones para registrar la cantidad de lquidos que ingiere y cundo lo hace, y [place] cundo siente necesidad de Garment/textile technologist. Esto ayudar a su mdico a Administrator, arts. SOLICITE ATENCIN MDICA SI:  Los sntomas no mejoran despus de Chiropodist.  El dolor y [place].  Tiene necesidad urgente de orinar con mayor frecuencia.  Tiene fiebre. SOLICITE ATENCIN MDICA DE INMEDIATO SI: No puede controlar la vejiga para nada.   Esta informacin no tiene Marine scientist el consejo del mdico. Asegrese de hacerle al mdico cualquier pregunta que tenga.   Document Released: 01/15/2012 Document Revised: 02/18/2014 Elsevier Interactive Patient Education 2016 [employer]. Spironolactone tablets Qu es este medicamento? La ESPIRONOLACTONA es un diurtico. Ayuda a incrementar el volumen de [place], lo que provoca que el cuerpo pierda agua que esta en exceso. Este medicamento se [place] para tratar la alta presin sangunea, edema o hinchazn causadas por enfermedades cardacas, hepticas o renales. Se [place]ta [place]  tambin para tratar al A[employer] su organismo produce demasiado aldosterona o que tienen bajos niveles de potasio en la [place]. Este medicamento puede ser utilizado para otros usos; si tiene alguna pregunta consulte con su proveedor de atencin mdica o con su farmacutico. Qu le debo informar a mi profesional de la salud antes de tomar este medicamento? Necesita saber si usted presenta alguno de los [employer] o situaciones: -alto nivel de potasio en sangre -enfermedad renal o dificultad para orinar -enfermedad heptica -una reaccin  alrgica o inusual a la espironolactona, a otros medicamentos, alimentos, colorantes o conservantes -si est embarazada o buscando quedar embarazada -si est amamantando a un beb Cmo debo utilizar este medicamento? Tome este medicamento por va oral con un vaso de agua. Siga las instrucciones de la etiqueta del [place]. Puede tomar este medicamento con o sin alimentos. Si le produce malestar estomacal, tmelo con alimentos. No tome su medicamento con una frecuencia mayor a la indicada. Recuerde que Lexicographer con frecuencia despus de Systems developer. No tome sus dosis en horarios que le causen problemas. No lo tome a la hora de [employer]. Hable con su pediatra para informarse acerca del uso de este medicamento en nios. Aunque este medicamento ha sido recetado para condiciones selectivas, las precauciones se aplican. Sobredosis: Pngase en contacto inmediatamente con un centro toxicolgico o una sala de urgencia si usted cree que haya tomado demasiado medicamento. ATENCIN: [employer] es solo para usted. No comparta este medicamento con nadie. Qu sucede si me olvido de una dosis? Si olvida una dosis, tmela lo antes posible. Si es casi la hora de la prxima dosis, tome slo esa dosis. No tome dosis adicionales o dobles. Qu puede interactuar con este medicamento? No tome esta medicina con ninguno de los siguientes medicamentos: -eplerenona Esta medicina tambin puede interactuar con los siguientes medicamentos: -corticosteroides -digoxina -litio -medicamentos para alta presin sangunea, tales como inhibidores de la ECA -relajantes del msculo esqueltico, tal como tubocurarina -los [place], medicamentos para el dolor o inflamacin, tales como ibuprofeno o naproxeno -productos de potasio, como sucedneos de la sal o suplementos -aminas presoras, como norepinefrina -algunos diurticos Puede ser que esta lista no menciona todas las posibles interacciones. Informe a su  profesional de KB Home	[place] de AES Corporation productos a base de hierbas, medicamentos de [place] o suplementos nutritivos que est tomando. Si usted fuma, consume bebidas alcohlicas o si utiliza drogas ilegales, indqueselo tambin a su profesional de KB Home	[place]. Algunas sustancias pueden interactuar con su medicamento. A qu debo estar atento al usar Coca-Cola? Visite a su mdico o a su profesional de la salud para chequear su evolucin peridicamente. Controle su presin sangunea como indicado. Pregunte a su mdico cul debe ser su presin sangunea y cundo deber comunicarse con l/ella. Puede ser necesario seguir una dieta especial mientras est tomando este medicamento. Consulte a su mdico acerca de esto. Tambin, pregunte a su mdico cunto lquido debe beber [employer]well. No debe deshidratarse. Este medicamento puede hacerle sentirse confundido, mareado o aturdido. El consumo de alcohol o tomar algunos medicamentos puede incrementar estos sntomas. No conduzca ni utilice maquinaria ni haga nada que Associate Professor en estado de alerta hasta que sepa cmo le afecta este medicamento. No se siente ni se ponga de pie con rapidez. Qu efectos secundarios puedo tener al [employer] Corporation este medicamento? Efectos secundarios que debe informar a su mdico o a Barrister's clerk de la salud tan pronto como sea posible: -reacciones alrgicas como erupcin cutnea,  picazn o urticarias, hinchazn de los labios, boca, lengua o garganta -heces de color oscuro o con aspecto alquitranado -pulso cardiaco rpido o irregular -fiebre -dolor, calambre muscular -hormigueo o entumecimiento de manos o pies -dificultad al respirar -dificultad para orinar -sangrado inusual -cansancio o debilidad inusual Efectos secundarios que, por lo general, no requieren atencin mdica (debe informarlos a su mdico o a su profesional de la salud si persisten o si son molestos): -cambios en la voz o crecimiento del  pelo -confusin -mareos, somnolencia -boca seca, aumento de la sed -agrandamiento o sensibilidad de los pechos -dolor de cabeza -perodos menstruales irregulares -dificultad sexual, incapacidad para Special educational needs teacher ereccin -Higher education careers adviser Puede ser que esta lista no menciona todos los posibles efectos secundarios. Comunquese a su mdico por asesoramiento mdico Humana Inc. Usted puede informar los efectos secundarios a la FDA por telfono al 1-800-FDA-1088. Dnde debo guardar mi medicina? Mantngala fuera del alcance de los nios. Gurdela a [employer]PL Group, a menos de 25 grados C (80 grados F). Deseche los medicamentos que no haya utilizado, despus de la fecha de vencimiento. ATENCIN: Este folleto es un resumen. Puede ser que no cubra toda la posible informacin. Si usted tiene preguntas acerca de esta medicina, consulte con su mdico, su farmacutico o su profesional de Technical sales engineer.    2016, Elsevier/Gold Standard. (2014-03-22 00:00:00)   Alprazolam tablets Qu es este medicamento? El ALPRAZOLAM es una benzodiacepina. Se utiliza para tratar la ansiedad y los ataques de pnico. [place] medicamento puede ser utilizado para otros usos; si tiene alguna pregunta consulte con su proveedor de atencin mdica o con su farmacutico. Qu le debo informar a mi profesional de la salud antes de tomar este medicamento? Necesita saber si usted presenta alguno de los siguientes problemas o situaciones: -problema de alcoholismo o drogadiccin -trastorno bipolar, depresin, psicosis u otros problemas de salud mental -glaucoma -enfermedad renal o heptica -enfermedad pulmonar o respiratoria -miastenia gravis -enfermedad de Parkinson -porfiria -convulsiones o antecedentes de convulsiones -ideas suicidas -una reaccin alrgica o inusual al alprazolam, a otras benzodiacepinas, alimentos, colorantes o conservantes -si est embarazada o buscando quedar embarazada -si est  amamantando a un beb Cmo debo utilizar este medicamento? Tome este medicamento por va oral con un vaso de agua. Siga las instrucciones de la etiqueta del [place]. Tome sus dosis a intervalos regulares. No tome su medicamento con una frecuencia mayor a la indicada. Si ha venido tomando Coca-Cola de [place] regular durante algn tiempo, no deje de tomarlo repentinamente. Debe reducir gradualmente la dosis para no sufrir efectos secundarios severos. Consulte a su mdico o a su profesional de la salud por asesoramiento. Aun despus de dejar de tomarlo, los efectos del medicamento en su cuerpo pueden perdurar [employer]. Hable con su pediatra para informarse acerca del uso de este medicamento en nios. Puede requerir atencin especial. Sobredosis: Pngase en contacto inmediatamente con un centro toxicolgico o una sala de urgencia si usted cree que haya tomado demasiado medicamento. ATENCIN: [employer] Foods es solo para usted. No comparta este medicamento con nadie. Qu sucede si me olvido de una dosis? Si olvida una dosis, tmela lo antes posible. Si es casi la hora de la prxima dosis, tome slo esa dosis. No tome dosis adicionales o dobles. Qu puede interactuar con este medicamento? No tome esta medicina junto con ninguno de los siguientes medicamentos: -algunos medicamentos para la infeccin por VIH o SIDA -quetoconazol -itraconazol Esta medicina tambin puede interactuar con los siguientes medicamentos: -pldoras anticonceptivas -algunos antibiticos macrlidos,  tales como claritromicina, eritromicina o troleandomicina -cimetidina -ciclosporina -ergotamina -jugo de toronja -suplementos dietticos o a base de hierbas, como kava kava, melatonina, dehidroepiandrosterona, DHEA, hierba de [place] o valeriana -imatinib, STI-571 -isoniazida -levodopa -medicamentos para la depresin, ansiedad o trastornos psicticos -analgsicos recetados -rifampicina, rifapentina o  rifabutina -algunos medicamentos para la presin sangunea o problemas cardiacos -algunos medicamentos para las convulsiones, tales como [place], [place], [place], Museum/gallery curator o primidona Puede ser que esta lista no menciona todas las posibles interacciones. Informe a su profesional de KB Home	[place] de [employer] Corporation productos a base de hierbas, medicamentos de [place] o suplementos nutritivos que est tomando. Si usted fuma, consume bebidas alcohlicas o si utiliza drogas ilegales, indqueselo tambin a su profesional de KB Home	[place]. Algunas sustancias pueden interactuar con su medicamento. A qu debo estar atento al usar Coca-Cola? Visite a su mdico o a su profesional de la salud para chequear su evolucin peridicamente. Su cuerpo puede hacerse dependiente del medicamento. Por esta razn, pregunte a su mdico o a su profesional de la salud si todava necesita tomarlo. Puede experimentar somnolencia o mareos. No conduzca ni utilice maquinaria, ni haga nada que Associate Professor en estado de alerta hasta que sepa cmo le afecta este medicamento. Para reducir el riesgo de mareos o [place], no se ponga de pie ni se siente con rapidez, especialmente si es un paciente de edad avanzada. El alcohol puede aumentar su somnolencia y [place]. Evite consumir bebidas alcohlicas. No se trate usted mismo si tiene tos, resfro o Set designer sin Teacher, adult education a su mdico o a su profesional de Technical sales engineer. Algunos ingredientes pueden aumentar los posibles efectos secundarios. Qu efectos secundarios puedo tener al [employer] Corporation este medicamento? Efectos secundarios que debe informar a su mdico o a Barrister's clerk de la salud tan pronto como sea posible: -Chief of Staff como erupcin cutnea, picazn o urticarias, hinchazn de la cara, labios o lengua -confusin, tendencia a olvidar -depresin -dificultad para conciliar el sueo -dificultad para hablar -sensacin de desmayos o mareos, cadas -cambios de  humor, excitabilidad o comportamiento agresivo -calambres musculares -dificultad para orinar o cambios en el volumen de orina -cansancio o debilidad inusual Efectos secundarios que, por lo general, no requieren atencin mdica (debe informarlos a su mdico o a su profesional de la salud si persisten o si son molestos): -cambios en el deseo sexual o capacidad -cambios del apetito Puede ser que esta lista no menciona todos los posibles efectos secundarios. Comunquese a su mdico por asesoramiento mdico Humana Inc. Usted puede informar los efectos secundarios a la FDA por telfono al 1-800-FDA-1088. Dnde debo guardar mi medicina? Mantngala fuera del alcance de los nios. Este medicamento puede ser abusado. Mantenga su medicamento en un lugar seguro para protegerlo contra robos. No comparta este medicamento con nadie. Es peligroso vender o Associate Professor y est prohibido por la ley. Gurdelo a [employer], entre 20 y 74 grados C (31 y 77 grados F). Este medicamento puede causar sobredosis accidental y muerte si otros adultos, nios o Copy se lo toman. Mezcle cualquier medicamento sin usar con Boeing arena de [place] o granos de caf. [place] Runner, broadcasting/film/video en un contenedor cerrado como una bolsa cerrada o una lata de caf con una tapa. No utilice el medicamento despus de la fecha de vencimiento. ATENCIN: Este folleto es un resumen. Puede ser que no cubra toda la posible informacin. Si usted tiene preguntas acerca de esta medicina, consulte con su mdico, su farmacutico o su profesional  de KB Home	[place].    2016, Elsevier/Gold Standard. (2014-03-22 00:00:00)

## 2015-07-06 NOTE — Telephone Encounter (Signed)
Orders placed for left breast ultrasound since patient is not yet 30, ultrasound will be schedule, they will contact to schedule.

## 2015-07-06 NOTE — Progress Notes (Signed)
HPI:   Patient is a 30 year old that presented to the office today with several complaints as follows: #1 left breast tenderness upper outer quadrant for the past 10 months. Patient several years ago had a left breast biopsy which was benign. #2 patient with a lot of anxiety (rate to take a trip 1 some assistance with some medication such as Xanax to help calm her down. #3 patient with low abdominal discomfort radiating to the back mostly right before her cycle but at different times during the month as well #4 patient complaining of coarse hairs on her chin #5 frequency in urination and nocturia but no dysuria although she consumes large quantities of water during the day up to 7 PM.   ROS: A ROS was performed and pertinent positives and negatives are included in the history.  GENERAL: No fevers or chills. HEENT: No change in vision, no earache, sore throat or sinus congestion. NECK: No pain or stiffness. CARDIOVASCULAR: No chest pain or pressure. No palpitations. PULMONARY: No shortness of breath, cough or wheeze. GASTROINTESTINAL: No abdominal pain, nausea, vomiting or diarrhea, melena or bright red blood per rectum. GENITOURINARY: No urinary frequency, urgency, hesitancy or dysuria. MUSCULOSKELETAL: No joint or muscle pain, no back pain, no recent trauma. DERMATOLOGIC: No rash, no itching, no lesions. ENDOCRINE: No polyuria, polydipsia, no heat or cold intolerance. No recent change in weight. HEMATOLOGICAL: No anemia or easy bruising or bleeding. NEUROLOGIC: No headache, seizures, numbness, tingling or weakness. PSYCHIATRIC: No depression, no loss of interest in normal activity or change in sleep pattern.   PE: well-developed well-nourished female with above mentioned complaint HEENT:  Coarse hairs seen at the chin Breast exam: Both breasts were examined sitting supine position right breast no palpable mass or tenderness no supra clavicular axillary lymphadenopathy no nipple inversion  nontender. Left breast scar left upper outer quadrant and tender in that area between the 12 and 3:00 position but no discernible mass. No nipple inversion no skin discoloration no supraclavicular axillary lymphadenopathy Abdomen: Soft nontender no rebound or guarding Pelvic: Bartholin urethra Skene was within normal limits Vagina: No lesions or discharge Cervix: No lesions or discharge Uterus: Anteverted normal size shape and consistency Adnexa: No palpable masses or tenderness Rectal exam: Not done    Assessment Plan: #1 for patient's anxiety she'll be prescribed Xanax 0.25 mg 1 by mouth daily or twice a day when necessary. #2 patient will be scheduled for diagnostic mammogram and possible ultrasound of the left upper quadrant area of tenderness and previous biopsy. Probably fibrocystic changes. Patient started to cut down on caffeine-containing products. #3 patient with mild hirsutism will be started on Aldactone 50 mg to take 1 by mouth daily. A patient becomes pregnant she should stop the medication although patient states that her husband is considering a vasectomy. Patient currently not using any form of contraception. #4 patient will be scheduled for sonohysterogram here in the office in the next few weeks to compare with 2 years ago to make sure that there is no intracavitary defects such as endometrial polyps or submucous myoma and at which time we will do an endometrial biopsy. #5 it appears she does not have detrusor dyssynergia because his based on the amount of consumption of fluid that she is taking during the day she states that she drinks [place] 3 large bowels a day up to 7 PM so as not true overactive bladder the needs medication at this time I have reassured her. She has no urinary incontinence and  no dysuria.     Greater than 50% of time was spent in counseling and coordinating care of this patient.   Time of consultation:  25   Minutes.

## 2015-07-06 NOTE — Telephone Encounter (Signed)
-----   Message from Terrance Mass, MD sent at 07/06/2015  9:48 AM EDT ----- Please schedule for this patient a diagnostic mammogram possible ultrasound. Left upper quadrant breast tenderness prior history of biopsy several years ago benign same area

## 2015-07-12 NOTE — Telephone Encounter (Signed)
Error with the below date, appt is on 07/14/15 @ 2:45pm

## 2015-07-12 NOTE — Telephone Encounter (Signed)
Appointment on 07/15/15 @ 2:[facility]pm [facility] will have claudia relay to pt.

## 2015-07-14 ENCOUNTER — Other Ambulatory Visit: Payer: BLUE CROSS/BLUE SHIELD

## 2015-07-18 ENCOUNTER — Other Ambulatory Visit: Payer: Self-pay | Admitting: Gynecology

## 2015-07-18 DIAGNOSIS — N939 Abnormal uterine and vaginal bleeding, unspecified: Secondary | ICD-10-CM

## 2015-07-24 ENCOUNTER — Ambulatory Visit
Admission: RE | Admit: 2015-07-24 | Discharge: 2015-07-24 | Disposition: A | Discharge status: Home / Self Care | Payer: BLUE CROSS/BLUE SHIELD | Source: Ambulatory Visit | Attending: Gynecology | Admitting: Gynecology

## 2015-07-24 DIAGNOSIS — N644 Mastodynia: Secondary | ICD-10-CM

## 2015-07-25 ENCOUNTER — Telehealth: Payer: Self-pay | Admitting: Gynecology

## 2015-07-25 NOTE — Telephone Encounter (Signed)
07/25/15-I LM VM for pt stating she has a $50 copay for sonohysterogram/bx appt. Per [email or web address]@BC [id number]

## 2015-07-31 ENCOUNTER — Ambulatory Visit (INDEPENDENT_AMBULATORY_CARE_PROVIDER_SITE_OTHER): Payer: BLUE CROSS/BLUE SHIELD

## 2015-07-31 ENCOUNTER — Other Ambulatory Visit: Payer: BLUE CROSS/BLUE SHIELD

## 2015-07-31 ENCOUNTER — Ambulatory Visit: Payer: BLUE CROSS/BLUE SHIELD | Admitting: Gynecology

## 2015-07-31 ENCOUNTER — Other Ambulatory Visit: Payer: Self-pay | Admitting: Gynecology

## 2015-07-31 ENCOUNTER — Ambulatory Visit (INDEPENDENT_AMBULATORY_CARE_PROVIDER_SITE_OTHER): Payer: BLUE CROSS/BLUE SHIELD | Admitting: Gynecology

## 2015-07-31 DIAGNOSIS — N938 Other specified abnormal uterine and vaginal bleeding: Secondary | ICD-10-CM | POA: Diagnosis not present

## 2015-07-31 DIAGNOSIS — N942 Vaginismus: Secondary | ICD-10-CM

## 2015-07-31 DIAGNOSIS — N939 Abnormal uterine and vaginal bleeding, unspecified: Secondary | ICD-10-CM | POA: Diagnosis not present

## 2015-07-31 DIAGNOSIS — N920 Excessive and frequent menstruation with regular cycle: Secondary | ICD-10-CM

## 2015-07-31 DIAGNOSIS — N946 Dysmenorrhea, unspecified: Secondary | ICD-10-CM

## 2015-07-31 MED ORDER — TRANEXAMIC ACID 650 MG PO TABS: 1300.0000 mg | ORAL_TABLET | Freq: Three times a day (TID) | ORAL | Status: DC

## 2015-07-31 NOTE — Progress Notes (Signed)
   Patient is a 30 year old presented to the office today for sonohysterogram as a result of her menorrhagia and dysmenorrhea. She was seen the office on May 25 and had several problems including left upper quadrant (breast tenderness whereby she was sent for ultrasound and was found to be normal with the exception small benign-appearing axillary lymph node. She also has suffer from acne in the past but stated that she scheduled to have laser treatment and did not take the Aldactone that I had prescribed her. Her husband is in the process of having a vasectomy.  Patient was counseled for sonohysterogram. Findings from ultrasound as follows: Uterus measures 7.9 x 4.7 x 3.4 cm endometrial stripe of 6.2 mm. Right and left ovary were normal.   Her cervix was cleansed with Betadine solution. A 3 tenaculum was placed on the anterior cervical lip the cervix required dilatation. Patient with much vaginismus and discomfort the catheter was not able to be introduced all the way through the cavity. Ultrasound did not demonstrate any intracavitary defects and her ovaries were normal.   Assessment/plan: Patient with dysmenorrhea and menorrhagia. We did discuss treatment options to include oral contraceptive pill, endometrial ablation, or Mirena  patient stated that she is not interested in her husband have a vasectomy.  I will prescribed Lysteda 650 mg 2 tablets 3 times a day with the start of her menses. Risk benefits and pros and cons. Literature information was provided. Patient due for her annual exam at the end of the year.

## 2015-07-31 NOTE — Patient Instructions (Signed)
Tranexamic acid oral tablets Qu es este medicamento? El [employer] reduce o detiene el proceso mediante el cual los cogulos sanguneos se descomponen. Este medicamento se [place] para tratar los [employer]. Este medicamento puede ser utilizado para otros usos; si tiene alguna pregunta consulte con su proveedor de atencin mdica o con su farmacutico. Qu le debo informar a mi profesional de la salud antes de tomar este medicamento? Necesita saber si usted presenta alguno de los siguientes problemas o situaciones: sangrado en el cerebro problemas de coagulacin enfermedad renal problemas de visin una reaccin alrgica o inusual al cido tranexmico, a otros medicamentos, alimentos, colorantes o conservantes si est embarazada o buscando quedar embarazada si est amamantando a un beb Cmo debo utilizar este medicamento? Tome este medicamento por va oral con un vaso de agua. Siga las instrucciones de la etiqueta del [place]. No corte, triture ni mstique este medicamento. Puede tomarlo con o sin alimentos. Si le produce malestar estomacal, tmelo con alimentos. Tome sus dosis a intervalos regulares. No tome su medicamento con una frecuencia mayor a la indicada. No deje de tomarlo excepto si as lo indica su mdico. No tome este medicamento hasta que empieza su perodo. No lo tome durante ms de 5 das en seguidos. No tome este medicamento mientras no tiene su perodo. Hable con su pediatra para informarse acerca del uso de este medicamento en nios. Aunque este medicamento ha sido recetado a nias tan menores como de 12 aos de edad para condiciones selectivas, las precauciones se aplican. Sobredosis: Pngase en contacto inmediatamente con un centro toxicolgico o una sala de urgencia si usted cree que haya tomado demasiado medicamento. ATENCIN: [employer] es solo para usted. No comparta este medicamento con nadie. Qu sucede si me olvido de una dosis? Si  olvida una dosis, tmela cuando se recuerde y tome la prxima dosis por lo menos 6 horas despus. No tome ms de 2 tabletas a la vez para compensar las dosis olvidadas. Qu puede interactuar con este medicamento? No tome esta medicina con ninguno de los siguientes medicamentos: estrgenos pldoras, parches, inyecciones, anillos u otros dispositivos que contienen un estrgeno y [place] progestina Esta medicina tambin puede interactuar con los siguientes medicamentos: ciertos medicamentos usados para ayudar a que la sangre se coagule tretinona (tomado por va oral) Puede ser que esta lista no menciona todas las posibles interacciones. Informe a su profesional de [employer]	[place] de [employer] productos a base de hierbas, medicamentos de [place] o suplementos nutritivos que est tomando. Si usted fuma, consume bebidas alcohlicas o si utiliza drogas ilegales, indqueselo tambin a su profesional de [employer]	[place]. Algunas sustancias pueden interactuar con su medicamento. A qu debo estar atento al usar [employer]? Informe a su mdico o su profesional de la salud si sus sntomas no comienzan a mejorar o si empeoran. Informe a su mdico o su profesional de la salud si observa cualquier problema con los ojos mientras recibe [employer]. Su mdico le referir a Optician, dispensing de los ojos que [employer] ojos. Qu efectos secundarios puedo tener al [employer] este medicamento? Efectos secundarios que debe informar a su mdico o a Barrister's clerk de la salud tan pronto como sea posible: Chief of Staff como erupcin cutnea, picazn o urticarias, hinchazn de la cara, labios o lengua problemas respiratorios cambios en la visin dolor repentino o grave en el pecho, piernas, cabeza o ingle cansancio o debilidad inusual Efectos secundarios que, por lo general, no requieren atencin mdica (debe informarlos a  su mdico o a su profesional de la salud si persisten o si son molestos): dolor de espalda dolor de  cabeza dolores musculares o articulares problemas nasales o sinusales dolor estomacal cansancio Puede ser que esta lista no menciona todos los posibles efectos secundarios. Comunquese a su mdico por asesoramiento mdico [employer] Usted puede informar los efectos secundarios a la FDA por telfono al 1-800-FDA-1088. Dnde debo guardar mi medicina? Mantngala fuera del alcance de los nios. [place] a [employer], entre 15 y 3 grados C (34 y 35 grados F). Deseche todo el medicamento que no haya utilizado, despus de la fecha de vencimiento. ATENCIN: Este folleto es un resumen. Puede ser que no cubra toda la posible informacin. Si usted tiene preguntas acerca de esta medicina, consulte con su mdico, su farmacutico o su profesional de Technical sales engineer.    2016, Elsevier/[employer] Standard. (2014-03-23 00:00:00)

## 2015-09-29 ENCOUNTER — Ambulatory Visit (HOSPITAL_BASED_OUTPATIENT_CLINIC_OR_DEPARTMENT_OTHER)
Admission: RE | Admit: 2015-09-29 | Discharge: 2015-09-29 | Disposition: A | Discharge status: Home / Self Care | Payer: BLUE CROSS/BLUE SHIELD | Source: Ambulatory Visit | Attending: Osteopathic Medicine | Admitting: Osteopathic Medicine

## 2015-09-29 ENCOUNTER — Other Ambulatory Visit (HOSPITAL_BASED_OUTPATIENT_CLINIC_OR_DEPARTMENT_OTHER): Payer: Self-pay | Admitting: Osteopathic Medicine

## 2015-09-29 DIAGNOSIS — R1031 Right lower quadrant pain: Secondary | ICD-10-CM

## 2015-09-29 DIAGNOSIS — R932 Abnormal findings on diagnostic imaging of liver and biliary tract: Secondary | ICD-10-CM | POA: Insufficient documentation

## 2015-09-29 DIAGNOSIS — R1011 Right upper quadrant pain: Secondary | ICD-10-CM

## 2015-09-29 DIAGNOSIS — R1013 Epigastric pain: Secondary | ICD-10-CM | POA: Diagnosis present

## 2015-09-29 DIAGNOSIS — R110 Nausea: Secondary | ICD-10-CM | POA: Diagnosis present

## 2016-01-03 IMAGING — CT CT ABD-PELV W/ CM
2 of 4 series · 16 of 46 positions shown, 18 images · IV contrast (OMNIPAQUE 300)
Comparison: None available currently.

CLINICAL DATA: Right lower quadrant abdominal pain for 3 months.

EXAM:
CT ABDOMEN AND PELVIS WITH CONTRAST
TECHNIQUE: Multidetector CT imaging of the abdomen and pelvis was performed
using the standard protocol following bolus administration of
intravenous contrast.
CONTRAST:  25mL OMNIPAQUE IOHEXOL 300 MG/ML SOLN, 100mL OMNIPAQUE
IOHEXOL 300 MG/ML SOLN

[Series 2: abd/pel with · axial · 0.70mm/px · z∈[-407,+18]mm · 13 of 93 slices shown, 15 images]
[im 4/93  soft-tissue]
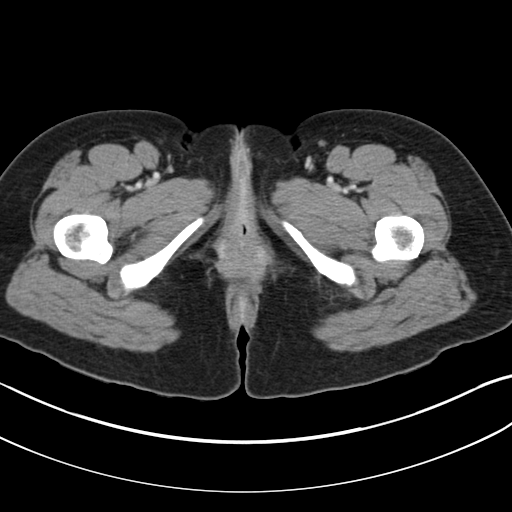
[im 4/93  bone]
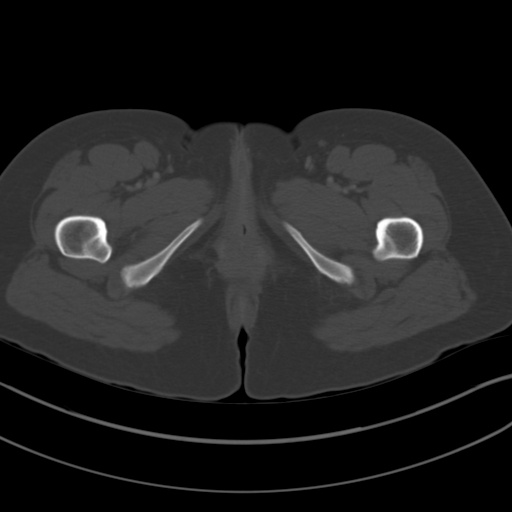
[im 11/93  soft-tissue]
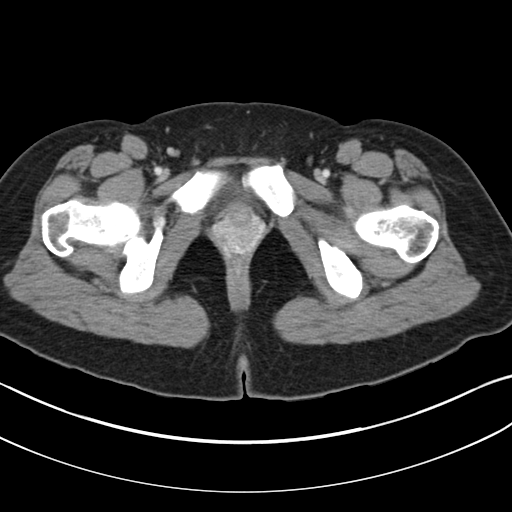
[im 18/93  soft-tissue]
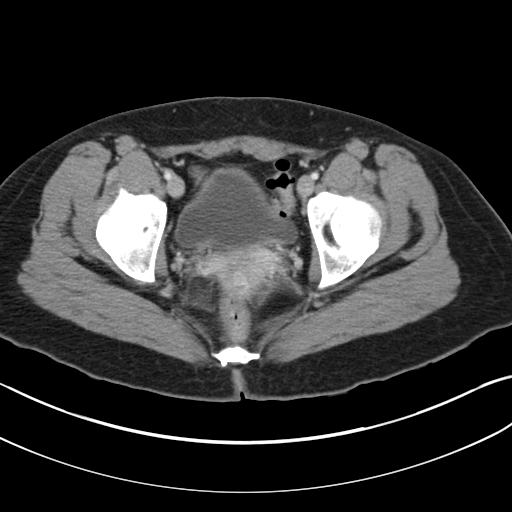
[im 25/93  soft-tissue]
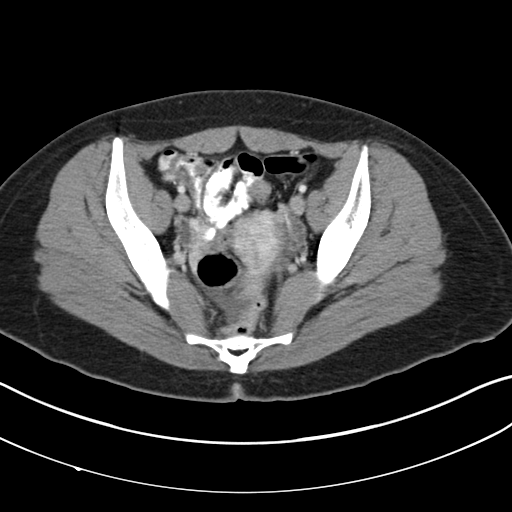
[im 32/93  soft-tissue]
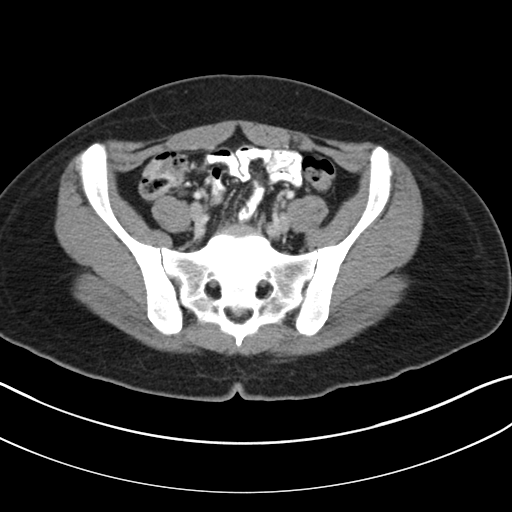
[im 39/93  soft-tissue]
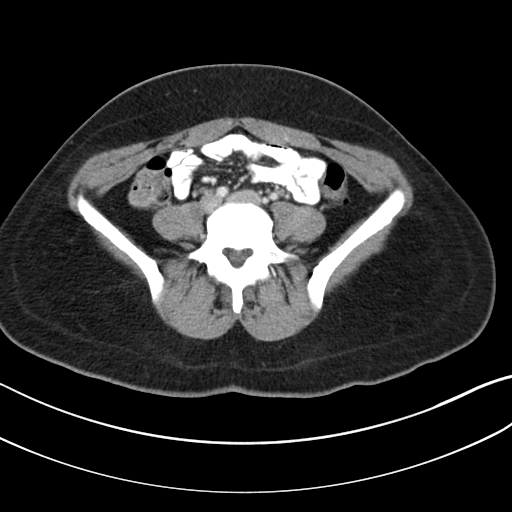
[im 47/93  soft-tissue]
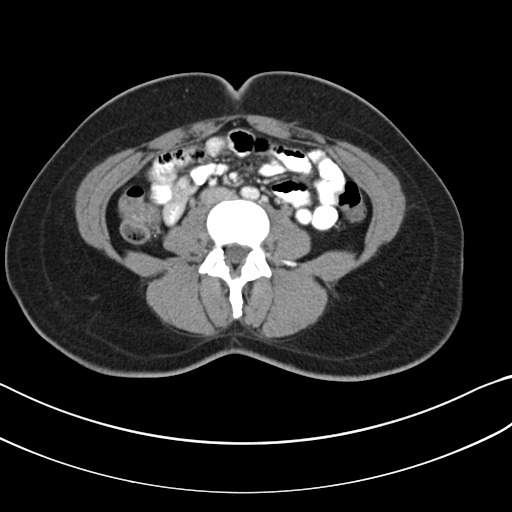
[im 54/93  soft-tissue]
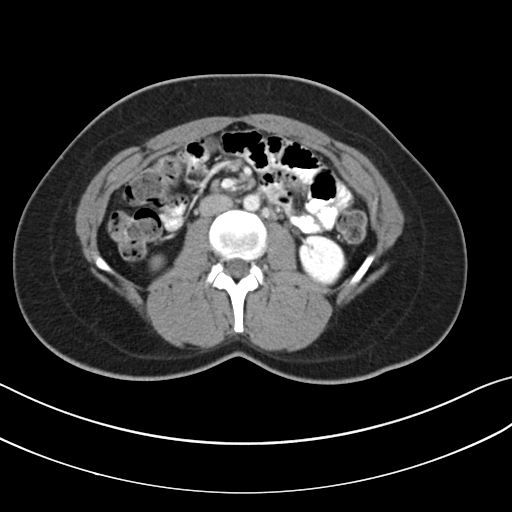
[im 61/93  soft-tissue]
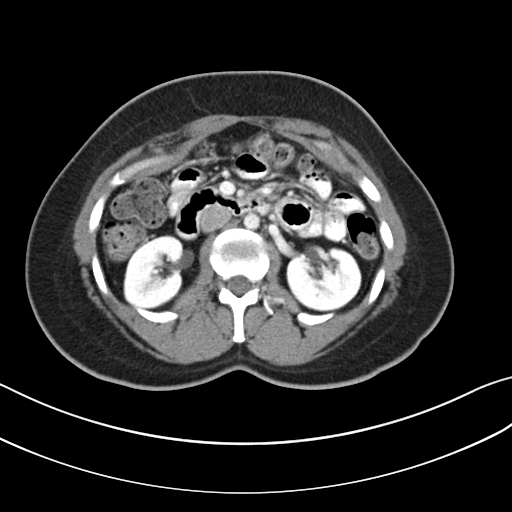
[im 61/93  bone]
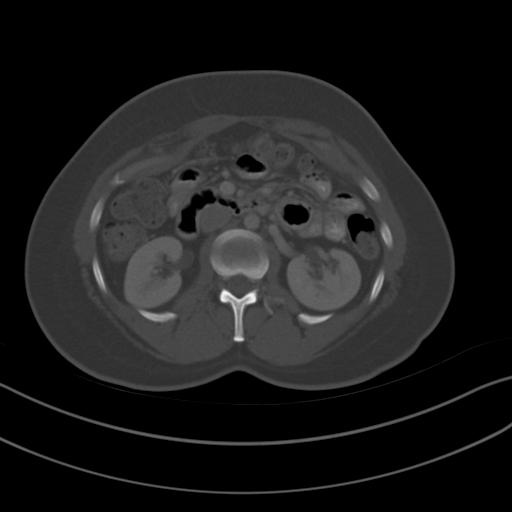
[im 68/93  soft-tissue]
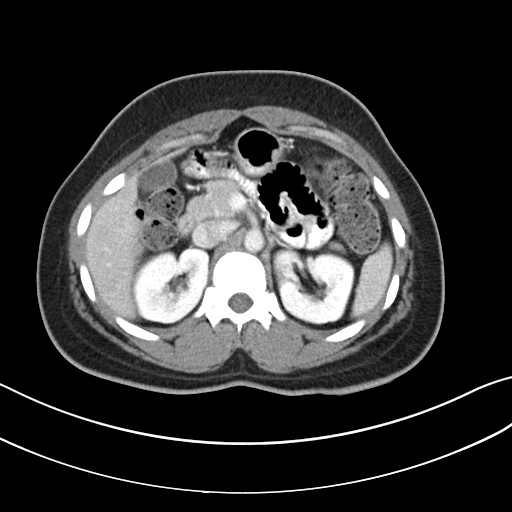
[im 75/93  soft-tissue]
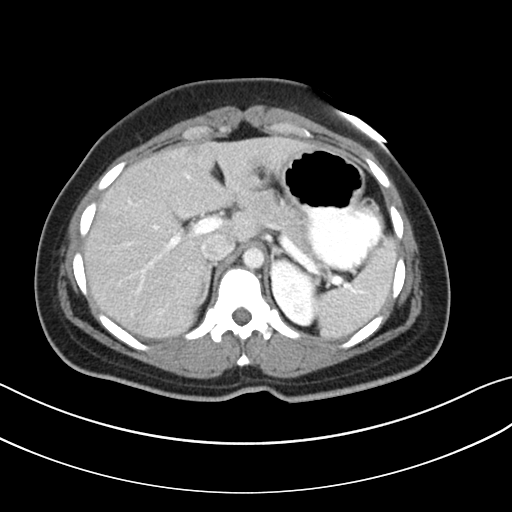
[im 82/93  soft-tissue]
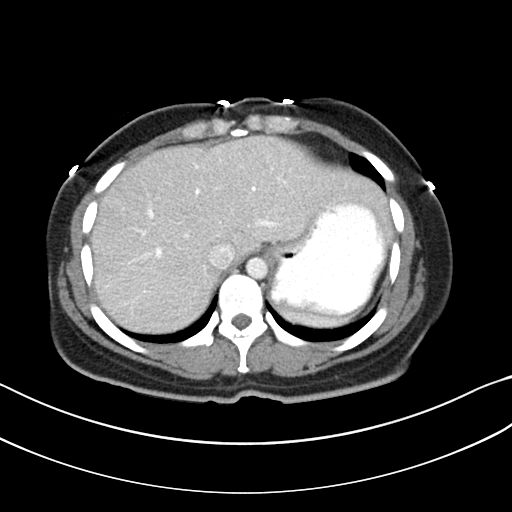
[im 89/93  soft-tissue]
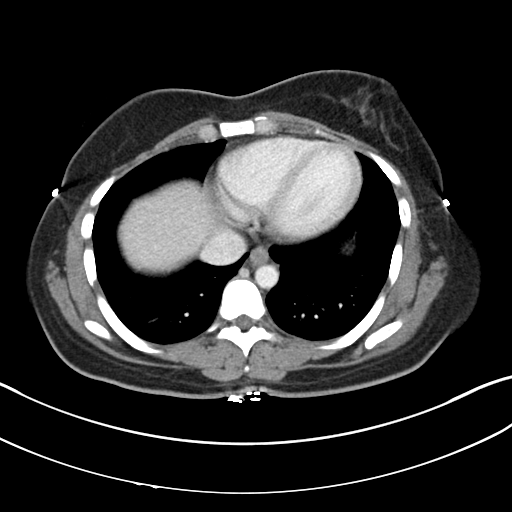

[Series 5: coronal a/|p · coronal · 0.69mm/px · 3 of 90 slices shown]
[im 30/90  soft-tissue]
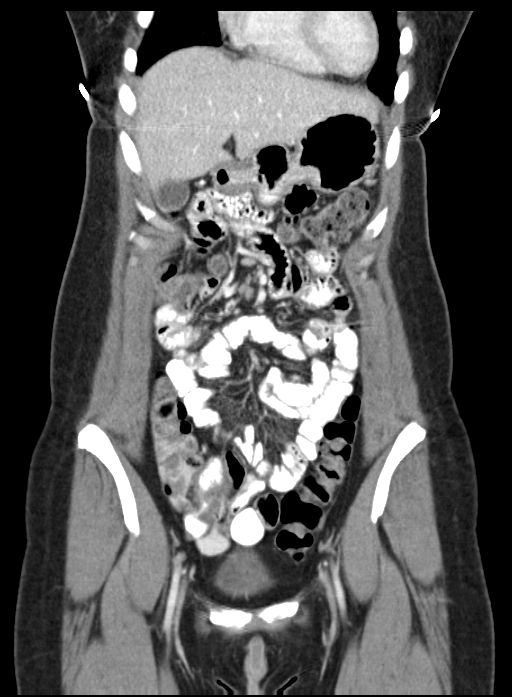
[im 40/90  soft-tissue]
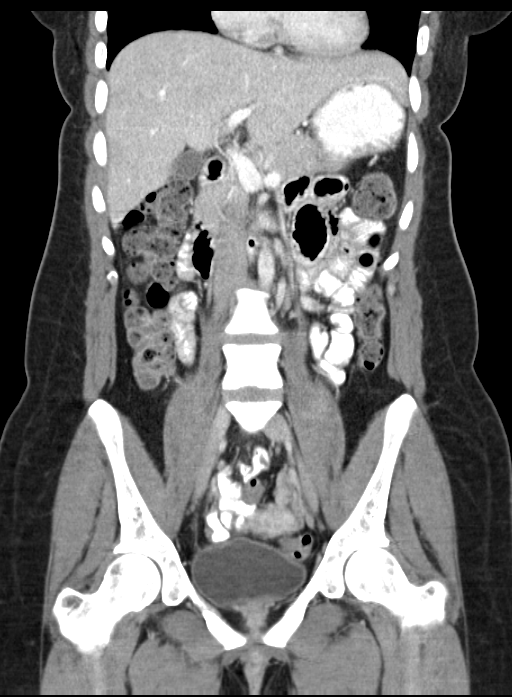
[im 50/90  soft-tissue]
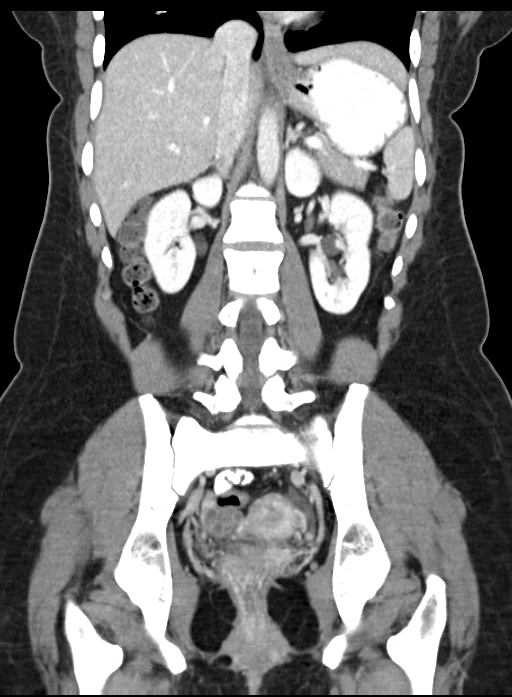

[16 of 46 positions shown; findings below may reference images not displayed]

FINDINGS: Visualized lung bases appear normal. No significant osseous
abnormality is noted.

No gallstones are noted. The liver, spleen and pancreas appear
normal. Adrenal glands and kidneys appear normal. No hydronephrosis
or renal obstruction is noted. The appendix appears normal. Uterus
and ovaries are unremarkable there is no evidence of bowel
obstruction. No abnormal fluid collection is noted. Urinary bladder
appears normal. No significant adenopathy is noted. There is the
suggestion of mild wall thickening involving the ileocecal valve
which could represent inflammation related to inflammatory bowel
disease. This is best visualized on image number 35 of series 5.
IMPRESSION: The appendix appears normal. There is possible mild wall thickening
involving the ileocecal valve which could represent inflammation
related to inflammatory bowel disease. No other significant
abnormality is noted in the abdomen or pelvis.

## 2016-01-29 ENCOUNTER — Encounter: Payer: Self-pay | Admitting: Gynecology

## 2016-01-29 ENCOUNTER — Ambulatory Visit (INDEPENDENT_AMBULATORY_CARE_PROVIDER_SITE_OTHER): Payer: BLUE CROSS/BLUE SHIELD | Admitting: Gynecology

## 2016-01-29 VITALS — BP 116/78 | Ht 63.5 in | Wt 140.0 lb

## 2016-01-29 DIAGNOSIS — R140 Abdominal distension (gaseous): Secondary | ICD-10-CM

## 2016-01-29 DIAGNOSIS — N941 Unspecified dyspareunia: Secondary | ICD-10-CM | POA: Diagnosis not present

## 2016-01-29 DIAGNOSIS — Z01411 Encounter for gynecological examination (general) (routine) with abnormal findings: Secondary | ICD-10-CM | POA: Diagnosis not present

## 2016-01-29 DIAGNOSIS — N920 Excessive and frequent menstruation with regular cycle: Secondary | ICD-10-CM | POA: Diagnosis not present

## 2016-01-29 DIAGNOSIS — Z8742 Personal history of other diseases of the female genital tract: Secondary | ICD-10-CM | POA: Diagnosis not present

## 2016-01-29 MED ORDER — LEVONORGESTREL-ETHINYL ESTRAD 0.1-20 MG-MCG PO TABS: ORAL_TABLET | ORAL | 4 refills | Status: DC

## 2016-01-29 NOTE — Progress Notes (Signed)
Monica Phelps [id number]   History:    30 y.o.  for annual gyn exam with a multitude of complaints today and came to the office with her mother. Patient was complaining of bloating dyspareunia low abdominal discomfort back discomfort and cramping with her cycles and bleeding review of her record indicated that less than 6 months ago she had a sonohysterogram with the following noted:  Her uterus measures 7.9 x 4.7 3.4 cm with endometrial stripe of 6.2 mm right and left ovary normal. Sonohysterogram did not demonstrate any intracavitary defect. Patient had been offered on numerous occasions several treatment options which she has been resistant such as: Lysteda  650 mg tablet 2 tablets 3 times a day for 5 days during her menses. We also discussed a Mirena IUD since many years ago in another facility she was diagnosed with endometriosis this would help not only for contraception but for pelvic pain as well. We also discussed GnRH analog such as Lupron injection every 3 months for 6 month and reassess at that point. We also discussed continues oral contraceptive pill for at least half a year with symptoms don't improve consideration of laparoscopy. She states that her husband no longer want to have children and she would like to in the near future consider a hysterectomy.  Patient stated several years ago in another practice because of cervicitis she had cryotherapy of her cervix.In early 2015 patient had a second trimester miscarriage at [redacted] weeks gestation. She had been at bedrest since 16 weeks secondary to preterm premature ruptured membranes. She continued to bleed was taken back to the operating room for a D&C was performed believing there was probably retained products of conception and she has continued to bleed. She had been placed on antibiotic for suspected chorioamnionitis leading to her preterm premature rupture membranes. She stated also says she was tested and was positive for  group B strep. They had tested her for von Willebrand along with CBC and PT and PTT as well as Leiden V Factor, as well as antiphospholipid antibodies IgG and IgA all studies were normal.   Past medical history,surgical history, family history and social history were all reviewed and documented in the EPIC chart.  Gynecologic History Patient's last menstrual period was 01/17/2016. Contraception: none Last Pa2016 Results were: normal Last mammogram not indicated Results were: Not indicated   Obstetric History OB History  Gravida Para Term Preterm AB Living  1       1 0  SAB TAB Ectopic Multiple Live Births  1            # Outcome Date GA Lbr Len/2nd Weight Sex Delivery Anes PTL Lv  1 SAB                ROS: A ROS was performed and pertinent positives and negatives are included in the history.  GENERAL: No fevers or chills. HEENT: No change in vision, no earache, sore throat or sinus congestion. NECK: No pain or stiffness. CARDIOVASCULAR: No chest pain or pressure. No palpitations. PULMONARY: No shortness of breath, cough or wheeze. GASTROINTESTINAL: No abdominal pain, nausea, vomiting or diarrhea, melena or bright red blood per rectum. GENITOURINARY: No urinary frequency, urgency, hesitancy or dysuria. MUSCULOSKELETAL: No joint or muscle pain, no back pain, no recent trauma. DERMATOLOGIC: No rash, no itching, no lesions. ENDOCRINE: No polyuria, polydipsia, no heat or cold intolerance. No recent change in weight. HEMATOLOGICAL: No anemia or easy bruising or bleeding. NEUROLOGIC:  No headache, seizures, numbness, tingling or weakness. PSYCHIATRIC: No depression, no loss of interest in normal activity or change in sleep pattern.     Exam: chaperone present  BP 116/78   Ht 5' 3.5" (1.613 m)   Wt 140 lb (63.5 kg)   LMP 01/17/2016   BMI 24.41 kg/m   Body mass index is 24.41 kg/m.  General appearance : Well developed well nourished female. No acute distress HEENT: Eyes: no retinal  hemorrhage or exudates,  Neck supple, trachea midline, no carotid bruits, no thyroidmegaly Lungs: Clear to auscultation, no rhonchi or wheezes, or rib retractions  Heart: Regular rate and rhythm, no murmurs or gallops Breast:Examined in sitting and supine position were symmetrical in appearance, no palpable masses or tenderness,  no skin retraction, no nipple inversion, no nipple discharge, no skin discoloration, no axillary or supraclavicular lymphadenopathy Abdomen: no palpable masses or tenderness, no rebound or guarding Extremities: no edema or skin discoloration or tenderness  Pelvic:  Bartholin, Urethra, Skene Glands: Within normal limits             Vagina: No gross lesions or discharge  Cervix: No gross lesions or discharge  Uteruanteverted, normal size, shape and consistency, non-tender and mobile  Adnexa  Without masses or tenderness  Anus and perineum  normal   Rectovaginal  normal sphincter tone without palpated masses or tenderness             Hemoccult Not indicated  Assessment/plan: 30 year old with past history at another facility with a diagnosis given of endometriosis numerous treatment options have been offered patient is now decided to proceed with continuous oral contraceptive pill. She will be started on Aviane oral contraceptive pill that she'll take continuously withdrawal every 3 months. If after 6 months no improvement we may want to reconsider laparoscopy and/or consideration if she is under percent sure proceed with LAVH and bilateral salpingectomy. Literature information was provided on the above. Risk benefits and pros and cons of oral contraceptive pills to include DVT and pulmonary embolism was discussed.   Terrance Mass MD, 5:13 PM 01/29/2016

## 2016-01-29 NOTE — Patient Instructions (Addendum)
Informacin sobre los anticonceptivos orales (Oral Contraception Information) Los anticonceptivos orales (ACO) son medicamentos que se utilizan para evitar el embarazo. Su funcin es evitar que los ovarios liberen vulos. Las hormonas de los ACO tambin hacen que el moco cervical se haga ms espeso, lo que evita que el esperma ingrese al tero. Tambin hacen que la membrana que recubre internamente al tero se vuelva ms fina, lo que no permite que el huevo fertilizado se adhiera a la pared del tero. Los ACO son muy efectivos cuando se toman exactamente como se prescriben. Sin embargo, no previenen contra las enfermedades de transmisin sexual (ETS). La prctica del sexo seguro, como el uso de preservativos, junto con la pldora, ayudan a prevenir ese tipo de enfermedades. Antes de tomar la pldora, usted debe hacerse un examen fsico y un test de Pap. El mdico podr indicarle anlisis de sangre, si es necesario. El mdico se asegurar de que usted sea una buena candidata para usar anticonceptivos orales. Converse con su mdico acerca de los posibles efectos secundarios de los ACO que podran recetarle. Cuando se inicia el uso de ACO, puede llevar 2 a 3 meses para que su organismo se adapte a los cambios en los niveles hormonales. TIPOS DE ANTICONCEPTIVOS ORALES  Pldora combinada: esta pldora contiene las hormonas estrgeno y progestina (progesterona sinttica). La pldora combinada viene en envases para 21 das, 28 das o 91 das. Algunos tipos de pldoras combinadas deben tomarse de manera continua (pldoras para 365 das). En los envases para 21 das, usted no tomar las pldoras durante 7 das despus de la ltima pldora. En los envases para 28 das, la pldora se toma todos los das. Las ltimas 7 no contienen hormonas. Ciertos tipos de pldoras tienen ms de 21 pldoras que contienen hormonas. En los envases para 91 das, las primeras 84 pldoras contienen ambas hormonas y las ltimas 7 pldoras no  contienen hormonas o contienen slo estrgenos.  La minipldora: esta pldora contiene la hormona progesterona solamente. Es necesario tomarla todos los das de manera continua. Es importante que las tome a la misma hora todos los das. Viene en envases de 28 pldoras. Las 28 pldoras contienen la hormona. VENTAJAS  DE LOS ANTICONCEPTIVOS ORALES  Disminuye los sntomas premenstruales.  Se usa para tratar los clicos menstruales.  Regula el ciclo menstrual.  Disminuye el ciclo menstrual abundante.  Puede mejorar el acn, segn el tipo de pldora.  Trata hemorragias uterinas anormales.  Trata el sndrome ovrico poliqustico.  Trata la endometriosis.  Pueden usarse como anticonceptivo de emergencia. FACTORES QUE PUEDEN HACER QUE LOS ANTICONCEPTIVOS ORALES SEAN MENOS EFECTIVOS Pueden ser menos efectivos si:  Olvid tomar la pldora todos los das a la misma hora.  Tiene una enfermedad estomacal o intestinal que disminuye la absorcin de la pldora.  Ingiere simultneamente los anticonceptivos orales junto con otros medicamentos que los hacen menos efectivos, como antibiticos, ciertos medicamentos para el VIH y algunos medicamentos para las convulsiones.  Usted toma anticonceptivos orales que han vencido.  Cuando se usa el envase de 21 das, se olvida de recomenzar el uso en el da 7. RIESGOS ASOCIADOS AL USO DE ANTICONCEPTIVOS ORALES Los anticonceptivos orales pueden en algunos casos causar efectos secundarios como:  Dolor de cabeza.  Nuseas.  Inflamacin mamaria.  Hemorragia vaginal o manchado irregular. Las pldoras combinadas tambin se asocian a un pequeo aumento en el riesgo de:  Cogulos sanguneos.  Ataque cardaco.  Ictus. Esta informacin no tiene como fin reemplazar el consejo del mdico. Asegrese   de hacerle al mdico cualquier pregunta que tenga. Document Released: 11/07/2004 Document Revised: 05/22/2015 Document Reviewed: 07/19/2012 Elsevier  Interactive Patient Education  2017 [place] vaginal asistida por laparoscopa (Laparoscopically Assisted Vaginal Hysterectomy) La histerectoma vaginal asistida por laparoscopa (HVAL) es un procedimiento quirrgico para extirpar el tero y el cuello uterino, y en algunos casos los ovarios y las trompas de [place]. Durante la HVAL, cierta parte de la remocin [place] se realiza a travs de la vagina, y el resto a travs de pequeos cortes quirrgicos (incisiones) en el abdomen. Este procedimiento generalmente se indica en mujeres en las que la histerectoma vaginal no es una opcin. El mdico [employer] riesgos y beneficios de los diferentes tcnicas quirrgicas cuando concurra a la visita. Generalmente el tiempo de recuperacin es rpido y hay menos complicaciones luego de los procedimientos laparoscpicos que en los procedimientos de [place]. INFORME A SU MDICO:  Cualquier alergia que tenga.  Todos los [employer] [place], incluyendo vitaminas, hierbas, gotas oftlmicas, cremas y medicamentos de venta libre.  Problemas previos que usted o los [employer] de su familia hayan tenido con el uso de anestsicos.  Enfermedades de [employer].  Cirugas previas.  Padecimientos mdicos. RIESGOS Y COMPLICACIONES Generalmente es un procedimiento seguro. Sin embargo, Games developer procedimiento, pueden surgir complicaciones. Las complicaciones posibles son:  Risk analyst a los medicamentos.  Dificultad para respirar.  Hemorragias.  Infeccin.  Dao a otras estructuras cercanas al tero y al cuello. ANTES DEL PROCEDIMIENTO  Consulte a su mdico si debe cambiar o suspender los medicamentos que toma habitualmente.  [employer], como los que necesita para prepararse vaciando el colon, segn las indicaciones.  No coma ni beba nada durante al menos 8 horas antes del procedimiento.  Si fuma, abandone el hbito. Si deja de fumar mejorar su  salud despus de la [place].  Pdale a alguien que la lleve a su casa despus de la ciruga y la ayude en casa mientras se recupera. PROCEDIMIENTO  Le colocarn una va intravenosa (IV) en una vena para administrarle lquidos y medicamentos.  Recibir medicamentos para relajarse y otros que la harn dormir (anestesia general).  Le colocarn un tubo flexible (catter) en la vejiga para drenar la orina.  Tambin insertarn un tubo a travs de la nariz o la boca hacia el estmago (sonda nasogstrica). La sonda nasogstrica drena los jugos digestivos e impide que sienta nuseas o tenga vmitos.  Le colocarn medias ajustadas (compresin) en las piernas para Product manager.  Le practicarn tres o cuatro incisiones pequeas en el abdomen. Tambin le harn una incisin en la vagina. Le insertarn unas sondas e instrumentos a travs de las pequeas incisiones. Extirparn el tero y el cuello uterino (y posiblemente los ovarios y las trompas de Falopio) a travs de la vagina, as como a travs de las pequeas incisiones que se hicieron en el abdomen.  Luego la vagina se sutura a su estado normal. DESPUS DEL PROCEDIMIENTO  Posiblemente deba seguir una dieta lquida por un tiempo. Lo ms probable es volver a la dieta habitual y Engineer, structural bien al da siguiente de la [place].  Tendr que orinar a travs de un catter. Este se Teacher, English as a foreign language despus de la [place].  Le harn controles regulares de la temperatura, frecuencia cardaca y respiratoria, presin arterial y nivel de oxgeno.  Seguir [employer] de compresin en las piernas hasta que pueda moverse.  Usar un dispositivo especial o har ejercicios de respiracin para mantener los pulmones limpios.  La alentarn a caminar lo ms pronto posible. Esta informacin no tiene Marine scientist el consejo del mdico. Asegrese de hacerle al mdico cualquier pregunta que tenga. Document Released: 01/17/2011 Document Revised:  02/18/2014 Document Reviewed: 08/13/2012 Elsevier Interactive Patient Education  2017 [place] de diagnstico (Diagnostic Laparoscopy) La laparoscopia de diagnstico es un procedimiento que se hace para diagnosticar enfermedades en el abdomen. Durante su realizacin, se introduce en el abdomen un instrumento delgado del tamao de un lpiz que tiene Moro luz, llamado laparoscopio, a travs de una incisin. El laparoscopio le permite al mdico observar los rganos internos. INFORME A SU MDICO:  Cualquier alergia que tenga.  Todos los [employer], incluidos vitaminas, hierbas, gotas oftlmicas, cremas y medicamentos de venta libre.  Problemas previos que usted o los [employer] de su familia hayan tenido con el uso de anestsicos.  Enfermedades de la sangre que tenga.  Si tiene cirugas previas.  Enfermedades que tenga. RIESGOS Y COMPLICACIONES En general, se trata de un procedimiento seguro. Sin embargo, es posible que haya problemas, que pueden incluir lo siguiente:  Infeccin.  Hemorragia.  Lesiones en los rganos circundantes.  Reaccin alrgica a la anestesia usada durante el procedimiento. ANTES DEL PROCEDIMIENTO  No coma ni beba nada despus de la medianoche anterior al procedimiento o segn lo que le haya indicado el mdico.  Consulte a su mdico acerca de estos temas:  Cambiar o suspender los medicamentos que toma habitualmente.  Tomar medicamentos, como aspirina e ibuprofeno. Estos medicamentos pueden tener un efecto anticoagulante en la [place]. No tome estos medicamentos antes del procedimiento si el mdico le indica que no lo haga.  Haga planes para que una persona lo lleve de vuelta a su casa despus del procedimiento. PROCEDIMIENTO  Le administrarn un medicamento para ayudarlo a relajarse (sedante).  Le administrarn un medicamento que lo har dormir (anestesia general).  Se inflar el abdomen con un gas, lo que facilitar la  observacin.  Le practicarn incisiones pequeas en el abdomen.  A travs de las incisiones, se introducirn un laparoscopio y otros instrumentos pequeos en el abdomen.  Se puede tomar Truddie Coco de tejido de un rgano del abdomen para su anlisis.  Se retirarn los instrumentos del abdomen.  Se extraer el gas.  Las incisiones se cerrarn con puntos (suturas). DESPUS DEL PROCEDIMIENTO Le controlarn con frecuencia la presin arterial, la frecuencia cardaca, la frecuencia respiratoria y Retail buyer de oxgeno en la sangre hasta que haya desaparecido el efecto de los medicamentos administrados. Esta informacin no tiene Marine scientist el consejo del mdico. Asegrese de hacerle al mdico cualquier pregunta que tenga. Document Released: 01/28/2005 Document Revised: 02/18/2014 Document Reviewed: 09/10/2013 Elsevier Interactive Patient Education  2017 [employer]. Endometriosis (Endometriosis) La endometriosis es una enfermedad en la que el tejido que rodea al tero (endometrio) crece fuera de su ubicacin normal. El tejido puede crecer en muchos lugares cerca del tero, pero comnmente crece en los ovarios, las trompas de Falopio, la vagina o el intestino. Dado que el tero expulsa o desprende su revestimiento en cada ciclo menstrual, hay sangrado en el lugar donde se localiza el tejido endometrial. Esto puede causar dolor porque la sangre es irritante para los tejidos que no estn normalmente expuestos a Librarian, academic.  CAUSAS  Se desconoce la causa de la endometriosis.  SIGNOS Y SNTOMAS  A menudo, no hay sntomas. Cuando se presentan sntomas, estos pueden variar segn la ubicacin del tejido desplazado. Pueden ocurrir diversos sntomas en diferentes momentos. Aunque  los sntomas se producen principalmente durante el perodo menstrual de Dalene Seltzer, tambin pueden aparecer en la mitad del ciclo, y generalmente terminan con la menopausia. Algunas personas pueden pasar meses sin experimentar  ningn tipo de sntomas. Los sntomas pueden incluir:   Dolor abdominal o en la espalda.  Sangrado ms abundante durante los perodos [employer].  [place].  Dolor al defecar.  Infertilidad. DIAGNSTICO  El mdico le preguntar acerca de sus sntomas y le har un examen fsico. Se pueden realizar varios estudios, por ejemplo:   Anlisis de [place] y [place]. Estos se realizan para ayudar a Sport and exercise psychologist.  Ecografas. Este estudio se realiza para observar el tejido anormal.  Radiografa del recto (enema de bario).  Laparoscopia. En este procedimiento, se introduce en el abdomen un tubo delgado, que emite luz y tiene una pequea cmara en el extremo (laparoscopio). Esto ayuda a que su mdico observe el tejido anormal para confirmar el diagnstico. El mdico tambin puede tomar una pequea [place] de tejido anormal (biopsia) que encuentra. Posteriormente esta muestra de tejido se enva a un laboratorio para examinarlo con un microscopio. [place] y puede incluir lo siguiente:   Medicamentos para Best boy. Los antiinflamatorios no esteroides Dayna Ramus) son un tipo de analgsico que pueden ayudar a Best boy causado por la endometriosis.  Terapia hormonal. Cuando se use la terapia hormonal, se eliminan los perodos menstruales. Esto elimina la exposicin mensual a la sangre por el tejido endometrial desplazado.  Ciruga. Algunas veces puede hacerse una ciruga para extirpar el tejido endometrial anormal. Cuando los casos son graves, puede realizarse una ciruga para extirpar las trompas de [place], el tero y los ovarios (histerectoma). [place] todos los [employer] se lo haya indicado el mdico. No tome aspirina porque este medicamento puede aumentar el sangrado cuando no recibe terapia hormonal.  Evite actividades que produzcan dolor, incluida la actividad  sexual. SOLICITE ATENCIN MDICA SI:  Tiene dolor plvico durante los perodos menstruales y antes y despus de [place].  Siente dolor plvico [employer] perodos menstruales que empeora durante el perodo.  Experimenta dolor plvico durante la actividad sexual o despus de [place].  Siente dolor plvico al defecar u orinar, especialmente durante el perodo menstrual.  Tiene dificultad para quedar embarazada.  Tiene fiebre. SOLICITE ATENCIN MDICA DE INMEDIATO SI:   El dolor es intenso y no responde a los analgsicos.  Siente nuseas y vmitos intensos, o no puede [employer].  Tiene dolor que se limita a la parte inferior derecha del abdomen.  Presenta hinchazn o aumento del dolor en el abdomen.  Observa sangre en la materia fecal. ASEGRESE DE QUE:   Comprende estas instrucciones.  Controlar su afeccin.  Recibir ayuda de inmediato si no mejora o si empeora. Esta informacin no tiene Marine scientist el consejo del mdico. Asegrese de hacerle al mdico cualquier pregunta que tenga. Document Released: 01/28/2005 Document Revised: 05/22/2015 Elsevier Interactive Patient Education  2017 [employer].

## 2016-01-30 LAB — URINALYSIS W MICROSCOPIC + REFLEX CULTURE
BILIRUBIN URINE: NEGATIVE
Bacteria, UA: NONE SEEN [HPF]
CASTS: NONE SEEN [LPF]
Crystals: NONE SEEN [HPF]
Glucose, UA: NEGATIVE
HGB URINE DIPSTICK: NEGATIVE
KETONES UR: NEGATIVE
Leukocytes, UA: NEGATIVE
NITRITE: NEGATIVE
PH: 7 (ref 5.0–8.0)
Protein, ur: NEGATIVE
RBC / HPF: NONE SEEN RBC/HPF (ref <=2)
Specific Gravity, Urine: 1.014 (ref 1.001–1.035)
WBC UA: NONE SEEN WBC/HPF (ref <=5)
Yeast: NONE SEEN [HPF]

## 2016-01-31 ENCOUNTER — Other Ambulatory Visit: Payer: BLUE CROSS/BLUE SHIELD

## 2016-01-31 LAB — COMPREHENSIVE METABOLIC PANEL
ALBUMIN: 4 g/dL (ref 3.6–5.1)
ALT: 15 U/L (ref 6–29)
AST: 18 U/L (ref 10–30)
Alkaline Phosphatase: 51 U/L (ref 33–115)
BUN: 8 mg/dL (ref 7–25)
CALCIUM: 8.9 mg/dL (ref 8.6–10.2)
CHLORIDE: 107 mmol/L (ref 98–110)
CO2: 22 mmol/L (ref 20–31)
Creat: 0.54 mg/dL (ref 0.50–1.10)
Glucose, Bld: 81 mg/dL (ref 65–99)
POTASSIUM: 4.2 mmol/L (ref 3.5–5.3)
Sodium: 138 mmol/L (ref 135–146)
TOTAL PROTEIN: 6.2 g/dL (ref 6.1–8.1)
Total Bilirubin: 0.8 mg/dL (ref 0.2–1.2)

## 2016-01-31 LAB — CBC WITH DIFFERENTIAL/PLATELET
BASOS ABS: 52 {cells}/uL (ref 0–200)
Basophils Relative: 1 %
EOS ABS: 260 {cells}/uL (ref 15–500)
EOS PCT: 5 %
HEMATOCRIT: 39.4 % (ref 35.0–45.0)
HEMOGLOBIN: 13.2 g/dL (ref 11.7–15.5)
LYMPHS ABS: 1976 {cells}/uL (ref 850–3900)
Lymphocytes Relative: 38 %
MCH: 30.7 pg (ref 27.0–33.0)
MCHC: 33.5 g/dL (ref 32.0–36.0)
MCV: 91.6 fL (ref 80.0–100.0)
MONO ABS: 416 {cells}/uL (ref 200–950)
MPV: 10.5 fL (ref 7.5–12.5)
Monocytes Relative: 8 %
NEUTROS PCT: 48 %
Neutro Abs: 2496 cells/uL (ref 1500–7800)
Platelets: 260 10*3/uL (ref 140–400)
RBC: 4.3 MIL/uL (ref 3.80–5.10)
RDW: 13 % (ref 11.0–15.0)
WBC: 5.2 10*3/uL (ref 3.8–10.8)

## 2016-01-31 LAB — LIPID PANEL
CHOLESTEROL: 145 mg/dL (ref <200)
HDL: 55 mg/dL (ref >50)
LDL CALC: 80 mg/dL (ref <100)
TRIGLYCERIDES: 48 mg/dL (ref <150)
Total CHOL/HDL Ratio: 2.6 Ratio (ref <5.0)
VLDL: 10 mg/dL (ref <30)

## 2016-01-31 LAB — TSH: TSH: 1.23 mIU/L

## 2016-06-26 ENCOUNTER — Encounter: Payer: Self-pay | Admitting: Gynecology

## 2017-01-08 ENCOUNTER — Emergency Department (HOSPITAL_BASED_OUTPATIENT_CLINIC_OR_DEPARTMENT_OTHER)
Admission: EM | Admit: 2017-01-08 | Discharge: 2017-01-08 | Disposition: A | Discharge status: Home / Self Care | Payer: BLUE CROSS/BLUE SHIELD | Attending: Emergency Medicine | Admitting: Emergency Medicine

## 2017-01-08 ENCOUNTER — Encounter (HOSPITAL_BASED_OUTPATIENT_CLINIC_OR_DEPARTMENT_OTHER): Payer: Self-pay | Admitting: Emergency Medicine

## 2017-01-08 ENCOUNTER — Other Ambulatory Visit: Payer: Self-pay

## 2017-01-08 ENCOUNTER — Emergency Department (HOSPITAL_BASED_OUTPATIENT_CLINIC_OR_DEPARTMENT_OTHER): Payer: BLUE CROSS/BLUE SHIELD

## 2017-01-08 DIAGNOSIS — Y929 Unspecified place or not applicable: Secondary | ICD-10-CM | POA: Diagnosis not present

## 2017-01-08 DIAGNOSIS — K219 Gastro-esophageal reflux disease without esophagitis: Secondary | ICD-10-CM | POA: Insufficient documentation

## 2017-01-08 DIAGNOSIS — Y999 Unspecified external cause status: Secondary | ICD-10-CM | POA: Insufficient documentation

## 2017-01-08 DIAGNOSIS — T17308A Unspecified foreign body in larynx causing other injury, initial encounter: Secondary | ICD-10-CM | POA: Insufficient documentation

## 2017-01-08 DIAGNOSIS — Y939 Activity, unspecified: Secondary | ICD-10-CM | POA: Diagnosis not present

## 2017-01-08 DIAGNOSIS — Z91040 Latex allergy status: Secondary | ICD-10-CM | POA: Diagnosis not present

## 2017-01-08 DIAGNOSIS — Z79899 Other long term (current) drug therapy: Secondary | ICD-10-CM | POA: Diagnosis not present

## 2017-01-08 DIAGNOSIS — R0602 Shortness of breath: Secondary | ICD-10-CM | POA: Diagnosis present

## 2017-01-08 DIAGNOSIS — X58XXXA Exposure to other specified factors, initial encounter: Secondary | ICD-10-CM | POA: Insufficient documentation

## 2017-01-08 DIAGNOSIS — J45909 Unspecified asthma, uncomplicated: Secondary | ICD-10-CM | POA: Diagnosis not present

## 2017-01-08 LAB — CBC
HCT: 38.1 % (ref 36.0–46.0)
HEMOGLOBIN: 13.2 g/dL (ref 12.0–15.0)
MCH: 31.4 pg (ref 26.0–34.0)
MCHC: 34.6 g/dL (ref 30.0–36.0)
MCV: 90.5 fL (ref 78.0–100.0)
Platelets: 276 10*3/uL (ref 150–400)
RBC: 4.21 MIL/uL (ref 3.87–5.11)
RDW: 11.9 % (ref 11.5–15.5)
WBC: 8.2 10*3/uL (ref 4.0–10.5)

## 2017-01-08 LAB — BASIC METABOLIC PANEL
ANION GAP: 8 (ref 5–15)
BUN: 14 mg/dL (ref 6–20)
CO2: 26 mmol/L (ref 22–32)
Calcium: 9.6 mg/dL (ref 8.9–10.3)
Chloride: 103 mmol/L (ref 101–111)
Creatinine, Ser: 0.75 mg/dL (ref 0.44–1.00)
GFR calc Af Amer: >60 mL/min (ref >60)
GFR calc non Af Amer: >60 mL/min (ref >60)
GLUCOSE: 89 mg/dL (ref 65–99)
POTASSIUM: 3.4 mmol/L — AB (ref 3.5–5.1)
Sodium: 137 mmol/L (ref 135–145)

## 2017-01-08 LAB — TROPONIN I: Troponin I: <0.03 ng/mL (ref <0.03)

## 2017-01-08 MED ORDER — PANTOPRAZOLE SODIUM 40 MG IV SOLR
40.0000 mg | Freq: Once | INTRAVENOUS | Status: AC
  Administered 2017-01-08: 40 mg via INTRAVENOUS
  Filled 2017-01-08: qty 40

## 2017-01-08 MED ORDER — ALBUTEROL SULFATE HFA 108 (90 BASE) MCG/ACT IN AERS
2.0000 | INHALATION_SPRAY | RESPIRATORY_TRACT | Status: DC | PRN
  Administered 2017-01-08: 2 via RESPIRATORY_TRACT
  Filled 2017-01-08: qty 6.7

## 2017-01-08 MED ORDER — IPRATROPIUM-ALBUTEROL 0.5-2.5 (3) MG/3ML IN SOLN
3.0000 mL | Freq: Once | RESPIRATORY_TRACT | Status: AC
  Administered 2017-01-08: 3 mL via RESPIRATORY_TRACT
  Filled 2017-01-08: qty 3

## 2017-01-08 MED ORDER — PANTOPRAZOLE SODIUM 40 MG PO TBEC: 40.0000 mg | DELAYED_RELEASE_TABLET | Freq: Every day | ORAL | 0 refills | Status: DC

## 2017-01-08 NOTE — ED Notes (Signed)
Pt verbalizes understanding of d/c instructions and denies any further needs at this time. 

## 2017-01-08 NOTE — ED Notes (Signed)
Pt has had worsening SOB for the last two weeks.  She states she was able to take robitussin and felt better, but that tonight she started to feel worse around 2300 and c/o pain in the center and left chest that goes through to her back.  Pt recently flew from [place] almost three weeks ago.

## 2017-01-08 NOTE — ED Provider Notes (Signed)
[facility] Provider Note: Monica Spurling, MD, FACEP  CSN: [id number] MRN: [id number] ARRIVAL: 01/08/17 at [place]: [id number]   CHIEF COMPLAINT  Shortness of Breath   HISTORY OF PRESENT ILLNESS  01/08/17 2:35 AM Monica Phelps Monica Phelps is a 31 y.o. female with about a 3-week history of episodic shortness of breath.  These episodes last several hours at a time and she has had about 7 of them.    Her current episode began several hours ago.  Her most recent previous episode was a week ago.  Symptoms are moderate and are exacerbated by lying supine and improved by sitting upright.  There is some associated sharp central chest pain that radiates into her back which she rates as a 2 out of 10.  She has taken Robitussin without relief.  These episodes began after's international travel several weeks ago.  She denies fever, cough, nausea, vomiting or diarrhea.  She does report having some wheezing earlier yesterday but none presently.  Her dyspnea is mild at the present time and she is without pain.  She does have a history of GERD and is wondering if this may be responsible for her symptoms.   Past Medical History:  Diagnosis Date  . Anxiety    paniac  . Asthma   . Blood dyscrasia    pt states hemorrhaged with last period 12/14 and was hospitalized in [place]  . Complication of anesthesia   . Constipation   . Endometriosis   . Fibroid   . Gastritis   . GERD (gastroesophageal reflux disease)   . Headache(784.0)    migraines  . History of bleeding disorder as a child   . PONV (postoperative nausea and vomiting)   . Reflux   . UTI (lower urinary tract infection)     Past Surgical History:  Procedure Laterality Date  . DILATION AND CURETTAGE OF UTERUS    . DILATION AND CURETTAGE OF UTERUS N/A 03/03/2013   Procedure: DILATATION AND CURETTAGE;  Surgeon: Emily Filbert, MD;  Location: [facility];  Service: Gynecology;  Laterality: N/A;  . INDUCED ABORTION     at 5 months fetal  anomalies hemorrhaged afterwards  . LAPAROSCOPIC LYSIS OF ADHESIONS N/A 03/03/2013   Procedure: LAPAROSCOPIC LYSIS OF ADHESIONS;  Surgeon: Emily Filbert, MD;  Location: [facility];  Service: Gynecology;  Laterality: N/A;  . LAPAROSCOPY N/A 03/03/2013   Procedure: LAPAROSCOPY DIAGNOSTIC;  Surgeon: Emily Filbert, MD;  Location: [facility];  Service: Gynecology;  Laterality: N/A;  . MASS EXCISION Left 10/27/2013   Procedure: EXCISION  LEFT BREAST MASS;  Surgeon: Stark Klein, MD;  Location: [facility];  Service: General;  Laterality: Left;    Family History  Problem Relation Age of Onset  . Hypertension Mother   . Heart disease Mother   . Stroke Mother   . Hyperlipidemia Father   . Hypothyroidism Other        several fam members   . Alcohol abuse Neg Hx   . Arthritis Neg Hx   . Asthma Neg Hx   . Birth defects Neg Hx   . Cancer Neg Hx   . Depression Neg Hx   . COPD Neg Hx   . Diabetes Neg Hx   . Drug abuse Neg Hx   . Early death Neg Hx   . Hearing loss Neg Hx   . Kidney disease Neg Hx   . Learning disabilities Neg Hx   . Mental illness Neg  Hx   . Mental retardation Neg Hx   . Miscarriages / Stillbirths Neg Hx   . Vision loss Neg Hx     Social History   Tobacco Use  . Smoking status: Never Smoker  . Smokeless tobacco: Never Used  Substance Use Topics  . Alcohol use: No    Alcohol/week: 0.0 oz  . Drug use: No    Prior to Admission medications   Medication Sig Start Date End Date Taking? Authorizing Provider  ALPRAZolam (XANAX) 0.25 MG tablet Take 1 tablet (0.25 mg total) by mouth at bedtime as needed for anxiety. Patient not taking: Reported on 01/29/2016 07/06/15   Terrance Mass, MD  Ascorbic Acid (VITAMIN C PO) Take 1 tablet by mouth daily.    [provider]  CALCIUM PO Take 1 tablet by mouth daily.    [provider]  Cholecalciferol (VITAMIN D PO) Take 1 tablet by mouth daily.    [provider]  levonorgestrel-ethinyl estradiol  (AVIANE,ALESSE,LESSINA) 0.1-20 MG-MCG tablet Continous to withdraw every three months 01/29/16   Terrance Mass, MD  naproxen (NAPROSYN) 250 MG tablet Take 1 tablet (250 mg total) by mouth 2 (two) times daily as needed for mild pain or moderate pain (take with food). Patient not taking: Reported on 01/29/2016 10/28/14   Francine Graven, DO  spironolactone (ALDACTONE) 25 MG tablet Take 1 tablet (25 mg total) by mouth daily. Patient not taking: Reported on 01/29/2016 07/06/15   Terrance Mass, MD  VITAMIN E PO Take 1 tablet by mouth daily.    [provider]    Allergies Dilaudid [hydromorphone hcl]; Morphine and related; and Latex   REVIEW OF SYSTEMS  Negative except as noted here or in the History of Present Illness.   PHYSICAL EXAMINATION  Initial Vital Signs Blood pressure (!) 132/99, pulse 69, temperature 98.3 F (36.8 C), temperature source Oral, resp. rate 18, height 5\' 4"  (1.626 m), weight 67.1 kg (148 lb), last menstrual period 12/19/2016, SpO2 99 %.  Examination General: Well-developed, well-nourished female in no acute distress; appearance consistent with age of record HENT: normocephalic; atraumatic Eyes: pupils equal, round and reactive to light; extraocular muscles intact Neck: supple Heart: regular rate and rhythm Lungs: clear to auscultation bilaterally Chest: Nontender Abdomen: soft; nondistended; nontender; no masses or hepatosplenomegaly; bowel sounds present Extremities: No deformity; full range of motion; pulses normal Neurologic: Awake, alert and oriented; motor function intact in all extremities and symmetric; no facial droop Skin: Warm and dry Psychiatric: Normal mood and affect   RESULTS  Summary of this visit's results, reviewed by myself:   EKG Interpretation  Date/Time:  Wednesday January 08 2017 01:57:18 EST Ventricular Rate:  67 PR Interval:    QRS Duration: 86 QT Interval:  397 QTC Calculation: 420 R Axis:   75 Text  Interpretation:  Sinus rhythm Normal ECG No significant change was found Confirmed by Graceson Nichelson, Jenny Reichmann [phone]) on 01/08/2017 2:01:17 AM      Laboratory Studies: Results for orders placed or performed during the hospital encounter of 01/08/17 (from the past 24 hour(s))  Basic metabolic panel     Status: Abnormal   Collection Time: 01/08/17  2:05 AM  Result Value Ref Range   Sodium 137 135 - 145 mmol/L   Potassium 3.4 (L) 3.5 - 5.1 mmol/L   Chloride 103 101 - 111 mmol/L   CO2 26 22 - 32 mmol/L   Glucose, Bld 89 65 - 99 mg/dL   BUN 14 6 - 20  mg/dL   Creatinine, Ser 0.75 0.44 - 1.00 mg/dL   Calcium 9.6 8.9 - 10.3 mg/dL   GFR calc non Af Amer >60 >60 mL/min   GFR calc Af Amer >60 >60 mL/min   Anion gap 8 5 - 15  CBC     Status: None   Collection Time: 01/08/17  2:05 AM  Result Value Ref Range   WBC 8.2 4.0 - 10.5 K/uL   RBC 4.21 3.87 - 5.11 MIL/uL   Hemoglobin 13.2 12.0 - 15.0 g/dL   HCT 38.1 36.0 - 46.0 %   MCV 90.5 78.0 - 100.0 fL   MCH 31.4 26.0 - 34.0 pg   MCHC 34.6 30.0 - 36.0 g/dL   RDW 11.9 11.5 - 15.5 %   Platelets 276 150 - 400 K/uL  Troponin I     Status: None   Collection Time: 01/08/17  2:05 AM  Result Value Ref Range   Troponin I <0.03 <0.03 ng/mL   Imaging Studies: Dg Chest 2 View  Result Date: 01/08/2017 CLINICAL DATA:  Shortness of breath EXAM: CHEST  2 VIEW COMPARISON:  12/08/2006 FINDINGS: The heart size and mediastinal contours are within normal limits. Both lungs are clear. The visualized skeletal structures are unremarkable. IMPRESSION: No active cardiopulmonary disease. Electronically Signed   By: Donavan Foil M.D.   On: 01/08/2017 02:39    ED COURSE  Nursing notes and initial vitals signs, including pulse oximetry, reviewed.  Vitals:   01/08/17 0152 01/08/17 0153 01/08/17 0230 01/08/17 0314  BP: (!) 132/99  115/70   Pulse: 69  66   Resp: 18  16   Temp: 98.3 F (36.8 C)     TempSrc: Oral     SpO2: 99%  97% 98%  Weight:  67.1 kg (148 lb)      Height:  5\' 4"  (1.626 m)     3:35 AM Patient feeling much better after DuoNeb treatment and IV Protonix.  Suspect bronchospasm secondary to acid reflux.  PROCEDURES    ED DIAGNOSES     ICD-10-CM   1. Gastric reflux with aspiration K21.9    T17.308A        Thia Olesen, Jenny Reichmann, MD 01/08/17 [id number]

## 2017-01-08 NOTE — ED Triage Notes (Signed)
Pt reports shob tonight. Pt reports shob is worse with laying down. C/o sharp chest pain.

## 2017-03-31 DIAGNOSIS — R0602 Shortness of breath: Secondary | ICD-10-CM | POA: Diagnosis not present

## 2017-05-01 DIAGNOSIS — R102 Pelvic and perineal pain: Secondary | ICD-10-CM | POA: Diagnosis not present

## 2017-05-01 DIAGNOSIS — N939 Abnormal uterine and vaginal bleeding, unspecified: Secondary | ICD-10-CM | POA: Diagnosis not present

## 2017-05-01 DIAGNOSIS — N809 Endometriosis, unspecified: Secondary | ICD-10-CM | POA: Diagnosis not present

## 2017-05-04 DIAGNOSIS — J1089 Influenza due to other identified influenza virus with other manifestations: Secondary | ICD-10-CM | POA: Diagnosis not present

## 2017-05-14 DIAGNOSIS — N809 Endometriosis, unspecified: Secondary | ICD-10-CM | POA: Diagnosis not present

## 2017-05-14 DIAGNOSIS — R102 Pelvic and perineal pain: Secondary | ICD-10-CM | POA: Diagnosis not present

## 2017-05-14 DIAGNOSIS — N939 Abnormal uterine and vaginal bleeding, unspecified: Secondary | ICD-10-CM | POA: Diagnosis not present

## 2017-05-15 DIAGNOSIS — R232 Flushing: Secondary | ICD-10-CM | POA: Diagnosis not present

## 2017-05-24 DIAGNOSIS — A084 Viral intestinal infection, unspecified: Secondary | ICD-10-CM | POA: Diagnosis not present

## 2017-05-24 DIAGNOSIS — R1083 Colic: Secondary | ICD-10-CM | POA: Diagnosis not present

## 2017-05-24 DIAGNOSIS — E860 Dehydration: Secondary | ICD-10-CM | POA: Diagnosis not present

## 2017-07-22 DIAGNOSIS — N939 Abnormal uterine and vaginal bleeding, unspecified: Secondary | ICD-10-CM | POA: Diagnosis not present

## 2017-07-22 DIAGNOSIS — N921 Excessive and frequent menstruation with irregular cycle: Secondary | ICD-10-CM | POA: Diagnosis not present

## 2017-07-22 DIAGNOSIS — N83209 Unspecified ovarian cyst, unspecified side: Secondary | ICD-10-CM | POA: Diagnosis not present

## 2017-10-22 DIAGNOSIS — O418X11 Other specified disorders of amniotic fluid and membranes, first trimester, fetus 1: Secondary | ICD-10-CM | POA: Diagnosis not present

## 2017-10-22 DIAGNOSIS — O468X1 Other antepartum hemorrhage, first trimester: Secondary | ICD-10-CM | POA: Diagnosis not present

## 2017-10-22 DIAGNOSIS — O09291 Supervision of pregnancy with other poor reproductive or obstetric history, first trimester: Secondary | ICD-10-CM | POA: Diagnosis not present

## 2017-10-22 DIAGNOSIS — N883 Incompetence of cervix uteri: Secondary | ICD-10-CM | POA: Diagnosis not present

## 2017-10-27 ENCOUNTER — Inpatient Hospital Stay (HOSPITAL_COMMUNITY): Payer: BLUE CROSS/BLUE SHIELD

## 2017-10-27 ENCOUNTER — Other Ambulatory Visit: Payer: Self-pay

## 2017-10-27 ENCOUNTER — Encounter (HOSPITAL_COMMUNITY): Payer: Self-pay | Admitting: *Deleted

## 2017-10-27 ENCOUNTER — Inpatient Hospital Stay (HOSPITAL_COMMUNITY)
Admission: AD | Admit: 2017-10-27 | Discharge: 2017-10-27 | Disposition: A | Discharge status: Home / Self Care | Payer: BLUE CROSS/BLUE SHIELD | Source: Ambulatory Visit | Attending: Obstetrics and Gynecology | Admitting: Obstetrics and Gynecology

## 2017-10-27 DIAGNOSIS — Z3A01 Less than 8 weeks gestation of pregnancy: Secondary | ICD-10-CM | POA: Diagnosis not present

## 2017-10-27 DIAGNOSIS — R109 Unspecified abdominal pain: Secondary | ICD-10-CM | POA: Diagnosis not present

## 2017-10-27 DIAGNOSIS — J45909 Unspecified asthma, uncomplicated: Secondary | ICD-10-CM | POA: Insufficient documentation

## 2017-10-27 DIAGNOSIS — O99511 Diseases of the respiratory system complicating pregnancy, first trimester: Secondary | ICD-10-CM | POA: Diagnosis not present

## 2017-10-27 DIAGNOSIS — Z79899 Other long term (current) drug therapy: Secondary | ICD-10-CM | POA: Diagnosis not present

## 2017-10-27 DIAGNOSIS — O208 Other hemorrhage in early pregnancy: Secondary | ICD-10-CM | POA: Insufficient documentation

## 2017-10-27 DIAGNOSIS — O418X10 Other specified disorders of amniotic fluid and membranes, first trimester, not applicable or unspecified: Secondary | ICD-10-CM

## 2017-10-27 DIAGNOSIS — F419 Anxiety disorder, unspecified: Secondary | ICD-10-CM | POA: Diagnosis not present

## 2017-10-27 DIAGNOSIS — O99341 Other mental disorders complicating pregnancy, first trimester: Secondary | ICD-10-CM | POA: Insufficient documentation

## 2017-10-27 DIAGNOSIS — O26892 Other specified pregnancy related conditions, second trimester: Secondary | ICD-10-CM | POA: Diagnosis present

## 2017-10-27 DIAGNOSIS — O468X1 Other antepartum hemorrhage, first trimester: Secondary | ICD-10-CM

## 2017-10-27 DIAGNOSIS — O99611 Diseases of the digestive system complicating pregnancy, first trimester: Secondary | ICD-10-CM | POA: Diagnosis not present

## 2017-10-27 DIAGNOSIS — O26891 Other specified pregnancy related conditions, first trimester: Secondary | ICD-10-CM

## 2017-10-27 DIAGNOSIS — K219 Gastro-esophageal reflux disease without esophagitis: Secondary | ICD-10-CM | POA: Diagnosis not present

## 2017-10-27 DIAGNOSIS — Z885 Allergy status to narcotic agent status: Secondary | ICD-10-CM | POA: Insufficient documentation

## 2017-10-27 LAB — CBC WITH DIFFERENTIAL/PLATELET
BASOS PCT: 0 %
Basophils Absolute: 0 10*3/uL (ref 0.0–0.1)
EOS ABS: 0.2 10*3/uL (ref 0.0–0.7)
Eosinophils Relative: 2 %
HEMATOCRIT: 37 % (ref 36.0–46.0)
HEMOGLOBIN: 12.8 g/dL (ref 12.0–15.0)
Lymphocytes Relative: 37 %
Lymphs Abs: 3.6 10*3/uL (ref 0.7–4.0)
MCH: 31.4 pg (ref 26.0–34.0)
MCHC: 34.6 g/dL (ref 30.0–36.0)
MCV: 90.9 fL (ref 78.0–100.0)
MONOS PCT: 6 %
Monocytes Absolute: 0.6 10*3/uL (ref 0.1–1.0)
NEUTROS ABS: 5.3 10*3/uL (ref 1.7–7.7)
Neutrophils Relative %: 55 %
Platelets: 271 10*3/uL (ref 150–400)
RBC: 4.07 MIL/uL (ref 3.87–5.11)
RDW: 12.6 % (ref 11.5–15.5)
WBC: 9.7 10*3/uL (ref 4.0–10.5)

## 2017-10-27 LAB — COMPREHENSIVE METABOLIC PANEL
ALBUMIN: 3.8 g/dL (ref 3.5–5.0)
ALK PHOS: 44 U/L (ref 38–126)
ALT: 12 U/L (ref 0–44)
AST: 15 U/L (ref 15–41)
Anion gap: 7 (ref 5–15)
BILIRUBIN TOTAL: 0.8 mg/dL (ref 0.3–1.2)
BUN: 12 mg/dL (ref 6–20)
CALCIUM: 9.1 mg/dL (ref 8.9–10.3)
CO2: 25 mmol/L (ref 22–32)
CREATININE: 0.49 mg/dL (ref 0.44–1.00)
Chloride: 102 mmol/L (ref 98–111)
GFR calc Af Amer: >60 mL/min (ref >60)
GFR calc non Af Amer: >60 mL/min (ref >60)
GLUCOSE: 88 mg/dL (ref 70–99)
Potassium: 3.9 mmol/L (ref 3.5–5.1)
SODIUM: 134 mmol/L — AB (ref 135–145)
TOTAL PROTEIN: 6.4 g/dL — AB (ref 6.5–8.1)

## 2017-10-27 LAB — HCG, QUANTITATIVE, PREGNANCY: hCG, Beta Chain, Quant, S: 36023 m[IU]/mL — ABNORMAL HIGH (ref <5)

## 2017-10-27 LAB — URINALYSIS, ROUTINE W REFLEX MICROSCOPIC
Bilirubin Urine: NEGATIVE
GLUCOSE, UA: NEGATIVE mg/dL
HGB URINE DIPSTICK: NEGATIVE
KETONES UR: NEGATIVE mg/dL
Leukocytes, UA: NEGATIVE
Nitrite: NEGATIVE
PROTEIN: NEGATIVE mg/dL
Specific Gravity, Urine: 1.004 — ABNORMAL LOW (ref 1.005–1.030)
pH: 7 (ref 5.0–8.0)

## 2017-10-27 LAB — POCT PREGNANCY, URINE: Preg Test, Ur: POSITIVE — AB

## 2017-10-27 NOTE — MAU Note (Signed)
Been in pain since Friday, suprapubic area and RLQ- around to the back.  Had bleeding last night, "like when you start your period", none since.

## 2017-10-27 NOTE — MAU Provider Note (Signed)
History     CSN: [id number]  Arrival date and time: 10/27/17 1719   First Provider Initiated Contact with Patient 10/27/17 1801      Chief Complaint  Patient presents with  . Abdominal Pain  . Vaginal Bleeding  . Possible Pregnancy   HPI   Ms.Monica Phelps is a 32 y.o. female G2P0010 here in MAU with abdominal pain that started on Friday. The pain is worse in the RLQ. The pain comes and goes. She has never had this pain before, this is a new problem. She took one dose of ibuprofen on Saturday, which did help her pain. The pain is worse at night. No nausea, no vomiting. Last night she noticed a small amount of blood when she wiped. None today. The spotting was very dark in color.   History of 19 week delivery due to PPROM and large subchorionic hemorrhage.   OB History    Gravida  2   Para      Term      Preterm      AB  1   Living  0     SAB  1   TAB      Ectopic      Multiple      Live Births              Past Medical History:  Diagnosis Date  . Anxiety    paniac  . Asthma   . Blood dyscrasia    pt states hemorrhaged with last period 12/14 and was hospitalized in [place]  . Complication of anesthesia   . Constipation   . Endometriosis   . Fibroid   . Gastritis   . GERD (gastroesophageal reflux disease)   . Headache(784.0)    migraines  . History of bleeding disorder as a child   . PONV (postoperative nausea and vomiting)   . Reflux   . UTI (lower urinary tract infection)     Past Surgical History:  Procedure Laterality Date  . DILATION AND CURETTAGE OF UTERUS    . DILATION AND CURETTAGE OF UTERUS N/A 03/03/2013   Procedure: DILATATION AND CURETTAGE;  Surgeon: Emily Filbert, MD;  Location: [facility];  Service: Gynecology;  Laterality: N/A;  . INDUCED ABORTION     at 5 months fetal anomalies hemorrhaged afterwards  . LAPAROSCOPIC LYSIS OF ADHESIONS N/A 03/03/2013   Procedure: LAPAROSCOPIC LYSIS OF ADHESIONS;  Surgeon: Emily Filbert, MD;   Location: [facility];  Service: Gynecology;  Laterality: N/A;  . LAPAROSCOPY N/A 03/03/2013   Procedure: LAPAROSCOPY DIAGNOSTIC;  Surgeon: Emily Filbert, MD;  Location: [facility];  Service: Gynecology;  Laterality: N/A;  . MASS EXCISION Left 10/27/2013   Procedure: EXCISION  LEFT BREAST MASS;  Surgeon: Stark Klein, MD;  Location: [facility];  Service: General;  Laterality: Left;    Family History  Problem Relation Age of Onset  . Hypertension Mother   . Heart disease Mother   . Stroke Mother   . Hyperlipidemia Father   . Hypothyroidism Other        several fam members   . Alcohol abuse Neg Hx   . Arthritis Neg Hx   . Asthma Neg Hx   . Birth defects Neg Hx   . Cancer Neg Hx   . Depression Neg Hx   . COPD Neg Hx   . Diabetes Neg Hx   . Drug abuse Neg Hx   . Early death Neg Hx   .  Hearing loss Neg Hx   . Kidney disease Neg Hx   . Learning disabilities Neg Hx   . Mental illness Neg Hx   . Mental retardation Neg Hx   . Miscarriages / Stillbirths Neg Hx   . Vision loss Neg Hx     Social History   Tobacco Use  . Smoking status: Never Smoker  . Smokeless tobacco: Never Used  Substance Use Topics  . Alcohol use: No    Alcohol/week: 0.0 standard drinks  . Drug use: No    Allergies:  Allergies  Allergen Reactions  . Dilaudid [Hydromorphone Hcl] Anaphylaxis and Nausea And Vomiting  . Morphine And Related Shortness Of Breath and Nausea And Vomiting  . Latex Itching    Medications Prior to Admission  Medication Sig Dispense Refill Last Dose  . Ascorbic Acid (VITAMIN C PO) Take 1 tablet by mouth daily.   Not Taking  . CALCIUM PO Take 1 tablet by mouth daily.   Taking  . Cholecalciferol (VITAMIN D PO) Take 1 tablet by mouth daily.   Not Taking  . levonorgestrel-ethinyl estradiol (AVIANE,ALESSE,LESSINA) 0.1-20 MG-MCG tablet Continous to withdraw every three months 3 Package 4   . pantoprazole (PROTONIX) 40 MG tablet Take 1 tablet (40 mg total) by mouth daily. 30 tablet  0   . VITAMIN E PO Take 1 tablet by mouth daily.   Taking   Results for orders placed or performed during the hospital encounter of 10/27/17 (from the past 48 hour(s))  Urinalysis, Routine w reflex microscopic     Status: Abnormal   Collection Time: 10/27/17  5:46 PM  Result Value Ref Range   Color, Urine COLORLESS (A) YELLOW   APPearance CLEAR CLEAR   Specific Gravity, Urine 1.004 (L) 1.005 - 1.030   pH 7.0 5.0 - 8.0   Glucose, UA NEGATIVE NEGATIVE mg/dL   Hgb urine dipstick NEGATIVE NEGATIVE   Bilirubin Urine NEGATIVE NEGATIVE   Ketones, ur NEGATIVE NEGATIVE mg/dL   Protein, ur NEGATIVE NEGATIVE mg/dL   Nitrite NEGATIVE NEGATIVE   Leukocytes, UA NEGATIVE NEGATIVE    Comment: Performed at [facility], [address], [place], [place] [address]  Pregnancy, urine POC     Status: Abnormal   Collection Time: 10/27/17  5:49 PM  Result Value Ref Range   Preg Test, Ur POSITIVE (A) NEGATIVE    Comment:        THE SENSITIVITY OF THIS METHODOLOGY IS >24 mIU/mL   CBC with Differential/Platelet     Status: None   Collection Time: 10/27/17  6:28 PM  Result Value Ref Range   WBC 9.7 4.0 - 10.5 K/uL   RBC 4.07 3.87 - 5.11 MIL/uL   Hemoglobin 12.8 12.0 - 15.0 g/dL   HCT 37.0 36.0 - 46.0 %   MCV 90.9 78.0 - 100.0 fL   MCH 31.4 26.0 - 34.0 pg   MCHC 34.6 30.0 - 36.0 g/dL   RDW 12.6 11.5 - 15.5 %   Platelets 271 150 - 400 K/uL   Neutrophils Relative % 55 %   Neutro Abs 5.3 1.7 - 7.7 K/uL   Lymphocytes Relative 37 %   Lymphs Abs 3.6 0.7 - 4.0 K/uL   Monocytes Relative 6 %   Monocytes Absolute 0.6 0.1 - 1.0 K/uL   Eosinophils Relative 2 %   Eosinophils Absolute 0.2 0.0 - 0.7 K/uL   Basophils Relative 0 %   Basophils Absolute 0.0 0.0 - 0.1 K/uL    Comment: Performed at [facility], [place], [place] [address]  Comprehensive metabolic panel     Status: Abnormal   Collection Time: 10/27/17  6:28 PM  Result Value Ref Range   Sodium 134 (L) 135 - 145 mmol/L    Potassium 3.9 3.5 - 5.1 mmol/L   Chloride 102 98 - 111 mmol/L   CO2 25 22 - 32 mmol/L   Glucose, Bld 88 70 - 99 mg/dL   BUN 12 6 - 20 mg/dL   Creatinine, Ser 0.49 0.44 - 1.00 mg/dL   Calcium 9.1 8.9 - 10.3 mg/dL   Total Protein 6.4 (L) 6.5 - 8.1 g/dL   Albumin 3.8 3.5 - 5.0 g/dL   AST 15 15 - 41 U/L   ALT 12 0 - 44 U/L   Alkaline Phosphatase 44 38 - 126 U/L   Total Bilirubin 0.8 0.3 - 1.2 mg/dL   GFR calc non Af Amer >60 >60 mL/min   GFR calc Af Amer >60 >60 mL/min    Comment: (NOTE) The eGFR has been calculated using the CKD EPI equation. This calculation has not been validated in all clinical situations. eGFR's persistently <60 mL/min signify possible Chronic Kidney Disease.    Anion gap 7 5 - 15    Comment: Performed at [facility], [address], [place], [place] [address]  hCG, quantitative, pregnancy     Status: Abnormal   Collection Time: 10/27/17  6:28 PM  Result Value Ref Range   hCG, Beta Chain, Quant, S 36,023 (H) <5 mIU/mL    Comment:          GEST. AGE      CONC.  (mIU/mL)   <=1 WEEK        5 - 50     2 WEEKS       50 - 500     3 WEEKS       100 - 10,000     4 WEEKS     1,000 - 30,000     5 WEEKS     3,500 - 115,000   6-8 WEEKS     12,000 - 270,000    12 WEEKS     15,000 - 220,000        FEMALE AND NON-PREGNANT FEMALE:     LESS THAN 5 mIU/mL Performed at [facility], [address], [place],  [address]    US Ob Less Than 14 Weeks With Ob Transvaginal  Result Date: 10/27/2017 CLINICAL DATA:  32 year old female with abdominal pain in the 1st trimester of pregnancy. Quantitative beta HCG pending. Estimated gestational age by LMP 6 weeks 0 days. EXAM: OBSTETRIC <14 WK [place] AND TRANSVAGINAL OB US TECHNIQUE: Both transabdominal and transvaginal ultrasound examinations were performed for complete evaluation of the gestation as well as the maternal uterus, adnexal regions, and pelvic cul-de-sac. Transvaginal technique was performed to assess early  pregnancy. COMPARISON:  No relevant Ob ultrasound. CT Abdomen and Pelvis 10/28/2014. FINDINGS: Intrauterine gestational sac: Single Yolk sac:  Visible Embryo:  Visible Cardiac Activity: Detected Heart Rate: 102 bpm CRL:  3.6 mm   6 w   0 d                  [place] EDC: 06/22/2018 Subchorionic hemorrhage: Small volume encompassing 18 x 13 x 19 millimeters (image 50). Maternal uterus/adnexae: Normal left ovary measuring 3.8 x 1.8 x 2.0 centimeters. Normal right ovary measuring 3.4 x 1.3 x 2.6 centimeters. No pelvic free fluid. IMPRESSION: Single IUP demonstrated  with small volume subchorionic hemorrhage. Electronically Signed   By: Genevie Ann M.D.   On: 10/27/2017 19:36     Review of Systems  Constitutional: Positive for chills. Negative for fever.  Gastrointestinal: Positive for abdominal pain.  Genitourinary: Positive for vaginal bleeding. Negative for vaginal discharge.   Physical Exam   Blood pressure 119/62, pulse 75, temperature 98.5 F (36.9 C), temperature source Oral, resp. rate 17, height _0  (1.626 m), weight 65 kg, last menstrual period 09/15/2017, SpO2 99 %.  Physical Exam  Constitutional: She is oriented to person, place, and time. She appears well-developed and well-nourished. No distress.  HENT:  Head: Normocephalic.  Eyes: Pupils are equal, round, and reactive to light.  GI: Soft. There is tenderness in the right lower quadrant, periumbilical area and suprapubic area. There is no rigidity, no rebound and no guarding.  Musculoskeletal: Normal range of motion.  Neurological: She is alert and oriented to person, place, and time.  Skin: Skin is warm. She is not diaphoretic.  Psychiatric: Her behavior is normal.   MAU Course  Procedures  None  MDM  HIV, CBC, Hcg, ABO US OB transvaginal  A positive blood type Discussed patient with Dr.  Charlesetta Garibaldi, discussed labs and [place]. [facility] for DC home.   Assessment and Plan   A:  1. Subchorionic hematoma in first trimester, single or  unspecified fetus   2. [redacted] weeks gestation of pregnancy   3. Abdominal pain during pregnancy in first trimester     P:  Discharge home in stable condition F/U with your appointment on Weds with Dr. Landry Mellow Pelvic rest Bleeding precautions Return to MAU if symptoms worsen   Alishba Naples, Artist Pais, NP 10/27/2017 8:24 PM

## 2017-10-27 NOTE — Discharge Instructions (Signed)
Dolor abdominal durante el embarazo (Abdominal Pain During Pregnancy) El dolor de vientre (abdominal) es habitual durante el embarazo. Generalmente no se trata de un problema grave. Otras veces puede ser un signo de que algo no anda bien. Siempre comunquese con su mdico si tiene dolor abdominal. CUIDADOS EN EL HOGAR Controle el dolor para ver si hay cambios. Las indicaciones que siguen pueden ayudarla a sentirse mejor:  Optician, dispensing (relaciones sexuales) ni se coloque nada dentro de la vagina hasta que se sienta mejor.  Haga reposo hasta que el dolor se calme.  Si siente ganas de vomitar (nuseas ) beba lquidos claros. No consuma alimentos slidos hasta que se sienta mejor.  Slo tome los medicamentos que le haya indicado su mdico.  Cumpla con las visitas al mdico segn las indicaciones. SOLICITE AYUDA DE INMEDIATO SI:  Tiene un sangrado, pierde lquido o elimina trozos de tejido por la vagina.  Siente ms dolor o clicos.  Comienza a vomitar.  Siente dolor al orinar u observa sangre en la orina.  Tiene fiebre.  No siente que el beb se mueva mucho.  Se siente muy dbil o cree que va a desmayarse.  Tiene dificultad para respirar con o sin dolor en el vientre.  Siente un dolor de cabeza muy intenso y Social research officer, government en el vientre.  Observa que sale un lquido por la vagina y tiene dolor abdominal.  La materia fecal es lquida (diarrea).  El dolor en el viente no desaparece, o empeora, luego de hacer reposo. ASEGRESE DE QUE:  Comprende estas instrucciones.  Controlar su afeccin.  Recibir ayuda de inmediato si no mejora o si empeora. Esta informacin no tiene Marine scientist el consejo del mdico. Asegrese de hacerle al mdico cualquier pregunta que tenga. Document Released: 10/10/2010 Document Revised: 05/22/2015 Document Reviewed: 08/27/2012 Elsevier Interactive Patient Education  2018 [place] Hematoma A subchorionic hematoma is a  gathering of blood between the outer wall of the placenta and the inner wall of the womb (uterus). The placenta is the organ that connects the fetus to the wall of the uterus. The placenta performs the feeding, breathing (oxygen to the fetus), and waste removal (excretory work) of the fetus. Subchorionic hematoma is the most common abnormality found on a result from ultrasonography done during the first trimester or early second trimester of pregnancy. If there has been little or no vaginal bleeding, early small hematomas usually shrink on their own and do not affect your baby or pregnancy. The blood is gradually absorbed over 1-2 weeks. When bleeding starts later in pregnancy or the hematoma is larger or occurs in an older pregnant woman, the outcome may not be as good. Larger hematomas may get bigger, which increases the chances for miscarriage. Subchorionic hematoma also increases the risk of premature detachment of the placenta from the uterus, preterm (premature) labor, and stillbirth. Follow these instructions at home:  Stay on bed rest if your health care provider recommends this. Although bed rest will not prevent more bleeding or prevent a miscarriage, your health care provider may recommend bed rest until you are advised otherwise.  Avoid heavy lifting (more than 10 lb [4.5 kg]), exercise, sexual intercourse, or douching as directed by your health care provider.  Keep track of the number of pads you use each day and how soaked (saturated) they are. Write down this information.  Do not use tampons.  Keep all follow-up appointments as directed by your health care provider. Your health care provider may ask  you to have follow-up blood tests or ultrasound tests or both. Get help right away if:  You have severe cramps in your stomach, back, abdomen, or pelvis.  You have a fever.  You pass large clots or tissue. Save any tissue for your health care provider to look at.  Your bleeding  increases or you become lightheaded, feel weak, or have fainting episodes. This information is not intended to replace advice given to you by your health care provider. Make sure you discuss any questions you have with your health care provider. Document Released: 05/15/2006 Document Revised: 07/06/2015 Document Reviewed: 08/27/2012 Elsevier Interactive Patient Education  2017 [employer].

## 2017-10-29 DIAGNOSIS — Z3201 Encounter for pregnancy test, result positive: Secondary | ICD-10-CM | POA: Diagnosis not present

## 2017-10-29 DIAGNOSIS — O468X1 Other antepartum hemorrhage, first trimester: Secondary | ICD-10-CM | POA: Diagnosis not present

## 2017-10-29 DIAGNOSIS — Z3480 Encounter for supervision of other normal pregnancy, unspecified trimester: Secondary | ICD-10-CM | POA: Diagnosis not present

## 2017-10-29 DIAGNOSIS — O09291 Supervision of pregnancy with other poor reproductive or obstetric history, first trimester: Secondary | ICD-10-CM | POA: Diagnosis not present

## 2017-10-29 DIAGNOSIS — O418X11 Other specified disorders of amniotic fluid and membranes, first trimester, fetus 1: Secondary | ICD-10-CM | POA: Diagnosis not present

## 2017-10-29 DIAGNOSIS — N883 Incompetence of cervix uteri: Secondary | ICD-10-CM | POA: Diagnosis not present

## 2017-10-29 DIAGNOSIS — Z3481 Encounter for supervision of other normal pregnancy, first trimester: Secondary | ICD-10-CM | POA: Diagnosis not present

## 2017-11-05 DIAGNOSIS — O418X11 Other specified disorders of amniotic fluid and membranes, first trimester, fetus 1: Secondary | ICD-10-CM | POA: Diagnosis not present

## 2017-11-05 DIAGNOSIS — O209 Hemorrhage in early pregnancy, unspecified: Secondary | ICD-10-CM | POA: Diagnosis not present

## 2017-11-05 DIAGNOSIS — N883 Incompetence of cervix uteri: Secondary | ICD-10-CM | POA: Diagnosis not present

## 2017-11-05 DIAGNOSIS — O468X1 Other antepartum hemorrhage, first trimester: Secondary | ICD-10-CM | POA: Diagnosis not present

## 2017-11-05 DIAGNOSIS — O09291 Supervision of pregnancy with other poor reproductive or obstetric history, first trimester: Secondary | ICD-10-CM | POA: Diagnosis not present

## 2017-11-13 ENCOUNTER — Encounter (HOSPITAL_COMMUNITY): Payer: Self-pay

## 2017-11-13 ENCOUNTER — Inpatient Hospital Stay (HOSPITAL_COMMUNITY)
Admission: AD | Admit: 2017-11-13 | Discharge: 2017-11-14 | Disposition: A | Discharge status: Home / Self Care | Payer: BLUE CROSS/BLUE SHIELD | Source: Ambulatory Visit | Attending: Obstetrics & Gynecology | Admitting: Obstetrics & Gynecology

## 2017-11-13 DIAGNOSIS — F419 Anxiety disorder, unspecified: Secondary | ICD-10-CM | POA: Diagnosis not present

## 2017-11-13 DIAGNOSIS — Z885 Allergy status to narcotic agent status: Secondary | ICD-10-CM | POA: Diagnosis not present

## 2017-11-13 DIAGNOSIS — K219 Gastro-esophageal reflux disease without esophagitis: Secondary | ICD-10-CM | POA: Diagnosis not present

## 2017-11-13 DIAGNOSIS — O208 Other hemorrhage in early pregnancy: Secondary | ICD-10-CM | POA: Insufficient documentation

## 2017-11-13 DIAGNOSIS — O200 Threatened abortion: Secondary | ICD-10-CM | POA: Diagnosis not present

## 2017-11-13 DIAGNOSIS — J45909 Unspecified asthma, uncomplicated: Secondary | ICD-10-CM | POA: Diagnosis not present

## 2017-11-13 DIAGNOSIS — O26891 Other specified pregnancy related conditions, first trimester: Secondary | ICD-10-CM | POA: Diagnosis not present

## 2017-11-13 DIAGNOSIS — O418X10 Other specified disorders of amniotic fluid and membranes, first trimester, not applicable or unspecified: Secondary | ICD-10-CM

## 2017-11-13 DIAGNOSIS — R109 Unspecified abdominal pain: Secondary | ICD-10-CM | POA: Insufficient documentation

## 2017-11-13 DIAGNOSIS — Z3A08 8 weeks gestation of pregnancy: Secondary | ICD-10-CM | POA: Diagnosis not present

## 2017-11-13 DIAGNOSIS — O99611 Diseases of the digestive system complicating pregnancy, first trimester: Secondary | ICD-10-CM | POA: Diagnosis not present

## 2017-11-13 DIAGNOSIS — O99341 Other mental disorders complicating pregnancy, first trimester: Secondary | ICD-10-CM | POA: Insufficient documentation

## 2017-11-13 DIAGNOSIS — Z79899 Other long term (current) drug therapy: Secondary | ICD-10-CM | POA: Insufficient documentation

## 2017-11-13 DIAGNOSIS — O209 Hemorrhage in early pregnancy, unspecified: Secondary | ICD-10-CM

## 2017-11-13 DIAGNOSIS — O99511 Diseases of the respiratory system complicating pregnancy, first trimester: Secondary | ICD-10-CM | POA: Diagnosis not present

## 2017-11-13 DIAGNOSIS — O468X1 Other antepartum hemorrhage, first trimester: Secondary | ICD-10-CM

## 2017-11-13 MED ORDER — NALBUPHINE HCL 10 MG/ML IJ SOLN
5.0000 mg | INTRAMUSCULAR | Status: DC | PRN
  Administered 2017-11-13: 5 mg via INTRAMUSCULAR
  Filled 2017-11-13: qty 1

## 2017-11-13 NOTE — MAU Note (Signed)
At 2130 had a gush of blood and passed 1 quarter sized clot.  Continuing to have bleeding and started having pain on the way over here.  Hasn't taken anything for the pain.

## 2017-11-14 ENCOUNTER — Inpatient Hospital Stay (HOSPITAL_COMMUNITY): Payer: BLUE CROSS/BLUE SHIELD

## 2017-11-14 DIAGNOSIS — Z3A08 8 weeks gestation of pregnancy: Secondary | ICD-10-CM | POA: Diagnosis not present

## 2017-11-14 DIAGNOSIS — O200 Threatened abortion: Secondary | ICD-10-CM

## 2017-11-14 DIAGNOSIS — O208 Other hemorrhage in early pregnancy: Secondary | ICD-10-CM | POA: Diagnosis not present

## 2017-11-14 LAB — URINALYSIS, ROUTINE W REFLEX MICROSCOPIC

## 2017-11-14 LAB — URINALYSIS, MICROSCOPIC (REFLEX)
Bacteria, UA: NONE SEEN
RBC / HPF: >50 RBC/hpf (ref 0–5)
SQUAMOUS EPITHELIAL / LPF: NONE SEEN (ref 0–5)
WBC, UA: NONE SEEN WBC/hpf (ref 0–5)

## 2017-11-14 MED ORDER — OXYCODONE-ACETAMINOPHEN 5-325 MG PO TABS
1.0000 | ORAL_TABLET | Freq: Once | ORAL | Status: AC
  Administered 2017-11-14: 1 via ORAL
  Filled 2017-11-14: qty 1

## 2017-11-14 MED ORDER — PROMETHAZINE HCL 25 MG PO TABS
25.0000 mg | ORAL_TABLET | Freq: Once | ORAL | Status: AC
  Administered 2017-11-14: 25 mg via ORAL
  Filled 2017-11-14: qty 1

## 2017-11-14 NOTE — MAU Note (Signed)
Pt states her reaction to dilaudid was vomiting, shaking, and nausea when she received it at a prior encounter.

## 2017-11-14 NOTE — MAU Provider Note (Signed)
Chief Complaint: Abdominal Pain and Vaginal Bleeding Pt reports a h/o bleeding this pregnancy which became worse earlier this evening and was associated with increased cramping.      SUBJECTIVE HPI: Monica Phelps is a 32 y.o. [id number] at [redacted]w[redacted]d by LMP who presents to maternity admissions reporting gush of blood tonight.  Has pelvic pain as well, requesting medication. . She denies vaginal itching/burning, urinary symptoms, h/a, dizziness, n/v, or fever/chills.    Was seen on 10/27/17 for early pregnancy evaluation and was found to have a live 6 week fetus, intrauterine.  Has a history of losing a 19 week delivery due to PPROM and large subchorionic hematoma  HPI  RN Note: At 2130 had a gush of blood and passed 1 quarter sized clot.  Continuing to have bleeding and started having pain on the way over here.  Hasn't taken anything for the pain.  Past Medical History:  Diagnosis Date  . Anxiety    paniac  . Asthma   . Blood dyscrasia    pt states hemorrhaged with last period 12/14 and was hospitalized in [place]  . Complication of anesthesia   . Constipation   . Endometriosis   . Fibroid   . Gastritis   . GERD (gastroesophageal reflux disease)   . Headache(784.0)    migraines  . History of bleeding disorder as a child   . PONV (postoperative nausea and vomiting)   . Reflux   . UTI (lower urinary tract infection)    Past Surgical History:  Procedure Laterality Date  . DILATION AND CURETTAGE OF UTERUS    . DILATION AND CURETTAGE OF UTERUS N/A 03/03/2013   Procedure: DILATATION AND CURETTAGE;  Surgeon: Emily Filbert, MD;  Location: [facility];  Service: Gynecology;  Laterality: N/A;  . INDUCED ABORTION     at 5 months fetal anomalies hemorrhaged afterwards  . LAPAROSCOPIC LYSIS OF ADHESIONS N/A 03/03/2013   Procedure: LAPAROSCOPIC LYSIS OF ADHESIONS;  Surgeon: Emily Filbert, MD;  Location: [facility];  Service: Gynecology;  Laterality: N/A;  . LAPAROSCOPY N/A 03/03/2013   Procedure:  LAPAROSCOPY DIAGNOSTIC;  Surgeon: Emily Filbert, MD;  Location: [facility];  Service: Gynecology;  Laterality: N/A;  . MASS EXCISION Left 10/27/2013   Procedure: EXCISION  LEFT BREAST MASS;  Surgeon: Stark Klein, MD;  Location: [facility];  Service: General;  Laterality: Left;   Social History   Socioeconomic History  . Marital status: Married    Spouse name: Not on file  . Number of children: 0  . Years of education: Not on file  . Highest education level: Not on file  Occupational History  . Occupation: stay home  Social Needs  . Financial resource strain: Not on file  . Food insecurity:    Worry: Not on file    Inability: Not on file  . Transportation needs:    Medical: Not on file    Non-medical: Not on file  Tobacco Use  . Smoking status: Never Smoker  . Smokeless tobacco: Never Used  Substance and Sexual Activity  . Alcohol use: No    Alcohol/week: 0.0 standard drinks  . Drug use: No  . Sexual activity: Yes    Partners: Male    Birth control/protection: Condom    Comment: one week ago  Lifestyle  . Physical activity:    Days per week: Not on file    Minutes per session: Not on file  . Stress: Not on file  Relationships  .  Social connections:    Talks on phone: Not on file    Gets together: Not on file    Attends religious service: Not on file    Active member of club or organization: Not on file    Attends meetings of clubs or organizations: Not on file    Relationship status: Not on file  . Intimate partner violence:    Fear of current or ex partner: Not on file    Emotionally abused: Not on file    Physically abused: Not on file    Forced sexual activity: Not on file  Other Topics Concern  . Not on file  Social History Narrative   Original from [place]   No current facility-administered medications on file prior to encounter.    Current Outpatient Medications on File Prior to Encounter  Medication Sig Dispense Refill  . Ascorbic Acid  (VITAMIN C PO) Take 1 tablet by mouth daily.    Marland Kitchen CALCIUM PO Take 1 tablet by mouth daily.    . Cholecalciferol (VITAMIN D PO) Take 1 tablet by mouth daily.    . pantoprazole (PROTONIX) 40 MG tablet Take 1 tablet (40 mg total) by mouth daily. 30 tablet 0  . VITAMIN E PO Take 1 tablet by mouth daily.     Allergies  Allergen Reactions  . Dilaudid [Hydromorphone Hcl] Anaphylaxis and Nausea And Vomiting  . Morphine And Related Shortness Of Breath and Nausea And Vomiting  . Latex Itching    I have reviewed patient's Past Medical Hx, Surgical Hx, Family Hx, Social Hx, medications and allergies.   ROS:  Other systems negative   Physical Exam  Physical Exam Patient Vitals for the past 24 hrs:  BP Temp Pulse Resp Height Weight  11/13/17 2301 - - - - 5\' 4"  (1.626 m) 63 kg  11/13/17 2256 119/70 98.4 F (36.9 C) 69 19 - -   Constitutional: Well-developed, well-nourished female in no acute distress.  Cardiovascular: normal rate Respiratory: normal effort GI: Abd soft, non-tender.  MS: Extremities nontender, no edema, normal ROM Neurologic: Alert and oriented x 4.  GU: Neg CVAT.  PELVIC EXAM: Cervix very posterior and closed. Large amount of old blood in vault. Bimanual exam: Cervix 0/long/high, firm, anterior, neg CMT, uterus nontender, nonenlarged, adnexa without tenderness, enlargement, or mass   LAB RESULTS  Blood type A+  Results for orders placed or performed during the hospital encounter of 11/13/17 (from the past 24 hour(s))  Urinalysis, Routine w reflex microscopic     Status: Abnormal   Collection Time: 11/13/17 11:33 PM  Result Value Ref Range   Color, Urine RED (A) YELLOW   APPearance CLOUDY (A) CLEAR   Specific Gravity, Urine  1.005 - 1.030    TEST NOT REPORTED DUE TO COLOR INTERFERENCE OF URINE PIGMENT   pH  5.0 - 8.0    TEST NOT REPORTED DUE TO COLOR INTERFERENCE OF URINE PIGMENT   Glucose, UA (A) NEGATIVE mg/dL    TEST NOT REPORTED DUE TO COLOR INTERFERENCE OF  URINE PIGMENT   Hgb urine dipstick (A) NEGATIVE    TEST NOT REPORTED DUE TO COLOR INTERFERENCE OF URINE PIGMENT   Bilirubin Urine (A) NEGATIVE    TEST NOT REPORTED DUE TO COLOR INTERFERENCE OF URINE PIGMENT   Ketones, ur (A) NEGATIVE mg/dL    TEST NOT REPORTED DUE TO COLOR INTERFERENCE OF URINE PIGMENT   Protein, ur (A) NEGATIVE mg/dL    TEST NOT REPORTED DUE TO COLOR INTERFERENCE OF  URINE PIGMENT   Nitrite (A) NEGATIVE    TEST NOT REPORTED DUE TO COLOR INTERFERENCE OF URINE PIGMENT   Leukocytes, UA (A) NEGATIVE    TEST NOT REPORTED DUE TO COLOR INTERFERENCE OF URINE PIGMENT  Urinalysis, Microscopic (reflex)     Status: None   Collection Time: 11/13/17 11:33 PM  Result Value Ref Range   RBC / HPF >50 0 - 5 RBC/hpf   WBC, UA NONE SEEN 0 - 5 WBC/hpf   Bacteria, UA NONE SEEN NONE SEEN   Squamous Epithelial / LPF NONE SEEN 0 - 5      IMAGING US Ob Transvaginal  Result Date: 11/14/2017 CLINICAL DATA:  32 y/o F; heavy vaginal bleeding and pain. Follow-up for viability. EXAM: TRANSVAGINAL OB ULTRASOUND TECHNIQUE: Transvaginal ultrasound was performed for complete evaluation of the gestation as well as the maternal uterus, adnexal regions, and pelvic cul-de-sac. COMPARISON:  10/27/2017 pelvic ultrasound. FINDINGS: Intrauterine gestational sac: Single Yolk sac:  Visualized. Embryo:  Visualized. Cardiac Activity: Visualized. Heart Rate: 171 bpm CRL:   20.3 mm   8 w 4 d                  [place] EDC: 06/22/2018 Subchorionic hemorrhage: Large subchorionic hemorrhage measuring 5.8 x 1.5 x 4.6 cm. Maternal uterus/adnexae: Normal. IMPRESSION: 1. Single live intrauterine pregnancy with expected interval growth. 2. Large subchorionic hemorrhage is increased in size. Electronically Signed   By: Kristine Garbe M.D.   On: 11/14/2017 00:58   US Ob Less Than 14 Weeks With Ob Transvaginal  Result Date: 10/27/2017 CLINICAL DATA:  32 year old female with abdominal pain in the 1st trimester of pregnancy.  Quantitative beta HCG pending. Estimated gestational age by LMP 6 weeks 0 days. EXAM: OBSTETRIC <14 WK [place] AND TRANSVAGINAL OB US TECHNIQUE: Both transabdominal and transvaginal ultrasound examinations were performed for complete evaluation of the gestation as well as the maternal uterus, adnexal regions, and pelvic cul-de-sac. Transvaginal technique was performed to assess early pregnancy. COMPARISON:  No relevant Ob ultrasound. CT Abdomen and Pelvis 10/28/2014. FINDINGS: Intrauterine gestational sac: Single Yolk sac:  Visible Embryo:  Visible Cardiac Activity: Detected Heart Rate: 102 bpm CRL:  3.6 mm   6 w   0 d                  [place] EDC: 06/22/2018 Subchorionic hemorrhage: Small volume encompassing 18 x 13 x 19 millimeters (image 50). Maternal uterus/adnexae: Normal left ovary measuring 3.8 x 1.8 x 2.0 centimeters. Normal right ovary measuring 3.4 x 1.3 x 2.6 centimeters. No pelvic free fluid. IMPRESSION: Single IUP demonstrated with small volume subchorionic hemorrhage. Electronically Signed   By: Genevie Ann M.D.   On: 10/27/2017 19:36    MAU Management/MDM: Pelvic exam and cultures done Will check baseline Ultrasound to rule out ectopic.  This bleeding/pain can represent a normal pregnancy with bleeding but, I have notified pt that she is at an increased risk of miscarriage.   ASSESSMENT 1. Bleeding in early pregnancy   2. threatened miscarriage 3. Enlarging subchorionic hemorrhage.   PLAN Discharge home Rec repeat  Ultrasound in about 7-10 days or sooner prn  Pt stable at time of discharge. Encouraged to return here or to other Urgent Care/ED if she develops worsening of symptoms, increase in pain, fever, or other concerning symptoms.    Hansel Feinstein CNM, MSN Certified Nurse-Midwife 11/14/2017  1:40 AM   Attestation of Attending Supervision of Advanced Practitioner (CNM/NP): Evaluation and management procedures were performed by  the Advanced Practitioner under my supervision and  collaboration. I have also seen and examined the patient. I have explained with her , her husband and her mother the increased risk of SAB.  I have reviewed the Advanced Practitioner's note and chart, and I agree with the management and plan.  Vern Guerette Harraway-Smith 1:58 AM

## 2017-11-14 NOTE — Discharge Instructions (Signed)
Hematoma subcorinico (Subchorionic Hematoma) Un hematoma subcorinico es una acumulacin de sangre entre la pared externa de la placenta y la pared interna del la matriz (tero). La placenta es el rgano que conecta el feto a la pared del tero. La placenta realiza la funcin de alimentacin, respiracin (oxgeno al feto) y el trabajo de eliminacin de desechos (excrecin) del feto. Un hematoma subcorinico es la anormalidad ms frecuente encontrada en una ecografa durante el primer trimestre o principios del segundo trimestre del embarazo. Si ha habido poca o ninguna hemorragia vaginal, generalmente los pequeos hematomas se reducen por su propia cuenta y no afectan al beb ni al [place]. La sangre es absorbida gradualmente durante una o [place]. Cuando la hemorragia comienza ms tarde en el embarazo o el hematoma es ms grande o se produce en una paciente de edad avanzada, el resultado puede no ser tan bueno. Los grandes hematomas pueden agrandarse an ms y [place] las posibilidades de aborto espontneo. El hematoma subcorinico tambin aumenta el riesgo de desprendimiento precoz de la placenta del tero, muerte fetal y Environmental education officer. INSTRUCCIONES PARA EL CUIDADO EN EL HOGAR  Repose en cama si el mdico se lo recomienda. Aunque el reposo en cama no evitar la hemorragia o un aborto espontneo, su mdico puede recomendarlo.  Evite levantar objetos pesados (ms de 10 libras [4,5 kg]), hacer ejercicio, tener relaciones sexuales o realizar duchas vaginales segn se lo indique el profesional.  Lleve un registro de la cantidad y Energy manager de remojo (saturacin) de las toallas higinicas que Medical laboratory scientific officer. Anote esta informacin.  No use tampones.  Cumpla con todas las visitas de control, segn le indique su mdico. El profesional podr pedirle que se realice anlisis de seguimiento, pruebas de [place] o [place].  SOLICITE ATENCIN MDICA DE INMEDIATO SI:  Siente calambres intensos en el  estmago, en la espalda, en el abdomen o en la pelvis.  Tiene fiebre.  Elimina cogulos o tejidos grandes. Guarde los tejidos para que su mdico los vea.  Si la hemorragia aumenta o siente mareos, debilidad o tiene episodios de [place].  Esta informacin no tiene Marine scientist el consejo del mdico. Asegrese de hacerle al mdico cualquier pregunta que tenga. Document Released: 05/16/2008 Document Revised: 11/18/2012 Document Reviewed: 08/27/2012 Elsevier Interactive Patient Education  2017 [place] Hematoma A subchorionic hematoma is a gathering of blood between the outer wall of the placenta and the inner wall of the womb (uterus). The placenta is the organ that connects the fetus to the wall of the uterus. The placenta performs the feeding, breathing (oxygen to the fetus), and waste removal (excretory work) of the fetus. Subchorionic hematoma is the most common abnormality found on a result from ultrasonography done during the first trimester or early second trimester of pregnancy. If there has been little or no vaginal bleeding, early small hematomas usually shrink on their own and do not affect your baby or pregnancy. The blood is gradually absorbed over 1-2 weeks. When bleeding starts later in pregnancy or the hematoma is larger or occurs in an older pregnant woman, the outcome may not be as good. Larger hematomas may get bigger, which increases the chances for miscarriage. Subchorionic hematoma also increases the risk of premature detachment of the placenta from the uterus, preterm (premature) labor, and stillbirth. Follow these instructions at home:  Stay on bed rest if your health care provider recommends this. Although bed rest will not prevent more bleeding or prevent a miscarriage, your health care provider  may recommend bed rest until you are advised otherwise.  Avoid heavy lifting (more than 10 lb [4.5 kg]), exercise, sexual intercourse, or douching as  directed by your health care provider.  Keep track of the number of pads you use each day and how soaked (saturated) they are. Write down this information.  Do not use tampons.  Keep all follow-up appointments as directed by your health care provider. Your health care provider may ask you to have follow-up blood tests or ultrasound tests or both. Get help right away if:  You have severe cramps in your stomach, back, abdomen, or pelvis.  You have a fever.  You pass large clots or tissue. Save any tissue for your health care provider to look at.  Your bleeding increases or you become lightheaded, feel weak, or have fainting episodes. This information is not intended to replace advice given to you by your health care provider. Make sure you discuss any questions you have with your health care provider. Document Released: 05/15/2006 Document Revised: 07/06/2015 Document Reviewed: 08/27/2012 Elsevier Interactive Patient Education  2017 [employer].

## 2017-11-21 ENCOUNTER — Other Ambulatory Visit: Payer: Self-pay | Admitting: Obstetrics and Gynecology

## 2017-11-21 ENCOUNTER — Other Ambulatory Visit (HOSPITAL_COMMUNITY)
Admission: RE | Admit: 2017-11-21 | Discharge: 2017-11-21 | Disposition: A | Discharge status: Home / Self Care | Payer: BLUE CROSS/BLUE SHIELD | Source: Ambulatory Visit | Attending: Obstetrics and Gynecology | Admitting: Obstetrics and Gynecology

## 2017-11-21 DIAGNOSIS — O09291 Supervision of pregnancy with other poor reproductive or obstetric history, first trimester: Secondary | ICD-10-CM

## 2017-11-21 DIAGNOSIS — O468X1 Other antepartum hemorrhage, first trimester: Secondary | ICD-10-CM | POA: Diagnosis not present

## 2017-11-21 DIAGNOSIS — O418X11 Other specified disorders of amniotic fluid and membranes, first trimester, fetus 1: Secondary | ICD-10-CM | POA: Diagnosis not present

## 2017-11-21 DIAGNOSIS — N883 Incompetence of cervix uteri: Secondary | ICD-10-CM | POA: Diagnosis not present

## 2017-11-25 LAB — CYTOLOGY - PAP
CHLAMYDIA, DNA PROBE: NEGATIVE
Diagnosis: NEGATIVE
HPV (WINDOPATH): NOT DETECTED
Neisseria Gonorrhea: NEGATIVE

## 2017-11-29 ENCOUNTER — Inpatient Hospital Stay (HOSPITAL_COMMUNITY)
Admission: AD | Admit: 2017-11-29 | Discharge: 2017-11-29 | Disposition: A | Discharge status: Home / Self Care | Payer: BLUE CROSS/BLUE SHIELD | Attending: Obstetrics and Gynecology | Admitting: Obstetrics and Gynecology

## 2017-11-29 ENCOUNTER — Inpatient Hospital Stay (HOSPITAL_COMMUNITY): Payer: BLUE CROSS/BLUE SHIELD

## 2017-11-29 ENCOUNTER — Encounter (HOSPITAL_COMMUNITY): Payer: Self-pay | Admitting: *Deleted

## 2017-11-29 DIAGNOSIS — Z3A10 10 weeks gestation of pregnancy: Secondary | ICD-10-CM | POA: Diagnosis not present

## 2017-11-29 DIAGNOSIS — O208 Other hemorrhage in early pregnancy: Secondary | ICD-10-CM | POA: Diagnosis not present

## 2017-11-29 DIAGNOSIS — Z3A11 11 weeks gestation of pregnancy: Secondary | ICD-10-CM | POA: Diagnosis not present

## 2017-11-29 DIAGNOSIS — O209 Hemorrhage in early pregnancy, unspecified: Secondary | ICD-10-CM

## 2017-11-29 DIAGNOSIS — O468X1 Other antepartum hemorrhage, first trimester: Secondary | ICD-10-CM

## 2017-11-29 DIAGNOSIS — O200 Threatened abortion: Secondary | ICD-10-CM

## 2017-11-29 DIAGNOSIS — O418X10 Other specified disorders of amniotic fluid and membranes, first trimester, not applicable or unspecified: Secondary | ICD-10-CM

## 2017-11-29 LAB — URINALYSIS, ROUTINE W REFLEX MICROSCOPIC
BILIRUBIN URINE: NEGATIVE
Glucose, UA: NEGATIVE mg/dL
Ketones, ur: NEGATIVE mg/dL
LEUKOCYTES UA: NEGATIVE
NITRITE: NEGATIVE
PROTEIN: NEGATIVE mg/dL
RBC / HPF: >50 RBC/hpf — ABNORMAL HIGH (ref 0–5)
SPECIFIC GRAVITY, URINE: 1.015 (ref 1.005–1.030)
pH: 8 (ref 5.0–8.0)

## 2017-11-29 LAB — HCG, QUANTITATIVE, PREGNANCY: HCG, BETA CHAIN, QUANT, S: 102264 m[IU]/mL — AB (ref <5)

## 2017-11-29 NOTE — MAU Note (Signed)
Went to [facility] about 105mins ago and having bright red bleeding. Had bleeding 2 wks ago and since then bleeding has been brown. THis am is bright red with abd cramping. I have done everything the doctor said with pelvic rest and staying at home

## 2017-11-29 NOTE — Discharge Instructions (Signed)
The subchorionic hematoma is enlarging on ultrasound. Unfortunately, this increases the risk of a miscarriage. I would recommend follow up with your OB doctor in one week. Return for any heavy bleeding, severe cramping, lightheadedness/dizziness, fevers, or abnormal vaginal discharge.    Subchorionic Hematoma A subchorionic hematoma is a gathering of blood between the outer wall of the placenta and the inner wall of the womb (uterus). The placenta is the organ that connects the fetus to the wall of the uterus. The placenta performs the feeding, breathing (oxygen to the fetus), and waste removal (excretory work) of the fetus. Subchorionic hematoma is the most common abnormality found on a result from ultrasonography done during the first trimester or early second trimester of pregnancy. If there has been little or no vaginal bleeding, early small hematomas usually shrink on their own and do not affect your baby or pregnancy. The blood is gradually absorbed over 1-2 weeks. When bleeding starts later in pregnancy or the hematoma is larger or occurs in an older pregnant woman, the outcome may not be as good. Larger hematomas may get bigger, which increases the chances for miscarriage. Subchorionic hematoma also increases the risk of premature detachment of the placenta from the uterus, preterm (premature) labor, and stillbirth. Follow these instructions at home:  Stay on bed rest if your health care provider recommends this. Although bed rest will not prevent more bleeding or prevent a miscarriage, your health care provider may recommend bed rest until you are advised otherwise.  Avoid heavy lifting (more than 10 lb [4.5 kg]), exercise, sexual intercourse, or douching as directed by your health care provider.  Keep track of the number of pads you use each day and how soaked (saturated) they are. Write down this information.  Do not use tampons.  Keep all follow-up appointments as directed by your health  care provider. Your health care provider may ask you to have follow-up blood tests or ultrasound tests or both. Get help right away if:  You have severe cramps in your stomach, back, abdomen, or pelvis.  You have a fever.  You pass large clots or tissue. Save any tissue for your health care provider to look at.  Your bleeding increases or you become lightheaded, feel weak, or have fainting episodes. This information is not intended to replace advice given to you by your health care provider. Make sure you discuss any questions you have with your health care provider. Document Released: 05/15/2006 Document Revised: 07/06/2015 Document Reviewed: 08/27/2012 Elsevier Interactive Patient Education  2017 [employer].

## 2017-11-29 NOTE — MAU Provider Note (Signed)
History     CSN: [id number]  Arrival date and time: 11/29/17 0354   None     Chief Complaint  Patient presents with  . Abdominal Pain  . Vaginal Bleeding   HPI  Monica Phelps is a 32 y.o. [id number] female at [redacted]w[redacted]d who presents to MAU with vaginal bleeding. Reports bright red blood that started about 45 minutes prior to arrival when she got up to go to the bathroom. Was seen in MAU 2 weeks ago for bleeding. Since then she has had dark brown spotting. She endorses some abdominal cramping. Has seen some tissue passing, unsure how to describe it.    Has a history of losing a 19 week fetus due to PPROM and large subchorionic hematoma.  OB History    Gravida  2   Para      Term      Preterm      AB  1   Living  0     SAB  1   TAB      Ectopic      Multiple      Live Births              Past Medical History:  Diagnosis Date  . Anxiety    paniac  . Asthma   . Blood dyscrasia    pt states hemorrhaged with last period 12/14 and was hospitalized in [place]  . Complication of anesthesia   . Constipation   . Endometriosis   . Fibroid   . Gastritis   . GERD (gastroesophageal reflux disease)   . Headache(784.0)    migraines  . History of bleeding disorder as a child   . PONV (postoperative nausea and vomiting)   . Reflux   . UTI (lower urinary tract infection)     Past Surgical History:  Procedure Laterality Date  . DILATION AND CURETTAGE OF UTERUS    . DILATION AND CURETTAGE OF UTERUS N/A 03/03/2013   Procedure: DILATATION AND CURETTAGE;  Surgeon: Emily Filbert, MD;  Location: [facility];  Service: Gynecology;  Laterality: N/A;  . INDUCED ABORTION     at 5 months fetal anomalies hemorrhaged afterwards  . LAPAROSCOPIC LYSIS OF ADHESIONS N/A 03/03/2013   Procedure: LAPAROSCOPIC LYSIS OF ADHESIONS;  Surgeon: Emily Filbert, MD;  Location: [facility];  Service: Gynecology;  Laterality: N/A;  . LAPAROSCOPY N/A 03/03/2013   Procedure: LAPAROSCOPY DIAGNOSTIC;  Surgeon:  Emily Filbert, MD;  Location: [facility];  Service: Gynecology;  Laterality: N/A;  . MASS EXCISION Left 10/27/2013   Procedure: EXCISION  LEFT BREAST MASS;  Surgeon: Stark Klein, MD;  Location: [facility];  Service: General;  Laterality: Left;    Family History  Problem Relation Age of Onset  . Hypertension Mother   . Heart disease Mother   . Stroke Mother   . Hyperlipidemia Father   . Hypothyroidism Other        several fam members   . Alcohol abuse Neg Hx   . Arthritis Neg Hx   . Asthma Neg Hx   . Birth defects Neg Hx   . Cancer Neg Hx   . Depression Neg Hx   . COPD Neg Hx   . Diabetes Neg Hx   . Drug abuse Neg Hx   . Early death Neg Hx   . Hearing loss Neg Hx   . Kidney disease Neg Hx   . Learning disabilities Neg Hx   . Mental illness  Neg Hx   . Mental retardation Neg Hx   . Miscarriages / Stillbirths Neg Hx   . Vision loss Neg Hx     Social History   Tobacco Use  . Smoking status: Never Smoker  . Smokeless tobacco: Never Used  Substance Use Topics  . Alcohol use: No    Alcohol/week: 0.0 standard drinks  . Drug use: No    Allergies:  Allergies  Allergen Reactions  . Dilaudid [Hydromorphone Hcl] Anaphylaxis and Nausea And Vomiting  . Morphine And Related Shortness Of Breath and Nausea And Vomiting  . Latex Itching    Medications Prior to Admission  Medication Sig Dispense Refill Last Dose  . Ascorbic Acid (VITAMIN C PO) Take 1 tablet by mouth daily.   Not Taking  . CALCIUM PO Take 1 tablet by mouth daily.   Taking  . Cholecalciferol (VITAMIN D PO) Take 1 tablet by mouth daily.   Not Taking  . pantoprazole (PROTONIX) 40 MG tablet Take 1 tablet (40 mg total) by mouth daily. 30 tablet 0   . VITAMIN E PO Take 1 tablet by mouth daily.   Taking    Review of Systems  Constitutional: Negative for chills and fever.  Respiratory: Negative for shortness of breath.   Cardiovascular: Negative for chest pain.  Gastrointestinal: Positive for abdominal  pain. Negative for abdominal distention, diarrhea, nausea and vomiting.  Genitourinary: Positive for vaginal bleeding. Negative for difficulty urinating, dysuria, frequency and vaginal discharge.  Musculoskeletal: Negative for back pain.  Skin: Negative for rash.   Physical Exam   Blood pressure 121/69, pulse 74, temperature 98.3 F (36.8 C), resp. rate 18, height 5\' 4"  (1.626 m), weight 65.3 kg, last menstrual period 09/15/2017.  Physical Exam  Constitutional: She is oriented to person, place, and time. She appears well-developed and well-nourished. No distress.  HENT:  Head: Normocephalic and atraumatic.  Eyes: Conjunctivae and EOM are normal.  Neck: Normal range of motion. Neck supple.  Cardiovascular: Normal rate, regular rhythm and normal heart sounds.  Respiratory: Effort normal and breath sounds normal. No respiratory distress.  GI: Soft. She exhibits no distension. There is no tenderness. There is no rebound and no guarding.  Genitourinary:  Genitourinary Comments: Patient refused pelvic exam.   Musculoskeletal: Normal range of motion.  Neurological: She is alert and oriented to person, place, and time.  Skin: Skin is warm and dry. She is not diaphoretic.  Psychiatric: She has a normal mood and affect. Her behavior is normal.    MAU Course  Procedures  MDM bHCG obtained and measured 102,264. Repeat transvaginal ultrasound performed. Patient refused pelvic exam.   Assessment and Plan   1. Threatened abortion in first trimester   2. Vaginal bleeding affecting early pregnancy   3. Subchorionic hematoma in first trimester, single or unspecified fetus   4. Bleeding in early pregnancy    Sono showed live IUP at [redacted]w[redacted]d GA. Subchorionic hemorrhage increased in size since previous imaging 15 days prior with a new cystic component in the chorion, likely reflecting clot retraction within the hematoma. Unable to assess cervical status given that patient has refused pelvic exam.  Discussed with patient that this is a threatened SAB until proven otherwise. Have recommended f/u with OB. May need repeat sono to follow up subchorionic hematoma.    Melina Schools 11/29/2017, 4:26 AM

## 2017-12-02 ENCOUNTER — Other Ambulatory Visit: Payer: Self-pay | Admitting: Obstetrics and Gynecology

## 2017-12-02 DIAGNOSIS — N6310 Unspecified lump in the right breast, unspecified quadrant: Secondary | ICD-10-CM

## 2017-12-03 DIAGNOSIS — Z36 Encounter for antenatal screening for chromosomal anomalies: Secondary | ICD-10-CM | POA: Diagnosis not present

## 2017-12-03 DIAGNOSIS — O468X1 Other antepartum hemorrhage, first trimester: Secondary | ICD-10-CM | POA: Diagnosis not present

## 2017-12-03 DIAGNOSIS — Z3689 Encounter for other specified antenatal screening: Secondary | ICD-10-CM | POA: Diagnosis not present

## 2017-12-03 DIAGNOSIS — Z3A11 11 weeks gestation of pregnancy: Secondary | ICD-10-CM | POA: Diagnosis not present

## 2017-12-03 DIAGNOSIS — O209 Hemorrhage in early pregnancy, unspecified: Secondary | ICD-10-CM | POA: Diagnosis not present

## 2017-12-03 DIAGNOSIS — O09291 Supervision of pregnancy with other poor reproductive or obstetric history, first trimester: Secondary | ICD-10-CM | POA: Diagnosis not present

## 2017-12-03 DIAGNOSIS — Z3682 Encounter for antenatal screening for nuchal translucency: Secondary | ICD-10-CM | POA: Diagnosis not present

## 2017-12-03 DIAGNOSIS — O418X10 Other specified disorders of amniotic fluid and membranes, first trimester, not applicable or unspecified: Secondary | ICD-10-CM | POA: Diagnosis not present

## 2017-12-04 ENCOUNTER — Ambulatory Visit
Admission: RE | Admit: 2017-12-04 | Discharge: 2017-12-04 | Disposition: A | Discharge status: Home / Self Care | Payer: BLUE CROSS/BLUE SHIELD | Source: Ambulatory Visit | Attending: Obstetrics and Gynecology | Admitting: Obstetrics and Gynecology

## 2017-12-04 DIAGNOSIS — N6310 Unspecified lump in the right breast, unspecified quadrant: Secondary | ICD-10-CM

## 2017-12-04 DIAGNOSIS — N6489 Other specified disorders of breast: Secondary | ICD-10-CM | POA: Diagnosis not present

## 2017-12-05 NOTE — Pre-Procedure Instructions (Signed)
[id number] interpreter number

## 2017-12-09 ENCOUNTER — Encounter (HOSPITAL_COMMUNITY): Payer: Self-pay

## 2017-12-09 ENCOUNTER — Ambulatory Visit (HOSPITAL_COMMUNITY)
Admission: RE | Admit: 2017-12-09 | Discharge: 2017-12-09 | Disposition: A | Discharge status: Home / Self Care | Payer: BLUE CROSS/BLUE SHIELD | Source: Ambulatory Visit | Attending: Obstetrics and Gynecology | Admitting: Obstetrics and Gynecology

## 2017-12-10 DIAGNOSIS — O09211 Supervision of pregnancy with history of pre-term labor, first trimester: Secondary | ICD-10-CM | POA: Diagnosis not present

## 2017-12-10 DIAGNOSIS — Z3A12 12 weeks gestation of pregnancy: Secondary | ICD-10-CM | POA: Diagnosis not present

## 2017-12-10 DIAGNOSIS — O219 Vomiting of pregnancy, unspecified: Secondary | ICD-10-CM | POA: Diagnosis not present

## 2017-12-10 DIAGNOSIS — O4691 Antepartum hemorrhage, unspecified, first trimester: Secondary | ICD-10-CM | POA: Diagnosis not present

## 2017-12-16 ENCOUNTER — Encounter (HOSPITAL_COMMUNITY): Admission: RE | Payer: Self-pay | Source: Ambulatory Visit

## 2017-12-16 ENCOUNTER — Ambulatory Visit (HOSPITAL_COMMUNITY)
Admission: RE | Admit: 2017-12-16 | Payer: BLUE CROSS/BLUE SHIELD | Source: Ambulatory Visit | Admitting: Obstetrics and Gynecology

## 2017-12-16 SURGERY — CERCLAGE, CERVIX, VAGINAL APPROACH
Anesthesia: Choice

## 2017-12-21 ENCOUNTER — Encounter (HOSPITAL_COMMUNITY): Payer: Self-pay | Admitting: *Deleted

## 2017-12-21 ENCOUNTER — Inpatient Hospital Stay (HOSPITAL_COMMUNITY)
Admission: EM | Admit: 2017-12-21 | Discharge: 2017-12-21 | Disposition: A | Discharge status: Home / Self Care | Payer: BLUE CROSS/BLUE SHIELD | Source: Ambulatory Visit | Attending: Obstetrics & Gynecology | Admitting: Obstetrics & Gynecology

## 2017-12-21 DIAGNOSIS — O418X10 Other specified disorders of amniotic fluid and membranes, first trimester, not applicable or unspecified: Secondary | ICD-10-CM

## 2017-12-21 DIAGNOSIS — O208 Other hemorrhage in early pregnancy: Secondary | ICD-10-CM | POA: Diagnosis present

## 2017-12-21 DIAGNOSIS — O200 Threatened abortion: Secondary | ICD-10-CM | POA: Insufficient documentation

## 2017-12-21 DIAGNOSIS — Z3A13 13 weeks gestation of pregnancy: Secondary | ICD-10-CM | POA: Diagnosis not present

## 2017-12-21 DIAGNOSIS — O468X1 Other antepartum hemorrhage, first trimester: Secondary | ICD-10-CM

## 2017-12-21 LAB — URINALYSIS, ROUTINE W REFLEX MICROSCOPIC
Bilirubin Urine: NEGATIVE
Glucose, UA: NEGATIVE mg/dL
Ketones, ur: NEGATIVE mg/dL
Nitrite: NEGATIVE
PROTEIN: NEGATIVE mg/dL
SPECIFIC GRAVITY, URINE: 1.008 (ref 1.005–1.030)
pH: 8 (ref 5.0–8.0)

## 2017-12-21 LAB — POCT FERN TEST: POCT Fern Test: NEGATIVE

## 2017-12-21 LAB — WET PREP, GENITAL
Clue Cells Wet Prep HPF POC: NONE SEEN
Sperm: NONE SEEN
Trich, Wet Prep: NONE SEEN
YEAST WET PREP: NONE SEEN

## 2017-12-21 LAB — CBC
HEMATOCRIT: 33.6 % — AB (ref 36.0–46.0)
HEMOGLOBIN: 11.4 g/dL — AB (ref 12.0–15.0)
MCH: 31.8 pg (ref 26.0–34.0)
MCHC: 33.9 g/dL (ref 30.0–36.0)
MCV: 93.9 fL (ref 80.0–100.0)
NRBC: 0 % (ref 0.0–0.2)
Platelets: 253 10*3/uL (ref 150–400)
RBC: 3.58 MIL/uL — ABNORMAL LOW (ref 3.87–5.11)
RDW: 12.9 % (ref 11.5–15.5)
WBC: 9.6 10*3/uL (ref 4.0–10.5)

## 2017-12-21 NOTE — Discharge Instructions (Signed)
Threatened Miscarriage °A threatened miscarriage is when you have vaginal bleeding during your first 20 weeks of pregnancy but the pregnancy has not ended. Your doctor will do tests to make sure you are still pregnant. The cause of the bleeding may not be known. This condition does not mean your pregnancy will end. It does increase the risk of it ending (complete miscarriage). °Follow these instructions at home: °· Make sure you keep all your doctor visits for prenatal care. °· Get plenty of rest. °· Do not have sex or use tampons if you have vaginal bleeding. °· Do not douche. °· Do not smoke or use drugs. °· Do not drink alcohol. °· Avoid caffeine. °Contact a doctor if: °· You have light bleeding from your vagina. °· You have belly pain or cramping. °· You have a fever. °Get help right away if: °· You have heavy bleeding from your vagina. °· You have clots of blood coming from your vagina. °· You have bad pain or cramps in your low back or belly. °· You have fever, chills, and bad belly pain. °This information is not intended to replace advice given to you by your health care provider. Make sure you discuss any questions you have with your health care provider. °Document Released: 01/11/2008 Document Revised: 07/06/2015 Document Reviewed: 11/24/2012 °Elsevier Interactive Patient Education © 2018 Elsevier Inc. ° °

## 2017-12-21 NOTE — MAU Provider Note (Signed)
Chief Complaint: Abdominal Pain; Back Pain; Vaginal Bleeding; and Vaginal Discharge   First Provider Initiated Contact with Patient 12/21/17 1730     SUBJECTIVE HPI: Monica Phelps is a 32 y.o. G3O7564 at [redacted]w[redacted]d with known large subchorionic hemorrhage who presents to Maternity Admissions reporting increased cramping, new vaginal odor and ongoing vaginal bleeding.  Started prenatal care with [facility] but elected to transfer care to see [facility] in [facility] due to history of 18 weight loss. PPPROM's at 16 weeks, cervix was closed. Developed Sx of Chorio at 18 weeks. Was induced. Baby passed away during labor. States she had a consultation with Dr. Hoy Morn regarding whether she was a candidate for cerclage, but was told that her history was not consistent with incompetent cervix therefore cerclage is not indicated. States her next appt is 11/13 at [facility] and [place] planned for 2 weeks from now. Reviewed records under CareEverywhere which confirm this plan.   Location: Suprapubic Quality: cramping Severity: moderate Duration: several weeks Course: Worsening Context: [redacted] weeks gestation, known large subchorionic hemorrhage Timing: Intermittent.  Lasts about 1 minute at a time.  Occurs approximately 10 times per day. Modifying factors: Improves with Tylenol Associated signs and symptoms: Positive for vaginal bleeding, vaginal odor and passing small clots.  Past Medical History:  Diagnosis Date  . Anxiety    paniac  . Asthma   . Blood dyscrasia    pt states hemorrhaged with last period 12/14 and was hospitalized in [place]  . Complication of anesthesia   . Constipation   . Endometriosis   . Fibroid   . Gastritis   . GERD (gastroesophageal reflux disease)   . Headache(784.0)    migraines  . History of bleeding disorder as a child   . PONV (postoperative nausea and vomiting)   . Reflux   . UTI (lower urinary tract infection)    OB History  Gravida Para Term Preterm AB Living  2        1 0  SAB TAB Ectopic Multiple Live Births  1            # Outcome Date GA Lbr Len/2nd Weight Sex Delivery Anes PTL Lv  2 Current           1 SAB  [redacted]w[redacted]d          Past Surgical History:  Procedure Laterality Date  . DILATION AND CURETTAGE OF UTERUS    . DILATION AND CURETTAGE OF UTERUS N/A 03/03/2013   Procedure: DILATATION AND CURETTAGE;  Surgeon: Emily Filbert, MD;  Location: [facility];  Service: Gynecology;  Laterality: N/A;  . INDUCED ABORTION     at 5 months fetal anomalies hemorrhaged afterwards  . LAPAROSCOPIC LYSIS OF ADHESIONS N/A 03/03/2013   Procedure: LAPAROSCOPIC LYSIS OF ADHESIONS;  Surgeon: Emily Filbert, MD;  Location: [facility];  Service: Gynecology;  Laterality: N/A;  . LAPAROSCOPY N/A 03/03/2013   Procedure: LAPAROSCOPY DIAGNOSTIC;  Surgeon: Emily Filbert, MD;  Location: [facility];  Service: Gynecology;  Laterality: N/A;  . MASS EXCISION Left 10/27/2013   Procedure: EXCISION  LEFT BREAST MASS;  Surgeon: Stark Klein, MD;  Location: [place];  Service: General;  Laterality: Left;   Social History   Socioeconomic History  . Marital status: Married    Spouse name: Not on file  . Number of children: 0  . Years of education: Not on file  . Highest education level: Not on file  Occupational History  . Occupation: stay home  Social Needs  . Financial resource strain: Not on file  . Food insecurity:    Worry: Not on file    Inability: Not on file  . Transportation needs:    Medical: Not on file    Non-medical: Not on file  Tobacco Use  . Smoking status: Never Smoker  . Smokeless tobacco: Never Used  Substance and Sexual Activity  . Alcohol use: No    Alcohol/week: 0.0 standard drinks  . Drug use: No  . Sexual activity: Yes    Partners: Male    Birth control/protection:  Pregnant  Lifestyle  . Physical activity:    Days per week: Not on file    Minutes per session: Not on file  . Stress: Not on file  Relationships  . Social connections:    Talks on  phone: Not on file    Gets together: Not on file    Attends religious service: Not on file    Active member of club or organization: Not on file    Attends meetings of clubs or organizations: Not on file    Relationship status: Not on file  . Intimate partner violence:    Fear of current or ex partner: Not on file    Emotionally abused: Not on file    Physically abused: Not on file    Forced sexual activity: Not on file  Other Topics Concern  . Not on file  Social History Narrative   Original from [place]   Family History  Problem Relation Age of Onset  . Hypertension Mother   . Heart disease Mother   . Stroke Mother   . Hyperlipidemia Father   . Hypothyroidism Other        several fam members   . Alcohol abuse Neg Hx   . Arthritis Neg Hx   . Asthma Neg Hx   . Birth defects Neg Hx   . Cancer Neg Hx   . Depression Neg Hx   . COPD Neg Hx   . Diabetes Neg Hx   . Drug abuse Neg Hx   . Early death Neg Hx   . Hearing loss Neg Hx   . Kidney disease Neg Hx   . Learning disabilities Neg Hx   . Mental illness Neg Hx   . Mental retardation Neg Hx   . Miscarriages / Stillbirths Neg Hx   . Vision loss Neg Hx    No current facility-administered medications on file prior to encounter.    Current Outpatient Medications on File Prior to Encounter  Medication Sig Dispense Refill  . Ascorbic Acid (VITAMIN C PO) Take 1 tablet by mouth daily.    Marland Kitchen CALCIUM PO Take 1 tablet by mouth daily.    . Cholecalciferol (VITAMIN D PO) Take 1 tablet by mouth daily.    . pantoprazole (PROTONIX) 40 MG tablet Take 1 tablet (40 mg total) by mouth daily. 30 tablet 0  . VITAMIN E PO Take 1 tablet by mouth daily.     Allergies  Allergen Reactions  . Dilaudid [Hydromorphone Hcl] Anaphylaxis and Nausea And Vomiting  . Morphine And Related Shortness Of Breath and Nausea And Vomiting  . Latex Itching    I have reviewed patient's Past Medical Hx, Surgical Hx, Family Hx, Social Hx, medications and  allergies.   Review of Systems  Constitutional: Negative for chills and fever.  Gastrointestinal: Positive for abdominal pain.  Genitourinary: Positive for pelvic pain and vaginal bleeding. Negative for difficulty urinating, dysuria,  flank pain, frequency, hematuria, vaginal discharge and vaginal pain.  Neurological: Negative for dizziness.    OBJECTIVE Patient Vitals for the past 24 hrs:  BP Temp Temp src Pulse Resp Weight  12/21/17 1730 106/61 - - 64 - -  12/21/17 1402 112/62 98.4 F (36.9 C) Oral 76 18 66.2 kg   Constitutional: Well-developed, well-nourished female in no acute distress.  Anxious Cardiovascular: normal rate Respiratory: normal rate and effort.  GI: Abd soft, non-tender, gravid appropriate for gestational age. MS: Extremities nontender, no edema, normal ROM Neurologic: Alert and oriented x 4.  GU:  SPECULUM EXAM: NEFG, physiologic discharge, small of brown blood noted, cervix clean, visually closed.  No mucopurulent discharge or pooling of fluid.  BIMANUAL: cervix closed/thick; uterus 14-week size, no adnexal tenderness or masses.  Positive CMT versus intolerance of exam.  Fetal heart rate 153 by Doppler.  LAB RESULTS Results for orders placed or performed during the hospital encounter of 12/21/17 (from the past 24 hour(s))  Urinalysis, Routine w reflex microscopic     Status: Abnormal   Collection Time: 12/21/17  2:29 PM  Result Value Ref Range   Color, Urine STRAW (A) YELLOW   APPearance CLEAR CLEAR   Specific Gravity, Urine 1.008 1.005 - 1.030   pH 8.0 5.0 - 8.0   Glucose, UA NEGATIVE NEGATIVE mg/dL   Hgb urine dipstick MODERATE (A) NEGATIVE   Bilirubin Urine NEGATIVE NEGATIVE   Ketones, ur NEGATIVE NEGATIVE mg/dL   Protein, ur NEGATIVE NEGATIVE mg/dL   Nitrite NEGATIVE NEGATIVE   Leukocytes, UA TRACE (A) NEGATIVE   RBC / HPF 0-5 0 - 5 RBC/hpf   WBC, UA 0-5 0 - 5 WBC/hpf   Bacteria, UA RARE (A) NONE SEEN   Squamous Epithelial / LPF 0-5 0 - 5  CBC      Status: Abnormal   Collection Time: 12/21/17  3:06 PM  Result Value Ref Range   WBC 9.6 4.0 - 10.5 K/uL   RBC 3.58 (L) 3.87 - 5.11 MIL/uL   Hemoglobin 11.4 (L) 12.0 - 15.0 g/dL   HCT 33.6 (L) 36.0 - 46.0 %   MCV 93.9 80.0 - 100.0 fL   MCH 31.8 26.0 - 34.0 pg   MCHC 33.9 30.0 - 36.0 g/dL   RDW 12.9 11.5 - 15.5 %   Platelets 253 150 - 400 K/uL   nRBC 0.0 0.0 - 0.2 %  Fern Test     Status: None   Collection Time: 12/21/17  3:18 PM  Result Value Ref Range   POCT Fern Test Negative = intact amniotic membranes   Wet prep, genital     Status: Abnormal   Collection Time: 12/21/17  3:37 PM  Result Value Ref Range   Yeast Wet Prep HPF POC NONE SEEN NONE SEEN   Trich, Wet Prep NONE SEEN NONE SEEN   Clue Cells Wet Prep HPF POC NONE SEEN NONE SEEN   WBC, Wet Prep HPF POC FEW (A) NONE SEEN   Sperm NONE SEEN     IMAGING N/A  MAU COURSE Orders Placed This Encounter  Procedures  . Wet prep, genital  . Culture, OB Urine  . Urinalysis, Routine w reflex microscopic  . CBC  . Fern Test  . Discharge patient   No orders of the defined types were placed in this encounter.   MDM -Dark brown vaginal bleeding, cramping and passage of small clots due to known large subchorionic hemorrhage. Uncertain why cramping has increased, but cervix is still long  and closed not C/W impending SAB. No evidence of infection w/ Nml WBC, Neg urine Micro (will culture urine to be sure), Neg CG/Chlamydia cultures at last visit and neg wet prep today. Discussed Hx, labs, exam w/ Dr. Ihor Dow. Agrees w/ POC. New orders: None.   Pt and spouse with many questions about prognosis, what intervention or Tx are available. CNM explained that there is not anything to Tx right now. Offered reassurance that FHR is present, cervix closed and no sign of infection, but explained that increased bleeding and cramping in early pregnancy raise concern for miscarriage.    ASSESSMENT 1. Threatened miscarriage   2. Subchorionic  hemorrhage of placenta in first trimester, single or unspecified fetus     PLAN Discharge home in stable condition. Bleeding, SAB precautions Continue Tylenol PRN for pain.  Follow-up Information    your obstetrician Follow up on 12/24/2017.   Why:  as scheduled or sooner as needed if symptoms worsen       [facility] Follow up.   Why:  as needed in pregnancy emergencies Contact information: [address] [phone] [place] [phone]         Allergies as of 12/21/2017      Reactions   Dilaudid [hydromorphone Hcl] Anaphylaxis, Nausea And Vomiting   Morphine And Related Shortness Of Breath, Nausea And Vomiting   Latex Itching      Medication List    TAKE these medications   CALCIUM PO Take 1 tablet by mouth daily.   pantoprazole 40 MG tablet Commonly known as:  PROTONIX Take 1 tablet (40 mg total) by mouth daily.   VITAMIN C PO Take 1 tablet by mouth daily.   VITAMIN D PO Take 1 tablet by mouth daily.   VITAMIN E PO Take 1 tablet by mouth daily.        Tamala Julian, [place], [place] 12/21/2017  6:59 PM

## 2017-12-21 NOTE — MAU Note (Signed)
Has been bleeding throughout whole pregnancy, has been seeing it in her underwear and when she wipes  The last 3-4 days has started to have lower abdominal and back pain  States she has seen a few blood clots and that the bleeding is now starting to have an odor and is brown/yellow

## 2017-12-22 LAB — CULTURE, OB URINE
CULTURE: NO GROWTH
Special Requests: NORMAL

## 2017-12-24 DIAGNOSIS — O09292 Supervision of pregnancy with other poor reproductive or obstetric history, second trimester: Secondary | ICD-10-CM | POA: Diagnosis not present

## 2017-12-24 DIAGNOSIS — O418X10 Other specified disorders of amniotic fluid and membranes, first trimester, not applicable or unspecified: Secondary | ICD-10-CM | POA: Diagnosis not present

## 2017-12-24 DIAGNOSIS — Z3A12 12 weeks gestation of pregnancy: Secondary | ICD-10-CM | POA: Diagnosis not present

## 2017-12-24 DIAGNOSIS — N803 Endometriosis of pelvic peritoneum: Secondary | ICD-10-CM | POA: Diagnosis not present

## 2017-12-24 DIAGNOSIS — O209 Hemorrhage in early pregnancy, unspecified: Secondary | ICD-10-CM | POA: Diagnosis not present

## 2017-12-24 DIAGNOSIS — O218 Other vomiting complicating pregnancy: Secondary | ICD-10-CM | POA: Diagnosis not present

## 2017-12-24 DIAGNOSIS — O9989 Other specified diseases and conditions complicating pregnancy, childbirth and the puerperium: Secondary | ICD-10-CM | POA: Diagnosis not present

## 2017-12-24 DIAGNOSIS — Z3A14 14 weeks gestation of pregnancy: Secondary | ICD-10-CM | POA: Diagnosis not present

## 2017-12-24 DIAGNOSIS — O4692 Antepartum hemorrhage, unspecified, second trimester: Secondary | ICD-10-CM | POA: Diagnosis not present

## 2017-12-24 DIAGNOSIS — O208 Other hemorrhage in early pregnancy: Secondary | ICD-10-CM | POA: Diagnosis not present

## 2017-12-24 DIAGNOSIS — Z8759 Personal history of other complications of pregnancy, childbirth and the puerperium: Secondary | ICD-10-CM | POA: Diagnosis not present

## 2017-12-24 DIAGNOSIS — R102 Pelvic and perineal pain: Secondary | ICD-10-CM | POA: Diagnosis not present

## 2018-01-07 DIAGNOSIS — Z3A16 16 weeks gestation of pregnancy: Secondary | ICD-10-CM | POA: Diagnosis not present

## 2018-01-07 DIAGNOSIS — O09292 Supervision of pregnancy with other poor reproductive or obstetric history, second trimester: Secondary | ICD-10-CM | POA: Diagnosis not present

## 2018-01-07 DIAGNOSIS — Z3686 Encounter for antenatal screening for cervical length: Secondary | ICD-10-CM | POA: Diagnosis not present

## 2018-01-07 DIAGNOSIS — O9989 Other specified diseases and conditions complicating pregnancy, childbirth and the puerperium: Secondary | ICD-10-CM | POA: Diagnosis not present

## 2018-01-07 DIAGNOSIS — O09892 Supervision of other high risk pregnancies, second trimester: Secondary | ICD-10-CM | POA: Diagnosis not present

## 2018-01-07 DIAGNOSIS — R112 Nausea with vomiting, unspecified: Secondary | ICD-10-CM | POA: Diagnosis not present

## 2018-01-07 DIAGNOSIS — O208 Other hemorrhage in early pregnancy: Secondary | ICD-10-CM | POA: Diagnosis not present

## 2018-01-07 DIAGNOSIS — O209 Hemorrhage in early pregnancy, unspecified: Secondary | ICD-10-CM | POA: Diagnosis not present

## 2018-01-21 DIAGNOSIS — O09292 Supervision of pregnancy with other poor reproductive or obstetric history, second trimester: Secondary | ICD-10-CM | POA: Diagnosis not present

## 2018-01-21 DIAGNOSIS — O26872 Cervical shortening, second trimester: Secondary | ICD-10-CM | POA: Diagnosis not present

## 2018-01-21 DIAGNOSIS — O9989 Other specified diseases and conditions complicating pregnancy, childbirth and the puerperium: Secondary | ICD-10-CM | POA: Diagnosis not present

## 2018-01-21 DIAGNOSIS — O219 Vomiting of pregnancy, unspecified: Secondary | ICD-10-CM | POA: Diagnosis not present

## 2018-01-21 DIAGNOSIS — O0992 Supervision of high risk pregnancy, unspecified, second trimester: Secondary | ICD-10-CM | POA: Diagnosis not present

## 2018-01-21 DIAGNOSIS — O209 Hemorrhage in early pregnancy, unspecified: Secondary | ICD-10-CM | POA: Diagnosis not present

## 2018-01-21 DIAGNOSIS — R300 Dysuria: Secondary | ICD-10-CM | POA: Diagnosis not present

## 2018-01-21 DIAGNOSIS — Z3A18 18 weeks gestation of pregnancy: Secondary | ICD-10-CM | POA: Diagnosis not present

## 2018-01-21 DIAGNOSIS — O4692 Antepartum hemorrhage, unspecified, second trimester: Secondary | ICD-10-CM | POA: Diagnosis not present

## 2018-01-22 DIAGNOSIS — R300 Dysuria: Secondary | ICD-10-CM | POA: Diagnosis not present

## 2018-01-26 DIAGNOSIS — Z885 Allergy status to narcotic agent status: Secondary | ICD-10-CM | POA: Diagnosis not present

## 2018-01-26 DIAGNOSIS — Z3A19 19 weeks gestation of pregnancy: Secondary | ICD-10-CM | POA: Diagnosis not present

## 2018-01-26 DIAGNOSIS — O09291 Supervision of pregnancy with other poor reproductive or obstetric history, first trimester: Secondary | ICD-10-CM | POA: Diagnosis not present

## 2018-01-26 DIAGNOSIS — F419 Anxiety disorder, unspecified: Secondary | ICD-10-CM | POA: Diagnosis not present

## 2018-01-26 DIAGNOSIS — O208 Other hemorrhage in early pregnancy: Secondary | ICD-10-CM | POA: Diagnosis not present

## 2018-01-26 DIAGNOSIS — Z91040 Latex allergy status: Secondary | ICD-10-CM | POA: Diagnosis not present

## 2018-01-26 DIAGNOSIS — J45909 Unspecified asthma, uncomplicated: Secondary | ICD-10-CM | POA: Diagnosis not present

## 2018-01-26 DIAGNOSIS — O4692 Antepartum hemorrhage, unspecified, second trimester: Secondary | ICD-10-CM | POA: Diagnosis not present

## 2018-01-26 DIAGNOSIS — G4700 Insomnia, unspecified: Secondary | ICD-10-CM | POA: Diagnosis not present

## 2018-01-26 DIAGNOSIS — Z79899 Other long term (current) drug therapy: Secondary | ICD-10-CM | POA: Diagnosis not present

## 2018-01-26 DIAGNOSIS — O418X20 Other specified disorders of amniotic fluid and membranes, second trimester, not applicable or unspecified: Secondary | ICD-10-CM | POA: Diagnosis not present

## 2018-01-26 DIAGNOSIS — O9952 Diseases of the respiratory system complicating childbirth: Secondary | ICD-10-CM | POA: Diagnosis not present

## 2018-01-26 DIAGNOSIS — O26872 Cervical shortening, second trimester: Secondary | ICD-10-CM | POA: Diagnosis not present

## 2018-01-26 DIAGNOSIS — O219 Vomiting of pregnancy, unspecified: Secondary | ICD-10-CM | POA: Diagnosis not present

## 2018-01-26 DIAGNOSIS — O99344 Other mental disorders complicating childbirth: Secondary | ICD-10-CM | POA: Diagnosis not present

## 2018-01-26 DIAGNOSIS — Z888 Allergy status to other drugs, medicaments and biological substances status: Secondary | ICD-10-CM | POA: Diagnosis not present

## 2018-01-26 DIAGNOSIS — O09292 Supervision of pregnancy with other poor reproductive or obstetric history, second trimester: Secondary | ICD-10-CM | POA: Diagnosis not present

## 2018-01-26 DIAGNOSIS — Z8249 Family history of ischemic heart disease and other diseases of the circulatory system: Secondary | ICD-10-CM | POA: Diagnosis not present

## 2018-02-05 DIAGNOSIS — Z79899 Other long term (current) drug therapy: Secondary | ICD-10-CM | POA: Diagnosis not present

## 2018-02-05 DIAGNOSIS — O26852 Spotting complicating pregnancy, second trimester: Secondary | ICD-10-CM | POA: Diagnosis not present

## 2018-02-05 DIAGNOSIS — O468X9 Other antepartum hemorrhage, unspecified trimester: Secondary | ICD-10-CM | POA: Diagnosis not present

## 2018-02-05 DIAGNOSIS — Z515 Encounter for palliative care: Secondary | ICD-10-CM | POA: Diagnosis not present

## 2018-02-05 DIAGNOSIS — J45909 Unspecified asthma, uncomplicated: Secondary | ICD-10-CM | POA: Diagnosis not present

## 2018-02-05 DIAGNOSIS — O4592 Premature separation of placenta, unspecified, second trimester: Secondary | ICD-10-CM | POA: Diagnosis not present

## 2018-02-05 DIAGNOSIS — O210 Mild hyperemesis gravidarum: Secondary | ICD-10-CM | POA: Diagnosis not present

## 2018-02-05 DIAGNOSIS — Z3A21 21 weeks gestation of pregnancy: Secondary | ICD-10-CM | POA: Diagnosis not present

## 2018-02-05 DIAGNOSIS — Z91040 Latex allergy status: Secondary | ICD-10-CM | POA: Diagnosis not present

## 2018-02-05 DIAGNOSIS — Z3A20 20 weeks gestation of pregnancy: Secondary | ICD-10-CM | POA: Diagnosis not present

## 2018-02-05 DIAGNOSIS — O26892 Other specified pregnancy related conditions, second trimester: Secondary | ICD-10-CM | POA: Diagnosis not present

## 2018-02-05 DIAGNOSIS — O99344 Other mental disorders complicating childbirth: Secondary | ICD-10-CM | POA: Diagnosis not present

## 2018-02-05 DIAGNOSIS — O208 Other hemorrhage in early pregnancy: Secondary | ICD-10-CM | POA: Diagnosis not present

## 2018-02-05 DIAGNOSIS — O321XX0 Maternal care for breech presentation, not applicable or unspecified: Secondary | ICD-10-CM | POA: Diagnosis not present

## 2018-02-05 DIAGNOSIS — O3412 Maternal care for benign tumor of corpus uteri, second trimester: Secondary | ICD-10-CM | POA: Diagnosis not present

## 2018-02-05 DIAGNOSIS — O09292 Supervision of pregnancy with other poor reproductive or obstetric history, second trimester: Secondary | ICD-10-CM | POA: Diagnosis not present

## 2018-02-05 DIAGNOSIS — F329 Major depressive disorder, single episode, unspecified: Secondary | ICD-10-CM | POA: Diagnosis not present

## 2018-02-05 DIAGNOSIS — O99814 Abnormal glucose complicating childbirth: Secondary | ICD-10-CM | POA: Diagnosis not present

## 2018-02-05 DIAGNOSIS — O9952 Diseases of the respiratory system complicating childbirth: Secondary | ICD-10-CM | POA: Diagnosis not present

## 2018-02-05 DIAGNOSIS — O4692 Antepartum hemorrhage, unspecified, second trimester: Secondary | ICD-10-CM | POA: Diagnosis not present

## 2018-02-05 DIAGNOSIS — Z885 Allergy status to narcotic agent status: Secondary | ICD-10-CM | POA: Diagnosis not present

## 2018-02-05 DIAGNOSIS — O42912 Preterm premature rupture of membranes, unspecified as to length of time between rupture and onset of labor, second trimester: Secondary | ICD-10-CM | POA: Diagnosis not present

## 2018-02-05 DIAGNOSIS — O418X90 Other specified disorders of amniotic fluid and membranes, unspecified trimester, not applicable or unspecified: Secondary | ICD-10-CM | POA: Diagnosis not present

## 2018-02-05 DIAGNOSIS — Z888 Allergy status to other drugs, medicaments and biological substances status: Secondary | ICD-10-CM | POA: Diagnosis not present

## 2018-02-05 DIAGNOSIS — O26872 Cervical shortening, second trimester: Secondary | ICD-10-CM | POA: Diagnosis not present

## 2018-03-26 DIAGNOSIS — Z1151 Encounter for screening for human papillomavirus (HPV): Secondary | ICD-10-CM | POA: Diagnosis not present

## 2018-03-30 ENCOUNTER — Inpatient Hospital Stay (HOSPITAL_COMMUNITY)
Admission: AD | Admit: 2018-03-30 | Discharge: 2018-03-30 | Disposition: A | Discharge status: Home / Self Care | Payer: BLUE CROSS/BLUE SHIELD | Attending: Obstetrics and Gynecology | Admitting: Obstetrics and Gynecology

## 2018-03-30 ENCOUNTER — Encounter (HOSPITAL_COMMUNITY): Payer: Self-pay

## 2018-03-30 DIAGNOSIS — N920 Excessive and frequent menstruation with regular cycle: Secondary | ICD-10-CM | POA: Diagnosis not present

## 2018-03-30 LAB — CBC
HEMATOCRIT: 40.1 % (ref 36.0–46.0)
HEMOGLOBIN: 13.4 g/dL (ref 12.0–15.0)
MCH: 31.5 pg (ref 26.0–34.0)
MCHC: 33.4 g/dL (ref 30.0–36.0)
MCV: 94.1 fL (ref 80.0–100.0)
NRBC: 0 % (ref 0.0–0.2)
Platelets: 278 10*3/uL (ref 150–400)
RBC: 4.26 MIL/uL (ref 3.87–5.11)
RDW: 12.2 % (ref 11.5–15.5)
WBC: 5.2 10*3/uL (ref 4.0–10.5)

## 2018-03-30 MED ORDER — M.V.I. ADULT IV INJ
Freq: Once | INTRAVENOUS | Status: AC
  Administered 2018-03-30: 15:00:00 via INTRAVENOUS
  Filled 2018-03-30: qty 1000

## 2018-03-30 NOTE — MAU Note (Signed)
Pt is a G2P0110 s/p SVD at 21 weeks on 02/15/18.  Patient now states she is having diarrhea 5 times in past 24 hours and increased bleeding, having to change pad every hour or less.  Patient states she has history of endometriosis but bleeding is much heavier than her normal heavy flow.  Pt also states she is having abdominal and hip pain.

## 2018-03-30 NOTE — Discharge Instructions (Signed)
On 04/05/2018, the [facility] will be moving to the [place] campus. At that time, the MAU (Maternity Admissions Unit), where you are being seen today, will no longer take care of non-pregnant patients. We strongly encourage you to find a doctor's office before that time, so that you can be seen with any GYN concerns, like vaginal discharge, urinary tract infection, etc.. in a timely manner. ° °In order to make an office visit more convenient, the [facility] will be offering evening hours with same-day appointments, walk-in appointments and scheduled appointments available during this time. ° °[facility] Hours: °Monday - 8am - 7:30 pm with walk-in between 4pm- 7:30 pm °Tuesday - 8 am - 5 pm  open late and accepting walk-ins from 4pm - 7:30pm °Wednesday - 8 am - 5 pm open late and accepting walk-ins from 4pm - 7:30pm °Thursday 8 am - 5 pm open late and accepting walk-ins from 4pm - 7:30pm °Friday 8 am - 5 pm ° °For an appointment please call the [facility] at [phone] ° °For urgent needs, [facility] is also available for management of urgent GYN complaints such as vaginal discharge or urinary tract infections. ° ° ° ° ° °

## 2018-03-30 NOTE — MAU Provider Note (Signed)
History     CSN: [id number]  Arrival date and time: 03/30/18 1221   First Provider Initiated Contact with Patient 03/30/18 1324      Chief Complaint  Patient presents with  . Vaginal Bleeding  . Diarrhea   HPI  Ms.  Monica Phelps is a 33 y.o. year old G66P0110 PP female who presents to MAU reporting she delivered vaginally at 21.5 wks on 02/15/2018 at [facility]. She reports she has had cramping since her delivery. She has a h/o endometriosis and normally has a heavy flow. She states that she thinks she has started her first period (on 03/29/2018) since the delivery, but it "is much heavier than I'm used to." She reports passing clots with the heavy bleeding. She saw Dr. Hoy Morn for her PP F/U and he informed her that she would have a heavy period right after delivery. She reports she "feels really weak, wants to sleep all of the time and need IVFs." She also states that Dr. Hoy Morn also told her she has a septated uterus and will need to be referred to another doctor. She is awaiting a call from the other doctor's office for that appointment. She reports she has had 5 episodes of diarrhea in 24 hrs. She also reports that when she "gets really nervous she gets an upset stomach and diarrhea."  Past Medical History:  Diagnosis Date  . Anxiety    paniac  . Asthma   . Blood dyscrasia    pt states hemorrhaged with last period 12/14 and was hospitalized in [place]  . Complication of anesthesia   . Constipation   . Endometriosis   . Fibroid   . Gastritis   . GERD (gastroesophageal reflux disease)   . Headache(784.0)    migraines  . History of bleeding disorder as a child   . PONV (postoperative nausea and vomiting)   . Reflux   . UTI (lower urinary tract infection)     Past Surgical History:  Procedure Laterality Date  . DILATION AND CURETTAGE OF UTERUS    . DILATION AND CURETTAGE OF UTERUS N/A 03/03/2013   Procedure: DILATATION AND CURETTAGE;  Surgeon: Emily Filbert, MD;   Location: [facility];  Service: Gynecology;  Laterality: N/A;  . INDUCED ABORTION     at 5 months fetal anomalies hemorrhaged afterwards  . LAPAROSCOPIC LYSIS OF ADHESIONS N/A 03/03/2013   Procedure: LAPAROSCOPIC LYSIS OF ADHESIONS;  Surgeon: Emily Filbert, MD;  Location: [facility];  Service: Gynecology;  Laterality: N/A;  . LAPAROSCOPY N/A 03/03/2013   Procedure: LAPAROSCOPY DIAGNOSTIC;  Surgeon: Emily Filbert, MD;  Location: [facility];  Service: Gynecology;  Laterality: N/A;  . MASS EXCISION Left 10/27/2013   Procedure: EXCISION  LEFT BREAST MASS;  Surgeon: Stark Klein, MD;  Location: [facility];  Service: General;  Laterality: Left;    Family History  Problem Relation Age of Onset  . Hypertension Mother   . Heart disease Mother   . Stroke Mother   . Hyperlipidemia Father   . Hypothyroidism Other        several fam members   . Alcohol abuse Neg Hx   . Arthritis Neg Hx   . Asthma Neg Hx   . Birth defects Neg Hx   . Cancer Neg Hx   . Depression Neg Hx   . COPD Neg Hx   . Diabetes Neg Hx   . Drug abuse Neg Hx   . Early death Neg Hx   .  Hearing loss Neg Hx   . Kidney disease Neg Hx   . Learning disabilities Neg Hx   . Mental illness Neg Hx   . Mental retardation Neg Hx   . Miscarriages / Stillbirths Neg Hx   . Vision loss Neg Hx     Social History   Tobacco Use  . Smoking status: Never Smoker  . Smokeless tobacco: Never Used  Substance Use Topics  . Alcohol use: No    Alcohol/week: 0.0 standard drinks  . Drug use: No    Allergies:  Allergies  Allergen Reactions  . Dilaudid [Hydromorphone Hcl] Anaphylaxis and Nausea And Vomiting  . Morphine And Related Shortness Of Breath and Nausea And Vomiting  . Latex Itching    Medications Prior to Admission  Medication Sig Dispense Refill Last Dose  . Ascorbic Acid (VITAMIN C PO) Take 1 tablet by mouth daily.   Not Taking  . CALCIUM PO Take 1 tablet by mouth daily.   Taking  . Cholecalciferol (VITAMIN D PO) Take 1 tablet  by mouth daily.   Not Taking  . pantoprazole (PROTONIX) 40 MG tablet Take 1 tablet (40 mg total) by mouth daily. 30 tablet 0   . VITAMIN E PO Take 1 tablet by mouth daily.   Taking    Review of Systems  Constitutional: Positive for fatigue.  HENT: Negative.   Eyes: Negative.   Respiratory: Negative.   Cardiovascular: Negative.   Gastrointestinal: Negative.   Endocrine: Negative.   Genitourinary: Positive for vaginal bleeding (heavier with clots).  Musculoskeletal: Negative.   Skin: Negative.   Allergic/Immunologic: Negative.   Neurological: Positive for weakness.  Hematological: Negative.   Psychiatric/Behavioral: Negative.    Physical Exam   Blood pressure 122/65, pulse 66, temperature 98.3 F (36.8 C), temperature source Oral, resp. rate 18.  Physical Exam  Nursing note and vitals reviewed. Constitutional: She is oriented to person, place, and time. She appears well-developed and well-nourished.  HENT:  Head: Normocephalic and atraumatic.  Eyes: Pupils are equal, round, and reactive to light.  Neck: Normal range of motion.  Cardiovascular: Normal rate and regular rhythm.  Respiratory: Effort normal.  GI: Soft.  Genitourinary:    Genitourinary Comments: Uterus: non-tender, SE: cervix is smooth, pink, no lesions, small amt of bright, red blood in vaginal  Vault; Bimanual exam: closed/long/firm, no CMT or friability, mild bilateral adnexal tenderness (L>R) -- pt states she has had that tenderness before   Musculoskeletal: Normal range of motion.  Neurological: She is alert and oriented to person, place, and time.  Skin: Skin is warm and dry.  Psychiatric: She has a normal mood and affect. Her behavior is normal. Judgment and thought content normal.    MAU Course  Procedures  MDM CCUA CBC MVI in D5LR @ bolus rate -- "feels much better"  Results for orders placed or performed during the hospital encounter of 03/30/18 (from the past 24 hour(s))  CBC     Status: None    Collection Time: 03/30/18  1:59 PM  Result Value Ref Range   WBC 5.2 4.0 - 10.5 K/uL   RBC 4.26 3.87 - 5.11 MIL/uL   Hemoglobin 13.4 12.0 - 15.0 g/dL   HCT 40.1 36.0 - 46.0 %   MCV 94.1 80.0 - 100.0 fL   MCH 31.5 26.0 - 34.0 pg   MCHC 33.4 30.0 - 36.0 g/dL   RDW 12.2 11.5 - 15.5 %   Platelets 278 150 - 400 K/uL   nRBC 0.0 0.0 -  0.2 %   Assessment and Plan  Menorrhagia with regular cycle - Plan: Discharge patient - Advised to F/U with Dr. Hoy Morn  - Information provided on menorrhagia & AUB - Patient verbalized an understanding of the plan of care and agrees.     Laury Deep, MSN, CNM 03/30/2018, 1:24 PM

## 2018-03-30 NOTE — MAU Note (Signed)
Urine sent to lab 

## 2018-04-08 DIAGNOSIS — R102 Pelvic and perineal pain: Secondary | ICD-10-CM | POA: Diagnosis not present

## 2018-04-08 DIAGNOSIS — D259 Leiomyoma of uterus, unspecified: Secondary | ICD-10-CM | POA: Diagnosis not present

## 2018-04-08 DIAGNOSIS — N921 Excessive and frequent menstruation with irregular cycle: Secondary | ICD-10-CM | POA: Diagnosis not present

## 2018-04-14 DIAGNOSIS — Z30011 Encounter for initial prescription of contraceptive pills: Secondary | ICD-10-CM | POA: Diagnosis not present

## 2018-04-14 DIAGNOSIS — N809 Endometriosis, unspecified: Secondary | ICD-10-CM | POA: Diagnosis not present

## 2018-04-14 DIAGNOSIS — N946 Dysmenorrhea, unspecified: Secondary | ICD-10-CM | POA: Diagnosis not present

## 2018-07-17 ENCOUNTER — Encounter: Payer: Self-pay | Admitting: Family Medicine

## 2018-07-17 ENCOUNTER — Other Ambulatory Visit: Payer: Self-pay | Admitting: Family Medicine

## 2018-07-17 DIAGNOSIS — R1011 Right upper quadrant pain: Secondary | ICD-10-CM

## 2018-07-17 DIAGNOSIS — R1013 Epigastric pain: Secondary | ICD-10-CM

## 2018-07-27 ENCOUNTER — Encounter (HOSPITAL_BASED_OUTPATIENT_CLINIC_OR_DEPARTMENT_OTHER): Payer: Self-pay | Admitting: *Deleted

## 2018-07-27 ENCOUNTER — Emergency Department (HOSPITAL_BASED_OUTPATIENT_CLINIC_OR_DEPARTMENT_OTHER)
Admission: EM | Admit: 2018-07-27 | Discharge: 2018-07-27 | Disposition: A | Discharge status: Home / Self Care | Payer: BC Managed Care – PPO | Attending: Emergency Medicine | Admitting: Emergency Medicine

## 2018-07-27 ENCOUNTER — Other Ambulatory Visit: Payer: Self-pay

## 2018-07-27 DIAGNOSIS — Z91040 Latex allergy status: Secondary | ICD-10-CM | POA: Insufficient documentation

## 2018-07-27 DIAGNOSIS — L239 Allergic contact dermatitis, unspecified cause: Secondary | ICD-10-CM | POA: Diagnosis not present

## 2018-07-27 DIAGNOSIS — R21 Rash and other nonspecific skin eruption: Secondary | ICD-10-CM | POA: Diagnosis present

## 2018-07-27 DIAGNOSIS — Z79899 Other long term (current) drug therapy: Secondary | ICD-10-CM | POA: Insufficient documentation

## 2018-07-27 DIAGNOSIS — L232 Allergic contact dermatitis due to cosmetics: Secondary | ICD-10-CM | POA: Diagnosis not present

## 2018-07-27 MED ORDER — PREDNISONE 20 MG PO TABS: ORAL_TABLET | ORAL | 0 refills | Status: DC

## 2018-07-27 MED ORDER — DIPHENHYDRAMINE HCL 25 MG PO CAPS
50.0000 mg | ORAL_CAPSULE | Freq: Once | ORAL | Status: AC
  Administered 2018-07-27: 50 mg via ORAL
  Filled 2018-07-27: qty 2

## 2018-07-27 MED ORDER — PREDNISONE 50 MG PO TABS
60.0000 mg | ORAL_TABLET | Freq: Once | ORAL | Status: AC
  Administered 2018-07-27: 60 mg via ORAL
  Filled 2018-07-27: qty 1

## 2018-07-27 NOTE — ED Triage Notes (Signed)
Rash to bilateral armpits and groin x 5 hours.

## 2018-07-27 NOTE — ED Provider Notes (Signed)
[facility] EMERGENCY DEPARTMENT Provider Note   CSN: [id number] Arrival date & time: 07/27/18  2314     History   Chief Complaint Chief Complaint  Patient presents with  . Rash    HPI Monica Phelps is a 33 y.o. female.     The history is provided by the patient. A language interpreter was used.  Rash Location:  Pelvis (B axilla) Pelvic rash location:  Groin Quality: itchiness   Severity:  Mild Onset quality:  Gradual Timing:  Constant Progression:  Unchanged Chronicity:  New Context: new detergent/soap   Context comment:  New deoderant Relieved by:  Nothing Worsened by:  Nothing Ineffective treatments:  None tried Associated symptoms: no fever, no hoarse voice, no induration, no shortness of breath, no throat swelling, no tongue swelling and not wheezing   USed husband's detergent and new deodorant.  Now itching.  Also has ingrown hair in right axilla and wants to know if that is the problem.    Past Medical History:  Diagnosis Date  . Anxiety    paniac  . Asthma   . Blood dyscrasia    pt states hemorrhaged with last period 12/14 and was hospitalized in [place]  . Complication of anesthesia   . Constipation   . Endometriosis   . Fibroid   . Gastritis   . GERD (gastroesophageal reflux disease)   . Headache(784.0)    migraines  . History of bleeding disorder as a child   . PONV (postoperative nausea and vomiting)   . Reflux   . UTI (lower urinary tract infection)     Patient Active Problem List   Diagnosis Date Noted  . Heavy menses 03/30/2018  . History of endometriosis 01/29/2016  . DUB (dysfunctional uterine bleeding) 07/06/2015  . Breast pain, left 07/06/2015  . Slow transit constipation 09/27/2014  . Family history of diabetes mellitus 03/03/2014  . Tinea 03/03/2014  . Palpitations 10/22/2013  . Family history of premature CAD 10/22/2013  . Chronic pelvic pain in female 06/01/2013  . Fibrocystic breast changes of both  breasts 04/20/2013  . Endometriosis of pelvis 03/02/2013  . Ectropion of cervix 05/14/2012    Past Surgical History:  Procedure Laterality Date  . DILATION AND CURETTAGE OF UTERUS    . DILATION AND CURETTAGE OF UTERUS N/A 03/03/2013   Procedure: DILATATION AND CURETTAGE;  Surgeon: Emily Filbert, MD;  Location: [facility];  Service: Gynecology;  Laterality: N/A;  . INDUCED ABORTION     at 5 months fetal anomalies hemorrhaged afterwards  . LAPAROSCOPIC LYSIS OF ADHESIONS N/A 03/03/2013   Procedure: LAPAROSCOPIC LYSIS OF ADHESIONS;  Surgeon: Emily Filbert, MD;  Location: [facility];  Service: Gynecology;  Laterality: N/A;  . LAPAROSCOPY N/A 03/03/2013   Procedure: LAPAROSCOPY DIAGNOSTIC;  Surgeon: Emily Filbert, MD;  Location: [facility];  Service: Gynecology;  Laterality: N/A;  . MASS EXCISION Left 10/27/2013   Procedure: EXCISION  LEFT BREAST MASS;  Surgeon: Stark Klein, MD;  Location: [facility];  Service: General;  Laterality: Left;     OB History    Gravida  2   Para  1   Term      Preterm  1   AB  1   Living  0     SAB  1   TAB      Ectopic      Multiple      Live Births  Home Medications    Prior to Admission medications   Medication Sig Start Date End Date Taking? Authorizing Provider  Ascorbic Acid (VITAMIN C PO) Take 1 tablet by mouth daily.    [provider]  CALCIUM PO Take 1 tablet by mouth daily.    [provider]  Cholecalciferol (VITAMIN D PO) Take 1 tablet by mouth daily.    [provider]  pantoprazole (PROTONIX) 40 MG tablet Take 1 tablet (40 mg total) by mouth daily. 01/08/17   Molpus, John, MD  predniSONE (DELTASONE) 20 MG tablet 3 tabs po day one, then 2 po daily x 4 days 07/27/18   Colby Reels, MD  VITAMIN E PO Take 1 tablet by mouth daily.    [provider]    Family History Family History  Problem Relation Age of Onset  . Hypertension Mother   . Heart disease Mother   . Stroke  Mother   . Hyperlipidemia Father   . Hypothyroidism Other        several fam members   . Alcohol abuse Neg Hx   . Arthritis Neg Hx   . Asthma Neg Hx   . Birth defects Neg Hx   . Cancer Neg Hx   . Depression Neg Hx   . COPD Neg Hx   . Diabetes Neg Hx   . Drug abuse Neg Hx   . Early death Neg Hx   . Hearing loss Neg Hx   . Kidney disease Neg Hx   . Learning disabilities Neg Hx   . Mental illness Neg Hx   . Mental retardation Neg Hx   . Miscarriages / Stillbirths Neg Hx   . Vision loss Neg Hx     Social History Social History   Tobacco Use  . Smoking status: Never Smoker  . Smokeless tobacco: Never Used  Substance Use Topics  . Alcohol use: No    Alcohol/week: 0.0 standard drinks  . Drug use: No     Allergies   Dilaudid [hydromorphone hcl], Morphine and related, and Latex   Review of Systems Review of Systems  Constitutional: Negative for fever.  HENT: Negative for drooling, facial swelling and hoarse voice.   Respiratory: Negative for shortness of breath and wheezing.   Skin: Positive for rash.  All other systems reviewed and are negative.    Physical Exam Updated Vital Signs BP 138/80 (BP Location: Right Arm)   Pulse 73   Temp 98.6 F (37 C) (Oral)   Resp 18   Ht 5\' 3"  (1.6 m)   Wt 66.7 kg   LMP 07/05/2018   SpO2 100%   BMI 26.04 kg/m   Physical Exam Vitals signs and nursing note reviewed.  Constitutional:      General: She is not in acute distress.    Appearance: She is normal weight.  HENT:     Head: Normocephalic and atraumatic.     Nose: Nose normal.     Mouth/Throat:     Mouth: Mucous membranes are moist.     Comments: No swelling of the lips tongue or uvula Eyes:     Conjunctiva/sclera: Conjunctivae normal.     Pupils: Pupils are equal, round, and reactive to light.  Neck:     Musculoskeletal: Normal range of motion and neck supple.  Cardiovascular:     Rate and Rhythm: Normal rate and regular rhythm.     Pulses: Normal pulses.      Heart sounds: Normal heart sounds.  Pulmonary:  Effort: Pulmonary effort is normal.     Breath sounds: Normal breath sounds. No stridor. No wheezing.  Abdominal:     General: Abdomen is flat. Bowel sounds are normal.     Tenderness: There is no abdominal tenderness.  Musculoskeletal: Normal range of motion.  Lymphadenopathy:     Upper Body:     Right upper body: No axillary adenopathy.  Skin:    General: Skin is warm and dry.     Capillary Refill: Capillary refill takes less than 2 seconds.     Comments: Ingrown hair in the right central axilla, no abscess  Neurological:     General: No focal deficit present.     Mental Status: She is alert and oriented to person, place, and time.  Psychiatric:        Mood and Affect: Mood normal.        Behavior: Behavior normal.      ED Treatments / Results  Labs (all labs ordered are listed, but only abnormal results are displayed) Labs Reviewed - No data to display  EKG    Radiology No results found.  Procedures Procedures (including critical care time)  Medications Ordered in ED Medications  predniSONE (DELTASONE) tablet 60 mg (has no administration in time range)  diphenhydrAMINE (BENADRYL) capsule 50 mg (has no administration in time range)      Final Clinical Impressions(s) / ED Diagnoses   Final diagnoses:  Allergic contact dermatitis, unspecified trigger  Return for intractable cough, coughing up blood,fevers >100.4 unrelieved by medication, shortness of breath, intractable vomiting, chest pain, shortness of breath, weakness,numbness, changes in speech, facial asymmetry,abdominal pain, passing out,Inability to tolerate liquids or food, cough, altered mental status or any concerns. No signs of systemic illness or infection. The patient is nontoxic-appearing on exam and vital signs are within normal limits.   I have reviewed the triage vital signs and the nursing notes. Pertinent labs &imaging results that  were available during my care of the patient were reviewed by me and considered in my medical decision making (see chart for details).  After history, exam, and medical workup I feel the patient has been appropriately medically screened and is safe for discharge home. Pertinent diagnoses were discussed with the patient. Patient was given return precautions     ED Discharge Orders         Ordered    predniSONE (DELTASONE) 20 MG tablet  Status:  Discontinued     07/27/18 2349    predniSONE (DELTASONE) 20 MG tablet     07/27/18 2350           Jolyn Deshmukh, MD 07/27/18 2357

## 2018-07-28 ENCOUNTER — Ambulatory Visit
Admission: RE | Admit: 2018-07-28 | Discharge: 2018-07-28 | Disposition: A | Discharge status: Home / Self Care | Payer: BC Managed Care – PPO | Source: Ambulatory Visit | Attending: Family Medicine | Admitting: Family Medicine

## 2018-07-28 DIAGNOSIS — R1011 Right upper quadrant pain: Secondary | ICD-10-CM | POA: Diagnosis not present

## 2018-07-28 DIAGNOSIS — R1013 Epigastric pain: Secondary | ICD-10-CM | POA: Diagnosis not present

## 2018-07-29 ENCOUNTER — Other Ambulatory Visit (HOSPITAL_COMMUNITY): Payer: Self-pay | Admitting: Family Medicine

## 2018-07-29 ENCOUNTER — Other Ambulatory Visit: Payer: Self-pay | Admitting: Family Medicine

## 2018-07-29 DIAGNOSIS — R1011 Right upper quadrant pain: Secondary | ICD-10-CM

## 2018-07-31 ENCOUNTER — Other Ambulatory Visit: Payer: Self-pay

## 2018-07-31 ENCOUNTER — Encounter: Payer: Self-pay | Admitting: Gastroenterology

## 2018-07-31 ENCOUNTER — Telehealth (INDEPENDENT_AMBULATORY_CARE_PROVIDER_SITE_OTHER): Payer: BC Managed Care – PPO | Admitting: Gastroenterology

## 2018-07-31 VITALS — Ht 64.0 in | Wt 147.0 lb

## 2018-07-31 DIAGNOSIS — R110 Nausea: Secondary | ICD-10-CM | POA: Diagnosis not present

## 2018-07-31 DIAGNOSIS — R1011 Right upper quadrant pain: Secondary | ICD-10-CM

## 2018-07-31 MED ORDER — PANTOPRAZOLE SODIUM 40 MG PO TBEC: 40.0000 mg | DELAYED_RELEASE_TABLET | Freq: Two times a day (BID) | ORAL | 1 refills | Status: DC

## 2018-07-31 NOTE — Patient Instructions (Addendum)
If you are age 33 or older, your body mass index should be between 23-30. Your Body mass index is 25.23 kg/m. If this is out of the aforementioned range listed, please consider follow up with your Primary Care Provider.  If you are age 24 or younger, your body mass index should be between 19-25. Your Body mass index is 25.23 kg/m. If this is out of the aformentioned range listed, please consider follow up with your Primary Care Provider.   To help prevent the possible spread of infection to our patients, communities, and staff; we will be implementing the following measures:  As of now we are not allowing any visitors/family members to accompany you to any upcoming appointments with [facility]. If you have any concerns about this please contact our office to discuss prior to the appointment.   You have been scheduled for an endoscopy. Please follow written instructions given to you at your visit today. If you use inhalers (even only as needed), please bring them with you on the day of your procedure. Your physician has requested that you go to www.startemmi.com and enter the access code given to you at your visit today. This web site gives a general overview about your procedure. However, you should still follow specific instructions given to you by our office regarding your preparation for the procedure.  We have sent the following medications to your pharmacy for you to pick up at your convenience: Protonix 40mg  twice daily.  It was a pleasure to see you today!  Vito Cirigliano, D.O.

## 2018-07-31 NOTE — Progress Notes (Signed)
Chief Complaint: RUQ pain  Referring Provider:     Daiva Eves, MD  HPI:    Due to current restrictions/limitations of in-office visits due to the COVID-19 pandemic, this scheduled clinical appointment was converted to a telehealth virtual consultation using [employer].  -The patient did consent to this virtual visit and is aware of possible charges through their insurance for this visit.  -Names of all parties present: Monica Phelps (patient), Gerrit Heck, DO, [facility] (physician) -Patient location: Home -Physician location: Office  Monica Phelps is a 33 y.o. female referred to the Gastroenterology Clinic for evaluation of RUQ pain, radiationg around right back. Started in April. Sxs last 1-2 hours, but more recently lasted 4 hours with nausea without emesis. Pain tends to be post prandial within 25 mins. Can occur with any foods, but worse with cheese, milk, greasy foods. Minimal pain with bland diet. No Rx trial. No melena, hematocvhezia or change in bowel habits. No acholic stools. Similar sxs approx 7 years ago, and reported a negative w/u at that time, to inlcude RUQ [place] and EGD.   Had similar sxs during recent pregnancy (unfortunately, had miscarriage in 02/2018). Sxs worsening since pregnancy.   Was seen by her PCM, Dr. Lisbeth Ply, for this issue in 07/17/2018.  Evaluation largely unrevealing, to include normal CBC, CMP, lipase and normal RUQ ultrasound on 6/16.  Ordered HIDA study, which is scheduled for next week (6/25), and referred to GI for further evaluation.  I reviewed these labs/rads results with her today.  Did recently go to ER with rash and pruritis. Treated with benadryl and topical steroid. No prior similar sxs.  FHx n/f mother and sister with GB dyskineasia (no GB stones), both requiring cholecystectomy.  Otherwise, no known family history of GI malignancy, IBD.  Past medical history, past surgical history, social history, family  history, medications, and allergies reviewed in the chart and with patient.    Past Medical History:  Diagnosis Date  . Anxiety    paniac  . Asthma   . Blood dyscrasia    pt states hemorrhaged with last period 12/14 and was hospitalized in [place]  . Complication of anesthesia   . Constipation   . Endometriosis   . Fibroid   . Gastritis   . GERD (gastroesophageal reflux disease)   . Headache(784.0)    migraines  . History of bleeding disorder as a child   . PONV (postoperative nausea and vomiting)   . Reflux   . UTI (lower urinary tract infection)      Past Surgical History:  Procedure Laterality Date  . DILATION AND CURETTAGE OF UTERUS    . DILATION AND CURETTAGE OF UTERUS N/A 03/03/2013   Procedure: DILATATION AND CURETTAGE;  Surgeon: Emily Filbert, MD;  Location: [facility];  Service: Gynecology;  Laterality: N/A;  . INDUCED ABORTION     at 5 months fetal anomalies hemorrhaged afterwards  . LAPAROSCOPIC LYSIS OF ADHESIONS N/A 03/03/2013   Procedure: LAPAROSCOPIC LYSIS OF ADHESIONS;  Surgeon: Emily Filbert, MD;  Location: [facility];  Service: Gynecology;  Laterality: N/A;  . LAPAROSCOPY N/A 03/03/2013   Procedure: LAPAROSCOPY DIAGNOSTIC;  Surgeon: Emily Filbert, MD;  Location: [facility];  Service: Gynecology;  Laterality: N/A;  . MASS EXCISION Left 10/27/2013   Procedure: EXCISION  LEFT BREAST MASS;  Surgeon: Stark Klein, MD;  Location: [facility];  Service: General;  Laterality: Left;  Family History  Problem Relation Age of Onset  . Hypertension Mother   . Heart disease Mother   . Stroke Mother   . Hyperlipidemia Father   . Hypothyroidism Other        several fam members   . Alcohol abuse Neg Hx   . Arthritis Neg Hx   . Asthma Neg Hx   . Birth defects Neg Hx   . Cancer Neg Hx   . Depression Neg Hx   . COPD Neg Hx   . Diabetes Neg Hx   . Drug abuse Neg Hx   . Early death Neg Hx   . Hearing loss Neg Hx   . Kidney disease Neg Hx   . Learning disabilities Neg Hx    . Mental illness Neg Hx   . Mental retardation Neg Hx   . Miscarriages / Stillbirths Neg Hx   . Vision loss Neg Hx   . Colon cancer Neg Hx    Social History   Tobacco Use  . Smoking status: Never Smoker  . Smokeless tobacco: Never Used  Substance Use Topics  . Alcohol use: No    Alcohol/week: 0.0 standard drinks  . Drug use: No   Current Outpatient Medications  Medication Sig Dispense Refill  . Cholecalciferol (VITAMIN D PO) Take 1 tablet by mouth daily.     No current facility-administered medications for this visit.    Allergies  Allergen Reactions  . Dilaudid [Hydromorphone Hcl] Anaphylaxis and Nausea And Vomiting  . Morphine And Related Shortness Of Breath and Nausea And Vomiting  . Latex Itching     Review of Systems: All systems reviewed and negative except where noted in HPI.     Physical Exam:    Complete physical exam not completed due to the nature of this telehealth communication.   Gen: Awake, alert, and oriented, and well communicative. HEENT: EOMI, non-icteric sclera, NCAT, MMM Neck: Normal movement of head and neck Pulm: No labored breathing, speaking in full sentences without conversational dyspnea Derm: No apparent lesions or bruising in visible field MS: Moves all visible extremities without noticeable abnormality Psych: Pleasant, cooperative, normal speech, thought processing seemingly intact   ASSESSMENT AND PLAN;   1) RUQ pain 2) Nausea  Clinical presentation seems most consistent with biliary etiology, although RUQ [place] was normal as well as CMP and CBC.  Have high suspicion for biliary dyskinesia, which interestingly so too did her mother and sister, both requiring ccy.  However, given ongoing symptoms, associated nausea, possible pending ccy, would also plan for expedited work-up to rule out concomitant mucosal/luminal etiology, H. pylori, etc. as below:  - Schedule for EGD with gastric biopsies - Trial course of Protonix 40 mg p.o. twice  daily to aid in the mucosal inflammation - Surgery referral pending HIDA and EGD results - Resume bland diet for now as tolerated - RTC in 1 month  The indications, risks, and benefits of EGD were explained to the patient in detail. Risks include but are not limited to bleeding, perforation, adverse reaction to medications, and cardiopulmonary compromise. Sequelae include but are not limited to the possibility of surgery, hositalization, and mortality. The patient verbalized understanding and wished to proceed. All questions answered, referred to scheduler. Further recommendations pending results of the exam.     [place], DO, FACG  07/31/2018, 3:38 PM   No ref. provider found

## 2018-08-04 ENCOUNTER — Telehealth: Payer: Self-pay | Admitting: Gastroenterology

## 2018-08-04 NOTE — Telephone Encounter (Signed)
Pt answered NO to all the Covid questions

## 2018-08-04 NOTE — Telephone Encounter (Signed)
Pt had questions about NPO status and drinking liquids the day of the procedure. Pts procedure is at 2:00 pm she is to be NPO after midnight and nothing to drink at 11:00. SM

## 2018-08-04 NOTE — Telephone Encounter (Signed)
Spoke w/patient.  She is on a conference call and will call back regarding the Covid-19 Screening Questions:  Do you now or have you had a fever in the last 14 days?  Do you have any respiratory symptoms of shortness of breath or cough now or in the last 14 days?   Do you have any family members or close contacts with diagnosed or suspected Covid-19 in the past 14 days?  Have you been tested for Covid-19 and found to be positive?

## 2018-08-05 ENCOUNTER — Telehealth: Payer: Self-pay | Admitting: Gastroenterology

## 2018-08-05 ENCOUNTER — Ambulatory Visit (AMBULATORY_SURGERY_CENTER): Payer: BC Managed Care – PPO | Admitting: Gastroenterology

## 2018-08-05 ENCOUNTER — Other Ambulatory Visit: Payer: Self-pay

## 2018-08-05 ENCOUNTER — Encounter: Payer: Self-pay | Admitting: Gastroenterology

## 2018-08-05 VITALS — BP 112/55 | HR 65 | Temp 98.6°F | Resp 15 | Ht 64.0 in | Wt 147.0 lb

## 2018-08-05 DIAGNOSIS — R112 Nausea with vomiting, unspecified: Secondary | ICD-10-CM

## 2018-08-05 DIAGNOSIS — K2950 Unspecified chronic gastritis without bleeding: Secondary | ICD-10-CM | POA: Diagnosis not present

## 2018-08-05 DIAGNOSIS — K2970 Gastritis, unspecified, without bleeding: Secondary | ICD-10-CM | POA: Diagnosis not present

## 2018-08-05 DIAGNOSIS — R1011 Right upper quadrant pain: Secondary | ICD-10-CM

## 2018-08-05 DIAGNOSIS — B9681 Helicobacter pylori [H. pylori] as the cause of diseases classified elsewhere: Secondary | ICD-10-CM | POA: Diagnosis not present

## 2018-08-05 MED ORDER — PANTOPRAZOLE SODIUM 40 MG PO TBEC: 40.0000 mg | DELAYED_RELEASE_TABLET | Freq: Two times a day (BID) | ORAL | 1 refills | Status: DC

## 2018-08-05 MED ORDER — PANTOPRAZOLE SODIUM 40 MG PO TBEC: 40.0000 mg | DELAYED_RELEASE_TABLET | Freq: Every day | ORAL | 3 refills | Status: DC

## 2018-08-05 MED ORDER — SUCRALFATE 1 GM/10ML PO SUSP: 1.0000 g | Freq: Two times a day (BID) | ORAL | 1 refills | Status: DC

## 2018-08-05 MED ORDER — SODIUM CHLORIDE 0.9 % IV SOLN: 500.0000 mL | Freq: Once | INTRAVENOUS | Status: DC

## 2018-08-05 NOTE — Telephone Encounter (Signed)
[facility] [phone] Bing Matter called said tha she has a question regarding the medication CARAFATE

## 2018-08-05 NOTE — Progress Notes (Signed)
Called to room to assist during endoscopic procedure.  Patient ID and intended procedure confirmed with present staff. Received instructions for my participation in the procedure from the performing physician.  

## 2018-08-05 NOTE — Progress Notes (Signed)
Temp taken by CW VS taken by JB 

## 2018-08-05 NOTE — Patient Instructions (Signed)
Thank you for allowing [place] to participate in your care today!  Await pathology results by mail, approximately 2 weeks.  Proceed with HIDA scan as scheduled tomorrow.  Follow up with Dr Bryan Lemma at GI clinic.  Appointment to be scheduled.  Start Protonix and Carafate.  Prescriptions sent to your pharmacy.  Resume diet and medications today.  Return to your normal activities tomorrow.       YOU HAD AN ENDOSCOPIC PROCEDURE TODAY AT [facility]:   Refer to the procedure report that was given to you for any specific questions about what was found during the examination.  If the procedure report does not answer your questions, please call your gastroenterologist to clarify.  If you requested that your care partner not be given the details of your procedure findings, then the procedure report has been included in a sealed envelope for you to review at your convenience later.  YOU SHOULD EXPECT: Some feelings of bloating in the abdomen. Passage of more gas than usual.  Walking can help get rid of the air that was put into your GI tract during the procedure and reduce the bloating. If you had a lower endoscopy (such as a colonoscopy or flexible sigmoidoscopy) you may notice spotting of blood in your stool or on the toilet paper. If you underwent a bowel prep for your procedure, you may not have a normal bowel movement for a few days.  Please Note:  You might notice some irritation and congestion in your nose or some drainage.  This is from the oxygen used during your procedure.  There is no need for concern and it should clear up in a day or so.  SYMPTOMS TO REPORT IMMEDIATELY:     Following upper endoscopy (EGD)  Vomiting of blood or coffee ground material  New chest pain or pain under the shoulder blades  Painful or persistently difficult swallowing  New shortness of breath  Fever of 100F or higher  Black, tarry-looking stools  For urgent or emergent issues, a  gastroenterologist can be reached at any hour by calling [phone].   DIET:  We do recommend a small meal at first, but then you may proceed to your regular diet.  Drink plenty of fluids but you should avoid alcoholic beverages for 24 hours.  ACTIVITY:  You should plan to take it easy for the rest of today and you should NOT DRIVE or use heavy machinery until tomorrow (because of the sedation medicines used during the test).    FOLLOW UP: Our staff will call the number listed on your records 48-72 hours following your procedure to check on you and address any questions or concerns that you may have regarding the information given to you following your procedure. If we do not reach you, we will leave a message.  We will attempt to reach you two times.  During this call, we will ask if you have developed any symptoms of COVID 19. If you develop any symptoms (ie: fever, flu-like symptoms, shortness of breath, cough etc.) before then, please call [phone].  If you test positive for Covid 19 in the 2 weeks post procedure, please call and report this information to [place].    If any biopsies were taken you will be contacted by phone or by letter within the next 1-3 weeks.  Please call us at [phone] if you have not heard about the biopsies in 3 weeks.    SIGNATURES/CONFIDENTIALITY: You and/or your care partner  have signed paperwork which will be entered into your electronic medical record.  These signatures attest to the fact that that the information above on your After Visit Summary has been reviewed and is understood.  Full responsibility of the confidentiality of this discharge information lies with you and/or your care-partner.

## 2018-08-05 NOTE — Progress Notes (Signed)
PT taken to PACU. Monitors in place. VSS. Report given to RN. 

## 2018-08-05 NOTE — Op Note (Signed)
[facility] Patient Name: Monica Phelps Procedure Date: 08/05/2018 1:51 PM MRN: [id number] Endoscopist: Gerrit Heck , MD Age: 33 Referring MD:  Date of Birth: Jun 04, 1985 Gender: Female Account #: [id number] Procedure:                Upper GI endoscopy Indications:              Abdominal pain in the right upper quadrant, Post                            prandial abdominal bloating, Nausea with vomiting Medicines:                Monitored Anesthesia Care Procedure:                Pre-Anesthesia Assessment:                           - Prior to the procedure, a History and Physical                            was performed, and patient medications and                            allergies were reviewed. The patient's tolerance of                            previous anesthesia was also reviewed. The risks                            and benefits of the procedure and the sedation                            options and risks were discussed with the patient.                            All questions were answered, and informed consent                            was obtained. Prior Anticoagulants: The patient has                            taken no previous anticoagulant or antiplatelet                            agents. ASA Grade Assessment: II - A patient with                            mild systemic disease. After reviewing the risks                            and benefits, the patient was deemed in                            satisfactory condition to undergo the procedure.  After obtaining informed consent, the endoscope was                            passed under direct vision. Throughout the                            procedure, the patient's blood pressure, pulse, and                            oxygen saturations were monitored continuously. The                            Endoscope was introduced through the mouth, and   advanced to the second part of duodenum. The upper                            GI endoscopy was accomplished without difficulty.                            The patient tolerated the procedure well. Scope In: Scope Out: Findings:                 The examined esophagus was normal.                           The Z-line was regular and was found 37 cm from the                            incisors.                           Diffuse moderate inflammation characterized by                            congestion (edema) and erythema was found in the                            gastric fundus and in the gastric body. Biopsies                            were taken with a cold forceps for Helicobacter                            pylori testing. Estimated blood loss was minimal.                           The incisura, gastric antrum and pylorus were                            normal. Biopsies were taken with a cold forceps for                            Helicobacter pylori testing. Estimated blood loss  was minimal.                           The duodenal bulb, first portion of the duodenum                            and second portion of the duodenum were normal.                            Biopsies for histology were taken with a cold                            forceps for evaluation of celiac disease. Estimated                            blood loss was minimal. Complications:            No immediate complications. Estimated Blood Loss:     Estimated blood loss was minimal. Impression:               - Normal esophagus.                           - Z-line regular, 37 cm from the incisors.                           - Gastritis. Biopsied.                           - Normal incisura, antrum and pylorus. Biopsied.                           - Normal duodenal bulb, first portion of the                            duodenum and second portion of the duodenum.                             Biopsied. Recommendation:           - Patient has a contact number available for                            emergencies. The signs and symptoms of potential                            delayed complications were discussed with the                            patient. Return to normal activities tomorrow.                            Written discharge instructions were provided to the                            patient.                           -  Resume previous diet.                           - Continue present medications.                           - Await pathology results.                           - Resume Protonix (pantoprazole) 40 mg PO BID for 6                            more weeks, then will titrate to lowest effective                            dose.                           - Use sucralfate tablets 1 gram PO BID for 4 weeks.                           - Proceed with HIDA scan as scheduled tomorrow.                           - Follow-up with Dr. Bryan Lemma in the GI Clinic at                            an appointment to be scheduled. Gerrit Heck, MD 08/05/2018 2:24:40 PM

## 2018-08-06 ENCOUNTER — Ambulatory Visit (HOSPITAL_COMMUNITY)
Admission: RE | Admit: 2018-08-06 | Discharge: 2018-08-06 | Disposition: A | Discharge status: Home / Self Care | Payer: BC Managed Care – PPO | Source: Ambulatory Visit | Attending: Family Medicine | Admitting: Family Medicine

## 2018-08-06 DIAGNOSIS — R1011 Right upper quadrant pain: Secondary | ICD-10-CM

## 2018-08-06 MED ORDER — TECHNETIUM TC 99M MEBROFENIN IV KIT
5.2000 | PACK | Freq: Once | INTRAVENOUS | Status: AC
  Administered 2018-08-06: 5.2 via INTRAVENOUS

## 2018-08-07 ENCOUNTER — Telehealth: Payer: Self-pay

## 2018-08-07 ENCOUNTER — Telehealth: Payer: Self-pay | Admitting: *Deleted

## 2018-08-07 NOTE — Telephone Encounter (Signed)
Patient called back and said that she is feeling fine

## 2018-08-07 NOTE — Telephone Encounter (Signed)
  Follow up Call-  Call back number 08/05/2018  Post procedure Call Back phone  # [phone]  Permission to leave phone message Yes  Some recent data might be hidden     Patient questions:  Do you have a fever, pain , or abdominal swelling? No. Pain Score  0 *  Have you tolerated food without any problems? Yes.    Have you been able to return to your normal activities? Yes.    Do you have any questions about your discharge instructions: Diet   No. Medications  No. Follow up visit  No.  Do you have questions or concerns about your Care? No.  Actions: * If pain score is 4 or above: No action needed, pain <4.  1. Have you developed a fever since your procedure? no  2.   Have you had an respiratory symptoms (SOB or cough) since your procedure? no  3.   Have you tested positive for COVID 19 since your procedure no  4.   Have you had any family members/close contacts diagnosed with the COVID 19 since your procedure?  no   If yes to any of these questions please route to Joylene John, RN and Alphonsa Gin, Therapist, sports.

## 2018-08-07 NOTE — Telephone Encounter (Signed)
Attempted to reach pt. With follow-up call following endoscopic procedure 08/05/2018.  LM on pt. Voice mail.  Will try to reach pt. Again later today.

## 2018-08-12 HISTORY — PX: ROBOTIC ASSISTED LAP VAGINAL HYSTERECTOMY: SHX2362

## 2018-08-12 NOTE — Telephone Encounter (Signed)
Patient states her pain was coming from her Gallbladder and has not picked up her Carafate.

## 2018-08-18 ENCOUNTER — Other Ambulatory Visit: Payer: Self-pay | Admitting: Surgery

## 2018-08-18 DIAGNOSIS — K828 Other specified diseases of gallbladder: Secondary | ICD-10-CM | POA: Diagnosis not present

## 2018-08-21 ENCOUNTER — Other Ambulatory Visit: Payer: Self-pay

## 2018-08-21 DIAGNOSIS — A048 Other specified bacterial intestinal infections: Secondary | ICD-10-CM

## 2018-08-21 MED ORDER — DOXYCYCLINE HYCLATE 50 MG PO CAPS: 100.0000 mg | ORAL_CAPSULE | Freq: Two times a day (BID) | ORAL | 0 refills | Status: AC

## 2018-08-21 MED ORDER — METRONIDAZOLE 250 MG PO TABS: 250.0000 mg | ORAL_TABLET | Freq: Four times a day (QID) | ORAL | 0 refills | Status: AC

## 2018-08-21 MED ORDER — OMEPRAZOLE 20 MG PO CPDR: 20.0000 mg | DELAYED_RELEASE_CAPSULE | Freq: Two times a day (BID) | ORAL | 0 refills | Status: DC

## 2018-08-21 MED ORDER — PEPTO-BISMOL 262 MG PO TABS: 2.0000 | ORAL_TABLET | Freq: Four times a day (QID) | ORAL | 0 refills | Status: AC

## 2018-08-21 NOTE — Addendum Note (Signed)
Addended by: Mohammed Kindle on: 08/21/2018 09:35 AM   Modules accepted: Orders

## 2018-08-31 DIAGNOSIS — Z8249 Family history of ischemic heart disease and other diseases of the circulatory system: Secondary | ICD-10-CM | POA: Diagnosis not present

## 2018-08-31 DIAGNOSIS — N803 Endometriosis of pelvic peritoneum: Secondary | ICD-10-CM | POA: Diagnosis not present

## 2018-08-31 DIAGNOSIS — N809 Endometriosis, unspecified: Secondary | ICD-10-CM | POA: Diagnosis not present

## 2018-08-31 DIAGNOSIS — Z833 Family history of diabetes mellitus: Secondary | ICD-10-CM | POA: Diagnosis not present

## 2018-08-31 DIAGNOSIS — Z01818 Encounter for other preprocedural examination: Secondary | ICD-10-CM | POA: Diagnosis not present

## 2018-09-01 ENCOUNTER — Telehealth: Payer: BC Managed Care – PPO | Admitting: Gastroenterology

## 2018-09-05 DIAGNOSIS — Z1159 Encounter for screening for other viral diseases: Secondary | ICD-10-CM | POA: Diagnosis not present

## 2018-09-07 DIAGNOSIS — N946 Dysmenorrhea, unspecified: Secondary | ICD-10-CM | POA: Diagnosis not present

## 2018-09-07 DIAGNOSIS — D252 Subserosal leiomyoma of uterus: Secondary | ICD-10-CM | POA: Diagnosis not present

## 2018-09-07 DIAGNOSIS — N838 Other noninflammatory disorders of ovary, fallopian tube and broad ligament: Secondary | ICD-10-CM | POA: Diagnosis not present

## 2018-09-07 DIAGNOSIS — N939 Abnormal uterine and vaginal bleeding, unspecified: Secondary | ICD-10-CM | POA: Diagnosis not present

## 2018-09-07 DIAGNOSIS — N72 Inflammatory disease of cervix uteri: Secondary | ICD-10-CM | POA: Diagnosis not present

## 2018-09-07 DIAGNOSIS — N803 Endometriosis of pelvic peritoneum: Secondary | ICD-10-CM | POA: Diagnosis not present

## 2018-09-17 DIAGNOSIS — Z09 Encounter for follow-up examination after completed treatment for conditions other than malignant neoplasm: Secondary | ICD-10-CM | POA: Diagnosis not present

## 2018-09-17 DIAGNOSIS — N946 Dysmenorrhea, unspecified: Secondary | ICD-10-CM | POA: Diagnosis not present

## 2018-09-18 ENCOUNTER — Telehealth: Payer: Self-pay

## 2018-09-18 NOTE — Telephone Encounter (Signed)
RN called and spoke with the patient-some understanding of English/-patient has requested a translator call and speak with her- Will you please call this patient and let her know she needs to take the medication and then submit a stool sample 2 weeks after finishing the medication and also to not take any acid suppression medication for at least 2 weeks prior to the test; The order for the meds and the lab is in [place];

## 2018-09-18 NOTE — Telephone Encounter (Signed)
-----   Message from Mohammed Kindle, RN sent at 08/21/2018  9:35 AM EDT ----- Regarding: needs H.pylori stool lab to be done This patient needs to come in and submit a stool sample for testing for H.pylori.  ORDER IS IN Epic already - Message sent on 08/20/2018 from [place], Dominic Pea, DO  Mohammed Kindle, RN The biopsies from the recent upper GI Endoscopy were notable for normal biopsies with a small intestine, but the biopsies from the stomach demonstrated for H. Pylori gastritis. Will plan on treating with quad therapy as below. Please confirm no medication allergies to the prescribed regimen.   1) Omeprazole 20 mg 2 times a day x 14 d  2) Pepto Bismol 2 tabs (262 mg each) 4 times a day x 14 d  3) Metronidazole 250 mg 4 times a day x 14 d  4) doxycycline 100 mg 2 times a day x 14 d   After 14 days, ok to stop omeprazole.   4 weeks after treatment completed (ON OR AROUND 09/18/2018) =check H. Pylori stool antigen to confirm eradication (must be off acid suppression therapy)   Dx: H. Pylori gastritis

## 2018-09-21 NOTE — Telephone Encounter (Signed)
Please assist this patient in knowing she will not "give" it to him Thanks

## 2018-09-21 NOTE — Telephone Encounter (Signed)
Pt is concerned that her husband will have H. pylori as well and would like to discuss.

## 2018-10-05 NOTE — Telephone Encounter (Signed)
Pt reported that she "has finished her treatment and was told to call for another test."

## 2018-10-05 NOTE — Telephone Encounter (Signed)
Called and spoke with patient-patient informed of need for stool sample to be submitted in 2 weeks as she just finished the antibx for H. Pylori; patient also advised to come to [facility] office for stool sample kit; will place needed specimen container at front desk for pick up; patient advised to call back to the office should questions/concerns arise;

## 2018-10-12 ENCOUNTER — Other Ambulatory Visit (HOSPITAL_COMMUNITY): Payer: BC Managed Care – PPO

## 2018-10-15 ENCOUNTER — Ambulatory Visit (HOSPITAL_BASED_OUTPATIENT_CLINIC_OR_DEPARTMENT_OTHER): Admit: 2018-10-15 | Payer: BC Managed Care – PPO | Admitting: Surgery

## 2018-10-15 ENCOUNTER — Encounter (HOSPITAL_BASED_OUTPATIENT_CLINIC_OR_DEPARTMENT_OTHER): Payer: Self-pay

## 2018-10-15 DIAGNOSIS — Z23 Encounter for immunization: Secondary | ICD-10-CM | POA: Diagnosis not present

## 2018-10-15 SURGERY — LAPAROSCOPIC CHOLECYSTECTOMY
Anesthesia: General

## 2018-11-03 NOTE — Patient Instructions (Addendum)
DUE TO COVID-19 ONLY ONE VISITOR IS ALLOWED TO COME WITH YOU AND STAY IN THE WAITING ROOM ONLY DURING PRE OP AND PROCEDURE DAY OF SURGERY. THE 1 VISITOR MAY VISIT WITH YOU AFTER SURGERY IN YOUR PRIVATE ROOM DURING VISITING HOURS ONLY!  YOU NEED TO HAVE A COVID 19 TEST ON___9-28-2020____ @___3 :15PM____, THIS TEST MUST BE DONE BEFORE SURGERY, COME  [address] , [id number].  ([place]) ONCE YOUR COVID TEST IS COMPLETED, PLEASE BEGIN THE QUARANTINE INSTRUCTIONS AS OUTLINED IN YOUR HANDOUT.                [place]   Your procedure is scheduled on: 11-12-2018   Report to [facility] Main  Entrance    Report to admitting at 8:00AM     Call this number if you have problems the morning of surgery [phone]    Remember: Do not eat food or drink liquids :After Midnight.  BRUSH YOUR TEETH MORNING OF SURGERY AND RINSE YOUR MOUTH OUT, NO CHEWING GUM CANDY OR MINTS.     Take these medicines the morning of surgery with A SIP OF WATER: NONE                                 You may not have any metal on your body including hair pins and              piercings  Do not wear jewelry, make-up, lotions, powders or perfumes, deodorant             Do not wear nail polish.  Do not shave  48 hours prior to surgery.                 Do not bring valuables to the hospital. [place].  Contacts, dentures or bridgework may not be worn into surgery.       Patients discharged the day of surgery will not be allowed to drive home. IF YOU ARE HAVING SURGERY AND GOING HOME THE SAME DAY, YOU MUST HAVE AN ADULT TO DRIVE YOU HOME AND BE WITH YOU FOR 24 HOURS. YOU MAY GO HOME BY TAXI OR UBER OR ORTHERWISE, BUT AN ADULT MUST ACCOMPANY YOU HOME AND STAY WITH YOU FOR 24 HOURS.  Name and phone number of your driver:  Special Instructions: N/A              Please read over the following fact sheets you were  given: _____________________________________________________________________             [facility] - Preparing for Surgery Before surgery, you can play an important role.  Because skin is not sterile, your skin needs to be as free of germs as possible.  You can reduce the number of germs on your skin by washing with CHG (chlorahexidine gluconate) soap before surgery.  CHG is an antiseptic cleaner which kills germs and bonds with the skin to continue killing germs even after washing. Please DO NOT use if you have an allergy to CHG or antibacterial soaps.  If your skin becomes reddened/irritated stop using the CHG and inform your nurse when you arrive at Short Stay. Do not shave (including legs and underarms) for at least 48 hours prior to the first CHG shower.  You may shave your  face/neck. Please follow these instructions carefully:  1.  Shower with CHG Soap the night before surgery and the  morning of Surgery.  2.  If you choose to wash your hair, wash your hair first as usual with your  normal  shampoo.  3.  After you shampoo, rinse your hair and body thoroughly to remove the  shampoo.                           4.  Use CHG as you would any other liquid soap.  You can apply chg directly  to the skin and wash                       Gently with a scrungie or clean washcloth.  5.  Apply the CHG Soap to your body ONLY FROM THE NECK DOWN.   Do not use on face/ open                           Wound or open sores. Avoid contact with eyes, ears mouth and genitals (private parts).                       Wash face,  Genitals (private parts) with your normal soap.             6.  Wash thoroughly, paying special attention to the area where your surgery  will be performed.  7.  Thoroughly rinse your body with warm water from the neck down.  8.  DO NOT shower/wash with your normal soap after using and rinsing off  the CHG Soap.                9.  Pat yourself dry with a clean towel.            10.  Wear  clean pajamas.            11.  Place clean sheets on your bed the night of your first shower and do not  sleep with pets. Day of Surgery : Do not apply any lotions/deodorants the morning of surgery.  Please wear clean clothes to the hospital/surgery center.  FAILURE TO FOLLOW THESE INSTRUCTIONS MAY RESULT IN THE CANCELLATION OF YOUR SURGERY PATIENT SIGNATURE_________________________________  NURSE SIGNATURE__________________________________  ________________________________________________________________________

## 2018-11-05 ENCOUNTER — Encounter (HOSPITAL_COMMUNITY): Payer: Self-pay

## 2018-11-05 ENCOUNTER — Other Ambulatory Visit: Payer: Self-pay

## 2018-11-05 ENCOUNTER — Encounter (HOSPITAL_COMMUNITY)
Admission: RE | Admit: 2018-11-05 | Discharge: 2018-11-05 | Disposition: A | Discharge status: Home / Self Care | Payer: BC Managed Care – PPO | Source: Ambulatory Visit | Attending: Surgery | Admitting: Surgery

## 2018-11-05 DIAGNOSIS — Z01812 Encounter for preprocedural laboratory examination: Secondary | ICD-10-CM | POA: Diagnosis not present

## 2018-11-05 DIAGNOSIS — K828 Other specified diseases of gallbladder: Secondary | ICD-10-CM | POA: Diagnosis not present

## 2018-11-05 LAB — CBC
HCT: 41.6 % (ref 36.0–46.0)
Hemoglobin: 13.6 g/dL (ref 12.0–15.0)
MCH: 29.8 pg (ref 26.0–34.0)
MCHC: 32.7 g/dL (ref 30.0–36.0)
MCV: 91 fL (ref 80.0–100.0)
Platelets: 302 10*3/uL (ref 150–400)
RBC: 4.57 MIL/uL (ref 3.87–5.11)
RDW: 13.1 % (ref 11.5–15.5)
WBC: 6.8 10*3/uL (ref 4.0–10.5)
nRBC: 0 % (ref 0.0–0.2)

## 2018-11-05 NOTE — Progress Notes (Signed)
PCP -  Cardiologist -   Chest x-ray -  EKG -  Stress Test -  ECHO -  Cardiac Cath -   Sleep Study -  CPAP -   Fasting Blood Sugar -  Checks Blood Sugar _____ times a day  Blood Thinner Instructions: Aspirin Instructions: Last Dose:  Anesthesia review:   n/a  Patient denies shortness of breath, fever, cough and chest pain at PAT appointment   Patient verbalized understanding of instructions that were given to them at the PAT appointment. Patient was also instructed that they will need to review over the PAT instructions again at home before surgery.

## 2018-11-06 ENCOUNTER — Other Ambulatory Visit (HOSPITAL_COMMUNITY): Payer: Self-pay | Admitting: Emergency Medicine

## 2018-11-06 LAB — NO BLOOD PRODUCTS

## 2018-11-09 ENCOUNTER — Inpatient Hospital Stay (HOSPITAL_COMMUNITY): Admission: RE | Admit: 2018-11-09 | Payer: BC Managed Care – PPO | Source: Ambulatory Visit

## 2018-11-10 ENCOUNTER — Other Ambulatory Visit (HOSPITAL_COMMUNITY)
Admission: RE | Admit: 2018-11-10 | Discharge: 2018-11-10 | Disposition: A | Discharge status: Home / Self Care | Payer: BC Managed Care – PPO | Source: Ambulatory Visit | Attending: Surgery | Admitting: Surgery

## 2018-11-10 DIAGNOSIS — Z01812 Encounter for preprocedural laboratory examination: Secondary | ICD-10-CM | POA: Diagnosis not present

## 2018-11-10 DIAGNOSIS — Z20828 Contact with and (suspected) exposure to other viral communicable diseases: Secondary | ICD-10-CM | POA: Diagnosis not present

## 2018-11-10 LAB — SARS CORONAVIRUS 2 (TAT 6-24 HRS): SARS Coronavirus 2: NEGATIVE

## 2018-11-11 ENCOUNTER — Encounter (HOSPITAL_COMMUNITY): Payer: Self-pay | Admitting: Anesthesiology

## 2018-11-11 NOTE — H&P (Signed)
Monica Phelps  Location: [facility] Patient #: [id number] DOB: [birth year] Married / Language: English / Race: White Female   History of Present Illness  The patient is a 33 year old female who presents with abdominal pain. This is a pleasant 33 year old female referred by Dr. Lisbeth Ply for evaluation of right upper quadrant abdominal pain. She is actually going to be having a hysterectomy robotically at [facility] on July 27. She is been having right upper quadrant abdominal pain and loose bowel movements for several months. She has had an unremarkable ultrasound for stones but has had a HIDA scan showing a 19% gallbladder ejection fraction. Her mother and her sister have had their gallbladders removed for biliary colic as well. She has been having extensive endometriosis and vaginal bleeding necessitating the hysterectomy. The pain from her gallbladder is moderate in intensity.   Past Surgical History No pertinent past surgical history   Diagnostic Studies History Mammogram  never  Allergies  Morphine Derivatives  Allergies Reconciled   Medication History  No Current Medications Medications Reconciled  Social History Caffeine use  Tea.  Pregnancy / Birth History Maternal age  68-35 Regular periods   Other Problems Bladder Problems     Review of Systems  General Not Present- Appetite Loss, Chills, Fatigue, Fever, Night Sweats, Weight Gain and Weight Loss. Gastrointestinal Present- Abdominal Pain. Not Present- Bloating, Bloody Stool, Change in Bowel Habits, Chronic diarrhea, Constipation, Difficulty Swallowing, Excessive gas, Gets full quickly at meals, Hemorrhoids, Indigestion, Nausea, Rectal Pain and Vomiting. Female Genitourinary Present- Pelvic Pain. Not Present- Frequency, Nocturia, Painful Urination and Urgency.  Vitals  Weight: 152 lb Height: 64in Body Surface Area: 1.74 m Body Mass Index: 26.09 kg/m  Temp.: 98.65F  (Oral)  Pulse: 118 (Regular)  BP: 126/60(Sitting, Left Arm, Standard)     Physical Exam General Mental Status-Alert. General Appearance-Consistent with stated age. Hydration-Well hydrated. Voice-Normal.  Head and Neck Head-normocephalic, atraumatic with no lesions or palpable masses.  Eye Eyeball - Bilateral-Extraocular movements intact. Sclera/Conjunctiva - Bilateral-No scleral icterus.  Chest and Lung Exam Chest and lung exam reveals -quiet, even and easy respiratory effort with no use of accessory muscles and on auscultation, normal breath sounds, no adventitious sounds and normal vocal resonance. Inspection Chest Wall - Normal. Back - normal.  Cardiovascular Cardiovascular examination reveals -on palpation PMI is normal in location and amplitude, no palpable S3 or S4. Normal cardiac borders., normal heart sounds, regular rate and rhythm with no murmurs, carotid auscultation reveals no bruits and normal pedal pulses bilaterally.  Abdomen Inspection Inspection of the abdomen reveals - No Hernias. Skin - Scar - no surgical scars. Palpation/Percussion Palpation and Percussion of the abdomen reveal - Soft, Non Tender, No Rebound tenderness, No Rigidity (guarding) and No hepatosplenomegaly. Auscultation Auscultation of the abdomen reveals - Bowel sounds normal.  Neurologic - Did not examine.  Musculoskeletal - Did not examine.    Assessment & Plan  BILIARY DYSKINESIA (K82.8)  Impression: I had a long discussion with the patient regarding gallbladder disease. We discussed proceeding with laparoscopic cholecystectomy. I think it is more urgent that she have her robotic hysterectomy at [facility] and then we can do the gallbladder at a later date. I discussed the reasons for this with her in detail. We discussed the risks of surgery which includes but is not limited to bleeding, infection, injury to surrounding structures, the need to convert to an  open procedure, cardiopulmonary issues, postoperative recovery, etc. She understands and will schedule surgery  after she has recovered from her hysterectomy.

## 2018-11-11 NOTE — Anesthesia Preprocedure Evaluation (Addendum)
Anesthesia Evaluation  Patient identified by MRN, date of birth, ID band Patient awake    Reviewed: Allergy & Precautions, NPO status , Patient's Chart, lab work & pertinent test results  History of Anesthesia Complications (+) PONV and history of anesthetic complications  Airway Mallampati: I       Dental no notable dental hx. (+) Teeth Intact   Pulmonary    Pulmonary exam normal breath sounds clear to auscultation       Cardiovascular negative cardio ROS Normal cardiovascular exam Rhythm:Regular Rate:Normal     Neuro/Psych    GI/Hepatic GERD  Medicated and Controlled,  Endo/Other  negative endocrine ROS  Renal/GU   negative genitourinary   Musculoskeletal negative musculoskeletal ROS (+)   Abdominal Normal abdominal exam  (+)   Peds  Hematology   Anesthesia Other Findings   Reproductive/Obstetrics negative OB ROS                            Anesthesia Physical Anesthesia Plan  ASA: II  Anesthesia Plan: General   Post-op Pain Management:    Induction: Intravenous  PONV Risk Score and Plan: Scopolamine patch - Pre-op, Midazolam, Dexamethasone and Ondansetron  Airway Management Planned: Oral ETT  Additional Equipment: None  Intra-op Plan:   Post-operative Plan: Extubation in OR  Informed Consent: I have reviewed the patients History and Physical, chart, labs and discussed the procedure including the risks, benefits and alternatives for the proposed anesthesia with the patient or authorized representative who has indicated his/her understanding and acceptance.     Dental advisory given  Plan Discussed with: CRNA  Anesthesia Plan Comments:        Anesthesia Quick Evaluation

## 2018-11-12 ENCOUNTER — Ambulatory Visit (HOSPITAL_COMMUNITY): Payer: BC Managed Care – PPO | Admitting: Physician Assistant

## 2018-11-12 ENCOUNTER — Ambulatory Visit (HOSPITAL_COMMUNITY)
Admission: RE | Admit: 2018-11-12 | Discharge: 2018-11-12 | Disposition: A | Discharge status: Home / Self Care | Payer: BC Managed Care – PPO | Attending: Surgery | Admitting: Surgery

## 2018-11-12 ENCOUNTER — Ambulatory Visit (HOSPITAL_COMMUNITY): Payer: BC Managed Care – PPO

## 2018-11-12 ENCOUNTER — Encounter (HOSPITAL_COMMUNITY): Payer: Self-pay | Admitting: Certified Registered Nurse Anesthetist

## 2018-11-12 ENCOUNTER — Encounter (HOSPITAL_COMMUNITY)
Admission: RE | Disposition: A | Discharge status: Home / Self Care | Payer: Self-pay | Source: Home / Self Care | Attending: Surgery

## 2018-11-12 DIAGNOSIS — K828 Other specified diseases of gallbladder: Secondary | ICD-10-CM | POA: Diagnosis not present

## 2018-11-12 DIAGNOSIS — K811 Chronic cholecystitis: Secondary | ICD-10-CM | POA: Diagnosis not present

## 2018-11-12 DIAGNOSIS — K219 Gastro-esophageal reflux disease without esophagitis: Secondary | ICD-10-CM | POA: Insufficient documentation

## 2018-11-12 DIAGNOSIS — N803 Endometriosis of pelvic peritoneum: Secondary | ICD-10-CM | POA: Diagnosis not present

## 2018-11-12 DIAGNOSIS — R109 Unspecified abdominal pain: Secondary | ICD-10-CM | POA: Diagnosis present

## 2018-11-12 HISTORY — PX: CHOLECYSTECTOMY: SHX55

## 2018-11-12 SURGERY — LAPAROSCOPIC CHOLECYSTECTOMY
Anesthesia: General | Site: Abdomen

## 2018-11-12 MED ORDER — FENTANYL CITRATE (PF) 100 MCG/2ML IJ SOLN
INTRAMUSCULAR | Status: AC
  Filled 2018-11-12: qty 2

## 2018-11-12 MED ORDER — MIDAZOLAM HCL 2 MG/2ML IJ SOLN
INTRAMUSCULAR | Status: AC
  Filled 2018-11-12: qty 2

## 2018-11-12 MED ORDER — DEXAMETHASONE SODIUM PHOSPHATE 10 MG/ML IJ SOLN
INTRAMUSCULAR | Status: AC
  Filled 2018-11-12: qty 1

## 2018-11-12 MED ORDER — PROPOFOL 10 MG/ML IV BOLUS
INTRAVENOUS | Status: DC | PRN
  Administered 2018-11-12: 160 mg via INTRAVENOUS

## 2018-11-12 MED ORDER — ONDANSETRON HCL 4 MG/2ML IJ SOLN
INTRAMUSCULAR | Status: AC
  Filled 2018-11-12: qty 2

## 2018-11-12 MED ORDER — IBUPROFEN 100 MG/5ML PO SUSP
200.0000 mg | Freq: Four times a day (QID) | ORAL | Status: DC | PRN
  Filled 2018-11-12: qty 20

## 2018-11-12 MED ORDER — SUGAMMADEX SODIUM 200 MG/2ML IV SOLN
INTRAVENOUS | Status: DC | PRN
  Administered 2018-11-12: 200 mg via INTRAVENOUS

## 2018-11-12 MED ORDER — PROMETHAZINE HCL 25 MG/ML IJ SOLN
12.5000 mg | Freq: Once | INTRAMUSCULAR | Status: AC
  Administered 2018-11-12: 11:00:00 12.5 mg via INTRAVENOUS

## 2018-11-12 MED ORDER — CELECOXIB 200 MG PO CAPS
200.0000 mg | ORAL_CAPSULE | ORAL | Status: AC
  Administered 2018-11-12: 200 mg via ORAL
  Filled 2018-11-12: qty 1

## 2018-11-12 MED ORDER — CHLORHEXIDINE GLUCONATE CLOTH 2 % EX PADS: 6.0000 | MEDICATED_PAD | Freq: Once | CUTANEOUS | Status: DC

## 2018-11-12 MED ORDER — ACETAMINOPHEN 500 MG PO TABS
1000.0000 mg | ORAL_TABLET | ORAL | Status: AC
  Administered 2018-11-12: 1000 mg via ORAL
  Filled 2018-11-12: qty 2

## 2018-11-12 MED ORDER — LIDOCAINE 2% (20 MG/ML) 5 ML SYRINGE
INTRAMUSCULAR | Status: DC | PRN
  Administered 2018-11-12: 60 mg via INTRAVENOUS

## 2018-11-12 MED ORDER — LACTATED RINGERS IV SOLN
INTRAVENOUS | Status: DC
  Administered 2018-11-12 (×2): via INTRAVENOUS

## 2018-11-12 MED ORDER — FENTANYL CITRATE (PF) 250 MCG/5ML IJ SOLN
INTRAMUSCULAR | Status: AC
  Filled 2018-11-12: qty 5

## 2018-11-12 MED ORDER — PROMETHAZINE HCL 25 MG/ML IJ SOLN
INTRAMUSCULAR | Status: AC
  Filled 2018-11-12: qty 1

## 2018-11-12 MED ORDER — TRAMADOL HCL 50 MG PO TABS: 50.0000 mg | ORAL_TABLET | Freq: Four times a day (QID) | ORAL | 0 refills | Status: AC | PRN

## 2018-11-12 MED ORDER — SCOPOLAMINE 1 MG/3DAYS TD PT72
MEDICATED_PATCH | TRANSDERMAL | Status: DC | PRN
  Administered 2018-11-12: 1 via TRANSDERMAL

## 2018-11-12 MED ORDER — LIDOCAINE 2% (20 MG/ML) 5 ML SYRINGE
INTRAMUSCULAR | Status: AC
  Filled 2018-11-12: qty 5

## 2018-11-12 MED ORDER — PHENYLEPHRINE 40 MCG/ML (10ML) SYRINGE FOR IV PUSH (FOR BLOOD PRESSURE SUPPORT)
PREFILLED_SYRINGE | INTRAVENOUS | Status: DC | PRN
  Administered 2018-11-12 (×2): 80 ug via INTRAVENOUS

## 2018-11-12 MED ORDER — KETOROLAC TROMETHAMINE 30 MG/ML IJ SOLN
30.0000 mg | Freq: Once | INTRAMUSCULAR | Status: AC | PRN
  Administered 2018-11-12: 30 mg via INTRAVENOUS

## 2018-11-12 MED ORDER — PROPOFOL 10 MG/ML IV BOLUS
INTRAVENOUS | Status: AC
  Filled 2018-11-12: qty 20

## 2018-11-12 MED ORDER — IBUPROFEN 200 MG PO TABS: 200.0000 mg | ORAL_TABLET | Freq: Four times a day (QID) | ORAL | Status: DC | PRN

## 2018-11-12 MED ORDER — SUCCINYLCHOLINE CHLORIDE 20 MG/ML IJ SOLN
INTRAMUSCULAR | Status: DC | PRN
  Administered 2018-11-12: 100 mg via INTRAVENOUS

## 2018-11-12 MED ORDER — ROCURONIUM BROMIDE 10 MG/ML (PF) SYRINGE
PREFILLED_SYRINGE | INTRAVENOUS | Status: DC | PRN
  Administered 2018-11-12: 40 mg via INTRAVENOUS

## 2018-11-12 MED ORDER — FENTANYL CITRATE (PF) 100 MCG/2ML IJ SOLN
INTRAMUSCULAR | Status: DC | PRN
  Administered 2018-11-12 (×2): 100 ug via INTRAVENOUS

## 2018-11-12 MED ORDER — DEXAMETHASONE SODIUM PHOSPHATE 10 MG/ML IJ SOLN
INTRAMUSCULAR | Status: DC | PRN
  Administered 2018-11-12: 5 mg via INTRAVENOUS

## 2018-11-12 MED ORDER — LACTATED RINGERS IR SOLN
Status: DC | PRN
  Administered 2018-11-12: 1000 mL

## 2018-11-12 MED ORDER — CEFAZOLIN SODIUM-DEXTROSE 2-4 GM/100ML-% IV SOLN
2.0000 g | INTRAVENOUS | Status: AC
  Administered 2018-11-12: 2 g via INTRAVENOUS
  Filled 2018-11-12: qty 100

## 2018-11-12 MED ORDER — KETOROLAC TROMETHAMINE 30 MG/ML IJ SOLN
INTRAMUSCULAR | Status: AC
  Filled 2018-11-12: qty 1

## 2018-11-12 MED ORDER — BUPIVACAINE HCL (PF) 0.5 % IJ SOLN
INTRAMUSCULAR | Status: AC
  Filled 2018-11-12: qty 30

## 2018-11-12 MED ORDER — 0.9 % SODIUM CHLORIDE (POUR BTL) OPTIME
TOPICAL | Status: DC | PRN
  Administered 2018-11-12: 10:00:00 1000 mL

## 2018-11-12 MED ORDER — ONDANSETRON HCL 4 MG/2ML IJ SOLN
INTRAMUSCULAR | Status: DC | PRN
  Administered 2018-11-12: 4 mg via INTRAVENOUS

## 2018-11-12 MED ORDER — METOCLOPRAMIDE HCL 5 MG/ML IJ SOLN
INTRAMUSCULAR | Status: AC
  Administered 2018-11-12: 10 mg
  Filled 2018-11-12: qty 2

## 2018-11-12 MED ORDER — MEPERIDINE HCL 50 MG/ML IJ SOLN: 6.2500 mg | INTRAMUSCULAR | Status: DC | PRN

## 2018-11-12 MED ORDER — BUPIVACAINE HCL (PF) 0.5 % IJ SOLN
INTRAMUSCULAR | Status: DC | PRN
  Administered 2018-11-12: 20 mL

## 2018-11-12 MED ORDER — FENTANYL CITRATE (PF) 100 MCG/2ML IJ SOLN
25.0000 ug | INTRAMUSCULAR | Status: DC | PRN
  Administered 2018-11-12: 50 ug via INTRAVENOUS

## 2018-11-12 MED ORDER — ONDANSETRON HCL 4 MG/2ML IJ SOLN: 4.0000 mg | Freq: Once | INTRAMUSCULAR | Status: DC | PRN

## 2018-11-12 MED ORDER — SCOPOLAMINE 1 MG/3DAYS TD PT72
MEDICATED_PATCH | TRANSDERMAL | Status: AC
  Filled 2018-11-12: qty 1

## 2018-11-12 MED ORDER — GABAPENTIN 300 MG PO CAPS
300.0000 mg | ORAL_CAPSULE | ORAL | Status: AC
  Administered 2018-11-12: 300 mg via ORAL
  Filled 2018-11-12: qty 1

## 2018-11-12 MED ORDER — MIDAZOLAM HCL 5 MG/5ML IJ SOLN
INTRAMUSCULAR | Status: DC | PRN
  Administered 2018-11-12: 2 mg via INTRAVENOUS

## 2018-11-12 SURGICAL SUPPLY — 28 items
APPLIER CLIP 5 13 M/L LIGAMAX5 (MISCELLANEOUS) ×3
CABLE HIGH FREQUENCY MONO STRZ (ELECTRODE) ×3 IMPLANT
CHLORAPREP W/TINT 26 (MISCELLANEOUS) ×3 IMPLANT
CLIP APPLIE 5 13 M/L LIGAMAX5 (MISCELLANEOUS) ×1 IMPLANT
COVER MAYO STAND STRL (DRAPES) IMPLANT
COVER WAND RF STERILE (DRAPES) ×3 IMPLANT
DECANTER SPIKE VIAL GLASS SM (MISCELLANEOUS) ×3 IMPLANT
DERMABOND ADVANCED (GAUZE/BANDAGES/DRESSINGS) ×2
DERMABOND ADVANCED .7 DNX12 (GAUZE/BANDAGES/DRESSINGS) ×1 IMPLANT
DRAPE C-ARM 42X120 X-RAY (DRAPES) IMPLANT
ELECT REM PT RETURN 15FT ADLT (MISCELLANEOUS) ×3 IMPLANT
GLOVE SURG SIGNA 7.5 PF LTX (GLOVE) ×3 IMPLANT
GOWN STRL REUS W/TWL XL LVL3 (GOWN DISPOSABLE) ×6 IMPLANT
HEMOSTAT SURGICEL 4X8 (HEMOSTASIS) IMPLANT
KIT BASIN OR (CUSTOM PROCEDURE TRAY) ×3 IMPLANT
KIT TURNOVER KIT A (KITS) IMPLANT
POUCH SPECIMEN RETRIEVAL 10MM (ENDOMECHANICALS) ×3 IMPLANT
SCISSORS LAP 5X35 DISP (ENDOMECHANICALS) ×3 IMPLANT
SET CHOLANGIOGRAPH MIX (MISCELLANEOUS) IMPLANT
SET IRRIG TUBING LAPAROSCOPIC (IRRIGATION / IRRIGATOR) ×3 IMPLANT
SET TUBE SMOKE EVAC HIGH FLOW (TUBING) ×3 IMPLANT
SLEEVE XCEL OPT CAN 5 100 (ENDOMECHANICALS) ×6 IMPLANT
SUT MNCRL AB 4-0 PS2 18 (SUTURE) ×3 IMPLANT
TOWEL OR 17X26 10 PK STRL BLUE (TOWEL DISPOSABLE) ×3 IMPLANT
TOWEL OR NON WOVEN STRL DISP B (DISPOSABLE) ×3 IMPLANT
TRAY LAPAROSCOPIC (CUSTOM PROCEDURE TRAY) ×3 IMPLANT
TROCAR BLADELESS OPT 5 100 (ENDOMECHANICALS) ×3 IMPLANT
TROCAR XCEL BLUNT TIP 100MML (ENDOMECHANICALS) ×3 IMPLANT

## 2018-11-12 NOTE — Anesthesia Postprocedure Evaluation (Signed)
Anesthesia Post Note  Patient: Monica Phelps  Procedure(s) Performed: LAPAROSCOPIC CHOLECYSTECTOMY (N/A Abdomen)     Patient location during evaluation: PACU Anesthesia Type: General Level of consciousness: awake Pain management: pain level controlled Vital Signs Assessment: post-procedure vital signs reviewed and stable Respiratory status: spontaneous breathing Cardiovascular status: stable Postop Assessment: no apparent nausea or vomiting Anesthetic complications: no    Last Vitals:  Vitals:   11/12/18 1052 11/12/18 1100  BP:  123/74  Pulse: 90 68  Resp: 18 15  Temp:  36.6 C  SpO2: 95% 95%    Last Pain:  Vitals:   11/12/18 1052  TempSrc:   PainSc: Asleep   Pain Goal: Patients Stated Pain Goal: 4 (11/12/18 0826)                 Huston Foley

## 2018-11-12 NOTE — Transfer of Care (Signed)
Immediate Anesthesia Transfer of Care Note  Patient: Monica Phelps  Procedure(s) Performed: LAPAROSCOPIC CHOLECYSTECTOMY (N/A Abdomen)  Patient Location: PACU  Anesthesia Type:General  Level of Consciousness: awake, alert  and oriented  Airway & Oxygen Therapy: Patient Spontanous Breathing and Patient connected to face mask oxygen  Post-op Assessment: Report given to RN and Post -op Vital signs reviewed and stable  Post vital signs: Reviewed and stable  Last Vitals:  Vitals Value Taken Time  BP 139/94 11/12/18 1022  Temp    Pulse    Resp 20 11/12/18 1025  SpO2    Vitals shown include unvalidated device data.  Last Pain:  Vitals:   11/12/18 0826  TempSrc: Oral  PainSc: 0-No pain      Patients Stated Pain Goal: 4 ([id number])  Complications: No apparent anesthesia complications

## 2018-11-12 NOTE — Interval H&P Note (Signed)
History and Physical Interval Note: no change in H and P  11/12/2018 8:58 AM  Monica Phelps  has presented today for surgery, with the diagnosis of BILIARY DYSKENESIS.  The various methods of treatment have been discussed with the patient and family. After consideration of risks, benefits and other options for treatment, the patient has consented to  Procedure(s): LAPAROSCOPIC CHOLECYSTECTOMY (N/A) as a surgical intervention.  The patient's history has been reviewed, patient examined, no change in status, stable for surgery.  I have reviewed the patient's chart and labs.  Questions were answered to the patient's satisfaction.     Coralie Keens

## 2018-11-12 NOTE — Anesthesia Procedure Notes (Signed)
Procedure Name: Intubation Performed by: Gean Maidens, CRNA Pre-anesthesia Checklist: Patient identified, Emergency Drugs available, Suction available, Patient being monitored and Timeout performed Patient Re-evaluated:Patient Re-evaluated prior to induction Oxygen Delivery Method: Circle system utilized Preoxygenation: Pre-oxygenation with 100% oxygen Induction Type: IV induction Ventilation: Mask ventilation without difficulty Laryngoscope Size: Mac and 3 Grade View: Grade I Tube type: Oral Number of attempts: 1 Airway Equipment and Method: Stylet Placement Confirmation: ETT inserted through vocal cords under direct vision,  positive ETCO2 and breath sounds checked- equal and bilateral Secured at: 21 cm Tube secured with: Tape Dental Injury: Teeth and Oropharynx as per pre-operative assessment

## 2018-11-12 NOTE — Op Note (Signed)
Laparoscopic Cholecystectomy Procedure Note  Indications: This patient presents with symptomatic gallbladder disease and will undergo laparoscopic cholecystectomy.  Pre-operative Diagnosis: biliary dyskinesia  Post-operative Diagnosis: Same  Surgeon: Coralie Keens   Assistants: 0  Anesthesia: General endotracheal anesthesia  ASA Class: 1  Procedure Details  The patient was seen again in the Holding Room. The risks, benefits, complications, treatment options, and expected outcomes were discussed with the patient. The possibilities of reaction to medication, pulmonary aspiration, perforation of viscus, bleeding, recurrent infection, finding a normal gallbladder, the need for additional procedures, failure to diagnose a condition, the possible need to convert to an open procedure, and creating a complication requiring transfusion or operation were discussed with the patient. The likelihood of improving the patient's symptoms with return to their baseline status is good.  The patient and/or family concurred with the proposed plan, giving informed consent. The site of surgery properly noted. The patient was taken to Operating Room, identified as Monica Phelps and the procedure verified as Laparoscopic Cholecystectomy with Intraoperative Cholangiogram. A Time Out was held and the above information confirmed.  Prior to the induction of general anesthesia, antibiotic prophylaxis was administered. General endotracheal anesthesia was then administered and tolerated well. After the induction, the abdomen was prepped with Chloraprep and draped in sterile fashion. The patient was positioned in the supine position.  Local anesthetic agent was injected into the skin near the umbilicus and an incision made. We dissected down to the abdominal fascia with blunt dissection.  The fascia was incised vertically and we entered the peritoneal cavity bluntly.  A pursestring suture of 0-Vicryl was placed  around the fascial opening.  The Hasson cannula was inserted and secured with the stay suture.  Pneumoperitoneum was then created with CO2 and tolerated well without any adverse changes in the patient's vital signs. A 5-mm port was placed in the subxiphoid position.  Two 5-mm ports were placed in the right upper quadrant. All skin incisions were infiltrated with a local anesthetic agent before making the incision and placing the trocars.   We positioned the patient in reverse Trendelenburg, tilted slightly to the patient's left.  The gallbladder was identified, the fundus grasped and retracted cephalad. Adhesions were lysed bluntly and with the electrocautery where indicated, taking care not to injure any adjacent organs or viscus. The infundibulum was grasped and retracted laterally, exposing the peritoneum overlying the triangle of Calot. This was then divided and exposed in a blunt fashion. The cystic duct was clearly identified and bluntly dissected circumferentially. A critical view of the cystic duct and cystic artery was obtained.  The cystic duct was then ligated with clips and divided. The cystic artery was, dissected free, ligated with clips and divided as well.   The gallbladder was dissected from the liver bed in retrograde fashion with the electrocautery. The gallbladder was removed and placed in an Endocatch sac. The liver bed was irrigated and inspected. Hemostasis was achieved with the electrocautery. Copious irrigation was utilized and was repeatedly aspirated until clear.  The gallbladder and Endocatch sac were then removed through the umbilical port site.  The pursestring suture was used to close the umbilical fascia.    We again inspected the right upper quadrant for hemostasis.  Pneumoperitoneum was released as we removed the trocars.  4-0 Monocryl was used to close the skin.  Skin glue was then applied. The patient was then extubated and brought to the recovery room in stable condition.  Instrument, sponge, and needle counts were correct  at closure and at the conclusion of the case.   Findings: Chronic Cholecystitis with possible Cholelithiasis  Estimated Blood Loss: Minimal         Drains: 0         Specimens: Gallbladder           Complications: None; patient tolerated the procedure well.         Disposition: PACU - hemodynamically stable.         Condition: stable

## 2018-11-12 NOTE — Discharge Instructions (Signed)
[facility] LAPAROSCOPIC SURGERY: POST OP INSTRUCTIONS Always review your discharge instruction sheet given to you by the facility where your surgery was performed. IF YOU HAVE DISABILITY OR FAMILY LEAVE FORMS, YOU MUST BRING THEM TO THE OFFICE FOR PROCESSING.   DO NOT GIVE THEM TO YOUR DOCTOR.  1. A prescription for pain medication may be given to you upon discharge.  Take your pain medication as prescribed, if needed.  If narcotic pain medicine is not needed, then you may take acetaminophen (Tylenol) or ibuprofen (Advil) as needed. 2. Take your usually prescribed medications unless otherwise directed. 3. If you need a refill on your pain medication, please contact your pharmacy.  They will contact our office to request authorization. Prescriptions will not be filled after 5pm or on week-ends. 4. You should follow a light diet the first few days after arrival home, such as soup and crackers, etc.  Be sure to include lots of fluids daily. 5. Most patients will experience some swelling and bruising in the area of the incisions.  Ice packs will help.  Swelling and bruising can take several days to resolve.  6. It is common to experience some constipation if taking pain medication after surgery.  Increasing fluid intake and taking a stool softener (such as Colace) will usually help or prevent this problem from occurring.  A mild laxative (Milk of Magnesia or Miralax) should be taken according to package instructions if there are no bowel movements after 48 hours. 7. Unless discharge instructions indicate otherwise, you may remove your bandages 24-48 hours after surgery, and you may shower at that time.  You may have steri-strips (small skin tapes) in place directly over the incision.  These strips should be left on the skin for 7-10 days.  If your surgeon used skin glue on the incision, you may shower in 24 hours.  The glue will flake off over the next 2-3 weeks.  Any sutures or  staples will be removed at the office during your follow-up visit. 8. ACTIVITIES:  You may resume regular (light) daily activities beginning the next day--such as daily self-care, walking, climbing stairs--gradually increasing activities as tolerated.  You may have sexual intercourse when it is comfortable.  Refrain from any heavy lifting or straining until approved by your doctor. a. You may drive when you are no longer taking prescription pain medication, you can comfortably wear a seatbelt, and you can safely maneuver your car and apply brakes. b. RETURN TO WORK:  __________________________________________________________ 9. You should see your doctor in the office for a follow-up appointment approximately 2-3 weeks after your surgery.  Make sure that you call for this appointment within a day or two after you arrive home to insure a convenient appointment time. 10. OTHER INSTRUCTIONS:OK TO SHOWER STARTING THIS EVENING 11. ICE PACK, TYLENOL, IBUPROFEN ALSO FOR PAIN 12. NO LIFTING MORE THAN 15 POUNDS FOR 2 WEEKS __________________________________________________________________________________________________________________________ __________________________________________________________________________________________________________________________ WHEN TO CALL YOUR DOCTOR: 1. Fever over 101.0 2. Inability to urinate 3. Continued bleeding from incision. 4. Increased pain, redness, or drainage from the incision. 5. Increasing abdominal pain  The clinic staff is available to answer your questions during regular business hours.  Please dont hesitate to call and ask to speak to one of the nurses for clinical concerns.  If you have a medical emergency, go to the nearest emergency room or call 911.  A surgeon from [facility] is always on call at the hospital. [address] [place],  [place], [place]  [address] ? [address], [place], [place]   [phone] ?  [phone] ? FAX [phone] Web site: [email or web address]   General Anesthesia, Adult, Care After This sheet gives you information about how to care for yourself after your procedure. Your health care provider may also give you more specific instructions. If you have problems or questions, contact your health care provider. What can I expect after the procedure? After the procedure, the following side effects are common:  Pain or discomfort at the IV site.  Nausea.  Vomiting.  Sore throat.  Trouble concentrating.  Feeling cold or chills.  Weak or tired.  Sleepiness and fatigue.  Soreness and body aches. These side effects can affect parts of the body that were not involved in surgery. Follow these instructions at home:  For at least 24 hours after the procedure:  Have a responsible adult stay with you. It is important to have someone help care for you until you are awake and alert.  Rest as needed.  Do not: ? Participate in activities in which you could fall or become injured. ? Drive. ? Use heavy machinery. ? Drink alcohol. ? Take sleeping pills or medicines that cause drowsiness. ? Make important decisions or sign legal documents. ? Take care of children on your own. Eating and drinking  Follow any instructions from your health care provider about eating or drinking restrictions.  When you feel hungry, start by eating small amounts of foods that are soft and easy to digest (bland), such as toast. Gradually return to your regular diet.  Drink enough fluid to keep your urine pale yellow.  If you vomit, rehydrate by drinking water, juice, or clear broth. General instructions  If you have sleep apnea, surgery and certain medicines can increase your risk for breathing problems. Follow instructions from your health care provider about wearing your sleep device: ? Anytime you are sleeping, including during daytime naps. ? While taking prescription  pain medicines, sleeping medicines, or medicines that make you drowsy.  Return to your normal activities as told by your health care provider. Ask your health care provider what activities are safe for you.  Take over-the-counter and prescription medicines only as told by your health care provider.  If you smoke, do not smoke without supervision.  Keep all follow-up visits as told by your health care provider. This is important. Contact a health care provider if:  You have nausea or vomiting that does not get better with medicine.  You cannot eat or drink without vomiting.  You have pain that does not get better with medicine.  You are unable to pass urine.  You develop a skin rash.  You have a fever.  You have redness around your IV site that gets worse. Get help right away if:  You have difficulty breathing.  You have chest pain.  You have blood in your urine or stool, or you vomit blood. Summary  After the procedure, it is common to have a sore throat or nausea. It is also common to feel tired.  Have a responsible adult stay with you for the first 24 hours after general anesthesia. It is important to have someone help care for you until you are awake and alert.  When you feel hungry, start by eating small amounts of foods that are soft and easy to digest (bland), such as toast. Gradually return to your regular diet.  Drink enough fluid to keep your urine pale yellow.  Return to your normal activities as told by your health care provider. Ask your health care provider what activities are safe for you. °This information is not intended to replace advice given to you by your health care provider. Make sure you discuss any questions you have with your health care provider. °Document Released: 05/06/2000 Document Revised: 01/31/2017 Document Reviewed: 09/13/2016 °Elsevier Patient Education © 2020 [employer] ° °

## 2018-11-13 ENCOUNTER — Encounter (HOSPITAL_COMMUNITY): Payer: Self-pay | Admitting: Surgery

## 2018-11-13 LAB — SURGICAL PATHOLOGY

## 2018-11-15 ENCOUNTER — Other Ambulatory Visit: Payer: Self-pay

## 2018-11-15 ENCOUNTER — Emergency Department (HOSPITAL_COMMUNITY): Payer: BC Managed Care – PPO

## 2018-11-15 ENCOUNTER — Encounter (HOSPITAL_COMMUNITY): Payer: Self-pay | Admitting: Obstetrics and Gynecology

## 2018-11-15 ENCOUNTER — Emergency Department (HOSPITAL_COMMUNITY)
Admission: EM | Admit: 2018-11-15 | Discharge: 2018-11-16 | Disposition: A | Discharge status: Home / Self Care | Payer: BC Managed Care – PPO | Attending: Emergency Medicine | Admitting: Emergency Medicine

## 2018-11-15 DIAGNOSIS — G8918 Other acute postprocedural pain: Secondary | ICD-10-CM

## 2018-11-15 DIAGNOSIS — Z79899 Other long term (current) drug therapy: Secondary | ICD-10-CM | POA: Insufficient documentation

## 2018-11-15 DIAGNOSIS — Z91040 Latex allergy status: Secondary | ICD-10-CM | POA: Insufficient documentation

## 2018-11-15 DIAGNOSIS — R1011 Right upper quadrant pain: Secondary | ICD-10-CM | POA: Insufficient documentation

## 2018-11-15 DIAGNOSIS — R110 Nausea: Secondary | ICD-10-CM | POA: Diagnosis not present

## 2018-11-15 DIAGNOSIS — R5383 Other fatigue: Secondary | ICD-10-CM | POA: Diagnosis not present

## 2018-11-15 DIAGNOSIS — R42 Dizziness and giddiness: Secondary | ICD-10-CM | POA: Diagnosis not present

## 2018-11-15 DIAGNOSIS — K829 Disease of gallbladder, unspecified: Secondary | ICD-10-CM | POA: Diagnosis not present

## 2018-11-15 DIAGNOSIS — J45909 Unspecified asthma, uncomplicated: Secondary | ICD-10-CM | POA: Insufficient documentation

## 2018-11-15 DIAGNOSIS — R002 Palpitations: Secondary | ICD-10-CM | POA: Diagnosis not present

## 2018-11-15 LAB — CBC WITH DIFFERENTIAL/PLATELET
Abs Immature Granulocytes: 0.03 10*3/uL (ref 0.00–0.07)
Basophils Absolute: 0 10*3/uL (ref 0.0–0.1)
Basophils Relative: 0 %
Eosinophils Absolute: 0.1 10*3/uL (ref 0.0–0.5)
Eosinophils Relative: 1 %
HCT: 41.9 % (ref 36.0–46.0)
Hemoglobin: 13.9 g/dL (ref 12.0–15.0)
Immature Granulocytes: 0 %
Lymphocytes Relative: 16 %
Lymphs Abs: 1.6 10*3/uL (ref 0.7–4.0)
MCH: 30.2 pg (ref 26.0–34.0)
MCHC: 33.2 g/dL (ref 30.0–36.0)
MCV: 91.1 fL (ref 80.0–100.0)
Monocytes Absolute: 0.7 10*3/uL (ref 0.1–1.0)
Monocytes Relative: 7 %
Neutro Abs: 7.2 10*3/uL (ref 1.7–7.7)
Neutrophils Relative %: 76 %
Platelets: 260 10*3/uL (ref 150–400)
RBC: 4.6 MIL/uL (ref 3.87–5.11)
RDW: 12.8 % (ref 11.5–15.5)
WBC: 9.7 10*3/uL (ref 4.0–10.5)
nRBC: 0 % (ref 0.0–0.2)

## 2018-11-15 LAB — URINALYSIS, ROUTINE W REFLEX MICROSCOPIC
Bilirubin Urine: NEGATIVE
Glucose, UA: NEGATIVE mg/dL
Hgb urine dipstick: NEGATIVE
Ketones, ur: NEGATIVE mg/dL
Leukocytes,Ua: NEGATIVE
Nitrite: NEGATIVE
Protein, ur: NEGATIVE mg/dL
Specific Gravity, Urine: 1.004 — ABNORMAL LOW (ref 1.005–1.030)
pH: 7 (ref 5.0–8.0)

## 2018-11-15 LAB — COMPREHENSIVE METABOLIC PANEL
ALT: 28 U/L (ref 0–44)
AST: 37 U/L (ref 15–41)
Albumin: 4.4 g/dL (ref 3.5–5.0)
Alkaline Phosphatase: 62 U/L (ref 38–126)
Anion gap: 9 (ref 5–15)
BUN: 10 mg/dL (ref 6–20)
CO2: 24 mmol/L (ref 22–32)
Calcium: 9.1 mg/dL (ref 8.9–10.3)
Chloride: 101 mmol/L (ref 98–111)
Creatinine, Ser: 0.64 mg/dL (ref 0.44–1.00)
GFR calc Af Amer: >60 mL/min (ref >60)
GFR calc non Af Amer: >60 mL/min (ref >60)
Glucose, Bld: 106 mg/dL — ABNORMAL HIGH (ref 70–99)
Potassium: 4.5 mmol/L (ref 3.5–5.1)
Sodium: 134 mmol/L — ABNORMAL LOW (ref 135–145)
Total Bilirubin: 0.8 mg/dL (ref 0.3–1.2)
Total Protein: 7.5 g/dL (ref 6.5–8.1)

## 2018-11-15 LAB — AMMONIA: Ammonia: 36 umol/L — ABNORMAL HIGH (ref 9–35)

## 2018-11-15 LAB — LIPASE, BLOOD: Lipase: 24 U/L (ref 11–51)

## 2018-11-15 MED ORDER — SODIUM CHLORIDE 0.9 % IV BOLUS
1000.0000 mL | Freq: Once | INTRAVENOUS | Status: AC
  Administered 2018-11-15: 1000 mL via INTRAVENOUS

## 2018-11-15 MED ORDER — SODIUM CHLORIDE (PF) 0.9 % IJ SOLN
INTRAMUSCULAR | Status: AC
  Filled 2018-11-15: qty 50

## 2018-11-15 NOTE — ED Triage Notes (Signed)
Patient reports to the ED states she feels dizzy and has nausea following galbladder surgery.  Patient is alert and oriented. Patient reports she had a covid test before the surgery and it was negative.  Patient reports she is concerned about infection

## 2018-11-16 ENCOUNTER — Emergency Department (HOSPITAL_COMMUNITY): Payer: BC Managed Care – PPO

## 2018-11-16 ENCOUNTER — Encounter (HOSPITAL_COMMUNITY): Payer: Self-pay

## 2018-11-16 DIAGNOSIS — R42 Dizziness and giddiness: Secondary | ICD-10-CM | POA: Diagnosis not present

## 2018-11-16 DIAGNOSIS — R110 Nausea: Secondary | ICD-10-CM | POA: Diagnosis not present

## 2018-11-16 DIAGNOSIS — K829 Disease of gallbladder, unspecified: Secondary | ICD-10-CM | POA: Diagnosis not present

## 2018-11-16 MED ORDER — IOHEXOL 350 MG/ML SOLN
100.0000 mL | Freq: Once | INTRAVENOUS | Status: AC | PRN
  Administered 2018-11-16: 100 mL via INTRAVENOUS

## 2018-11-16 NOTE — Discharge Instructions (Addendum)
Follow up with your surgeon if your symptom are not improving.  Return to the ER with any new, worsening, or concerning symptoms.

## 2018-11-16 NOTE — ED Provider Notes (Signed)
[facility] DEPT Provider Note   CSN: [id number] Arrival date & time: 11/15/18  2024     History   Chief Complaint Chief Complaint  Patient presents with   Post-op Problem    HPI Tyhesia Adger is a 33 y.o. female presenting for evaluation after cholecystectomy.  Patient states 4 days ago she had cholecystectomy, was discharged the same day.  Since then, she has been having persistent nausea, tiredness, right-sided abdominal pain, and intermittent hot/cold flashes.  She states symptoms are persistent, and she called her surgeon who recommended she come to the ER.  She also reports intermittent palpitations.  She denies chest pain, shortness of breath, vomiting, urinary symptoms, abnormal bowel movements.  She has not taken anything for her symptoms, in fact felt the medication she was given with the cause of her symptoms.  She is concerned about a possible infection.  Patient had a hysterectomy 2 months ago, did not have any postsurgical issues.     HPI  Past Medical History:  Diagnosis Date   Anxiety    paniac   Asthma    Blood dyscrasia    pt states hemorrhaged with last period 12/14 and was hospitalized in [place]   Complication of anesthesia    Constipation    Endometriosis    Fibroid    Gastritis    GERD (gastroesophageal reflux disease)    Headache(784.0)    migraines, denies    History of bleeding disorder as a child    reports no reason has been found through testing as an adult.    PONV (postoperative nausea and vomiting)    Reflux    UTI (lower urinary tract infection)     Patient Active Problem List   Diagnosis Date Noted   Heavy menses 03/30/2018   History of endometriosis 01/29/2016   DUB (dysfunctional uterine bleeding) 07/06/2015   Breast pain, left 07/06/2015   Slow transit constipation 09/27/2014   Family history of diabetes mellitus 03/03/2014   Tinea 03/03/2014   Palpitations  10/22/2013   Family history of premature CAD 10/22/2013   Chronic pelvic pain in female 06/01/2013   Fibrocystic breast changes of both breasts 04/20/2013   Endometriosis of pelvis 03/02/2013   Ectropion of cervix 05/14/2012    Past Surgical History:  Procedure Laterality Date   CHOLECYSTECTOMY N/A 11/12/2018   Procedure: LAPAROSCOPIC CHOLECYSTECTOMY;  Surgeon: Coralie Keens, MD;  Location: [facility];  Service: General;  Laterality: N/A;   DILATION AND CURETTAGE OF UTERUS     DILATION AND CURETTAGE OF UTERUS N/A 03/03/2013   Procedure: DILATATION AND CURETTAGE;  Surgeon: Emily Filbert, MD;  Location: [facility];  Service: Gynecology;  Laterality: N/A;   INDUCED ABORTION     at 5 months fetal anomalies hemorrhaged afterwards   LAPAROSCOPIC LYSIS OF ADHESIONS N/A 03/03/2013   Procedure: LAPAROSCOPIC LYSIS OF ADHESIONS;  Surgeon: Emily Filbert, MD;  Location: [facility];  Service: Gynecology;  Laterality: N/A;   LAPAROSCOPY N/A 03/03/2013   Procedure: LAPAROSCOPY DIAGNOSTIC;  Surgeon: Emily Filbert, MD;  Location: [facility];  Service: Gynecology;  Laterality: N/A;   MASS EXCISION Left 10/27/2013   Procedure: EXCISION  LEFT BREAST MASS;  Surgeon: Stark Klein, MD;  Location: [facility];  Service: General;  Laterality: Left;   ROBOTIC ASSISTED LAP VAGINAL HYSTERECTOMY  08/2018   doe in [place] -bilateral salpingecomym      OB History    Gravida  2   Para  1  Term      Preterm  1   AB  1   Living  0     SAB  1   TAB      Ectopic      Multiple      Live Births               Home Medications    Prior to Admission medications   Medication Sig Start Date End Date Taking? Authorizing Provider  acetaminophen (TYLENOL) 325 MG tablet Take 650 mg by mouth every 6 (six) hours as needed for moderate pain or headache.   Yes [provider]  Multiple Vitamin (MULTIVITAMIN WITH MINERALS) TABS tablet Take 1 tablet by mouth daily with lunch.    [provider]  traMADol (ULTRAM) 50 MG tablet Take 1 tablet (50 mg total) by mouth every 6 (six) hours as needed for moderate pain. Patient not taking: Reported on 11/15/2018 11/12/18   Coralie Keens, MD    Family History Family History  Problem Relation Age of Onset   Hypertension Mother    Heart disease Mother    Stroke Mother    Hyperlipidemia Father    Hypothyroidism Other        several fam members    Alcohol abuse Neg Hx    Arthritis Neg Hx    Asthma Neg Hx    Birth defects Neg Hx    Cancer Neg Hx    Depression Neg Hx    COPD Neg Hx    Diabetes Neg Hx    Drug abuse Neg Hx    Early death Neg Hx    Hearing loss Neg Hx    Kidney disease Neg Hx    Learning disabilities Neg Hx    Mental illness Neg Hx    Mental retardation Neg Hx    Miscarriages / Stillbirths Neg Hx    Vision loss Neg Hx    Colon cancer Neg Hx     Social History Social History   Tobacco Use   Smoking status: Never Smoker   Smokeless tobacco: Never Used  Substance Use Topics   Alcohol use: No    Alcohol/week: 0.0 standard drinks   Drug use: No     Allergies   Dilaudid [hydromorphone hcl], Morphine and related, Other, and Latex   Review of Systems Review of Systems  Constitutional: Positive for fatigue.  Cardiovascular: Positive for palpitations.  Gastrointestinal: Positive for abdominal pain and nausea.  All other systems reviewed and are negative.    Physical Exam Updated Vital Signs BP 127/79    Pulse 76    Temp 98.9 F (37.2 C) (Oral)    Resp 18    LMP 08/02/2018 (Exact Date)    SpO2 98%   Physical Exam Vitals signs and nursing note reviewed.  Constitutional:      General: She is not in acute distress.    Appearance: She is well-developed.     Comments: Laying comfortably in the bed in no acute distress  HENT:     Head: Normocephalic and atraumatic.  Eyes:     Conjunctiva/sclera: Conjunctivae normal.     Pupils: Pupils are equal, round, and  reactive to light.  Neck:     Musculoskeletal: Normal range of motion and neck supple.  Cardiovascular:     Rate and Rhythm: Regular rhythm. Tachycardia present.     Pulses: Normal pulses.     Comments: Mildly tachycardic around 105 Pulmonary:     Effort:  Pulmonary effort is normal. No respiratory distress.     Breath sounds: Normal breath sounds. No wheezing.     Comments: Speaking in full sentences.  Clear lung sounds in all fields. Abdominal:     General: There is no distension.     Palpations: Abdomen is soft. There is no mass.     Tenderness: There is abdominal tenderness. There is no guarding or rebound.     Comments: Well-healing surgical incisions without erythema, warmth, induration, or drainage. Tenderness palpation of the right upper quadrant, as expected.  No tenderness palpation elsewhere in the abdomen.  No rigidity, guarding, distention.  Negative rebound.  Musculoskeletal: Normal range of motion.  Skin:    General: Skin is warm and dry.     Capillary Refill: Capillary refill takes less than 2 seconds.  Neurological:     Mental Status: She is alert and oriented to person, place, and time.      ED Treatments / Results  Labs (all labs ordered are listed, but only abnormal results are displayed) Labs Reviewed  COMPREHENSIVE METABOLIC PANEL - Abnormal; Notable for the following components:      Result Value   Sodium 134 (*)    Glucose, Bld 106 (*)    All other components within normal limits  AMMONIA - Abnormal; Notable for the following components:   Ammonia 36 (*)    All other components within normal limits  URINALYSIS, ROUTINE W REFLEX MICROSCOPIC - Abnormal; Notable for the following components:   Color, Urine STRAW (*)    Specific Gravity, Urine 1.004 (*)    All other components within normal limits  CBC WITH DIFFERENTIAL/PLATELET  LIPASE, BLOOD    EKG None  Radiology Ct Angio Chest Pe W And/or Wo Contrast  Result Date: 11/16/2018 CLINICAL DATA:   Dizziness and nausea falling gallbladder surgery EXAM: CT ANGIOGRAPHY CHEST and CT abdomen and pelvis WITH CONTRAST TECHNIQUE: Multidetector CT imaging of the chest, abdomen, pelvis was performed using the standard protocol during bolus administration of intravenous contrast. Multiplanar CT image reconstructions and MIPs were obtained to evaluate the vascular anatomy. CONTRAST:  190mL OMNIPAQUE IOHEXOL 350 MG/ML SOLN COMPARISON:  None. FINDINGS: Cardiovascular: There is a optimal opacification of the pulmonary arteries. There is no central,segmental, or subsegmental filling defects within the pulmonary arteries. The heart is normal in size. No pericardial effusion thickening. No evidence right heart strain. There is normal three-vessel brachiocephalic anatomy without proximal stenosis. The thoracic aorta is normal in appearance. Mediastinum/Nodes: No hilar, mediastinal, or axillary adenopathy. Thyroid gland, trachea, and esophagus demonstrate no significant findings. Lungs/Pleura: The lungs are clear. No pleural effusion or pneumothorax. No airspace consolidation. Upper Abdomen: No acute abnormalities present in the visualized portions of the upper abdomen. Musculoskeletal: No chest wall abnormality. No acute or significant osseous findings. Review of the MIP images confirms the above findings. Lower chest: The visualized heart size within normal limits. No pericardial fluid/thickening. No hiatal hernia. The visualized portions of the lungs are clear. Hepatobiliary: The liver is normal in density without focal abnormality.The main portal vein is patent. The patient is status post cholecystectomy. No biliary ductal dilation. Surgical clips seen within the gallbladder bed. There is a small amount of hypodense fluid seen within the gallbladder bed. No intra or extrahepatic there is biliary ductal dilatation is seen. Pancreas: Unremarkable. No pancreatic ductal dilatation or surrounding inflammatory changes. Spleen:  Normal in size without focal abnormality. Adrenals/Urinary Tract: Both adrenal glands appear normal. The kidneys and collecting system appear normal  without evidence of urinary tract calculus or hydronephrosis. Bladder is unremarkable. Stomach/Bowel: The stomach, small bowel, and colon are normal in appearance. There is a moderate to large amount of colonic stool. No inflammatory changes, wall thickening, or obstructive findings. Vascular/Lymphatic: There are no enlarged mesenteric, retroperitoneal, or pelvic lymph nodes. No significant vascular findings are present. Reproductive: The uterus and adnexa are unremarkable. Other: No evidence of abdominal wall mass or hernia. Small amount of free fluid is seen within the cul-de-sac. Musculoskeletal: No acute or significant osseous findings. IMPRESSION: 1. No central, segmental, or subsegmental pulmonary embolism. 2. No acute intrathoracic pathology. 3. Status post cholecystectomy with a small amount of fluid seen within the gallbladder bed which could be due to postoperative fluid/seroma. 4. No intra or extrahepatic biliary ductal dilatation. 5. Moderate to large amount of colonic stool without evidence of obstruction. Electronically Signed   By: Prudencio Pair M.D.   On: 11/16/2018 00:44   Ct Abdomen Pelvis W Contrast  Result Date: 11/16/2018 CLINICAL DATA:  Dizziness and nausea falling gallbladder surgery EXAM: CT ANGIOGRAPHY CHEST and CT abdomen and pelvis WITH CONTRAST TECHNIQUE: Multidetector CT imaging of the chest, abdomen, pelvis was performed using the standard protocol during bolus administration of intravenous contrast. Multiplanar CT image reconstructions and MIPs were obtained to evaluate the vascular anatomy. CONTRAST:  158mL OMNIPAQUE IOHEXOL 350 MG/ML SOLN COMPARISON:  None. FINDINGS: Cardiovascular: There is a optimal opacification of the pulmonary arteries. There is no central,segmental, or subsegmental filling defects within the pulmonary arteries.  The heart is normal in size. No pericardial effusion thickening. No evidence right heart strain. There is normal three-vessel brachiocephalic anatomy without proximal stenosis. The thoracic aorta is normal in appearance. Mediastinum/Nodes: No hilar, mediastinal, or axillary adenopathy. Thyroid gland, trachea, and esophagus demonstrate no significant findings. Lungs/Pleura: The lungs are clear. No pleural effusion or pneumothorax. No airspace consolidation. Upper Abdomen: No acute abnormalities present in the visualized portions of the upper abdomen. Musculoskeletal: No chest wall abnormality. No acute or significant osseous findings. Review of the MIP images confirms the above findings. Lower chest: The visualized heart size within normal limits. No pericardial fluid/thickening. No hiatal hernia. The visualized portions of the lungs are clear. Hepatobiliary: The liver is normal in density without focal abnormality.The main portal vein is patent. The patient is status post cholecystectomy. No biliary ductal dilation. Surgical clips seen within the gallbladder bed. There is a small amount of hypodense fluid seen within the gallbladder bed. No intra or extrahepatic there is biliary ductal dilatation is seen. Pancreas: Unremarkable. No pancreatic ductal dilatation or surrounding inflammatory changes. Spleen: Normal in size without focal abnormality. Adrenals/Urinary Tract: Both adrenal glands appear normal. The kidneys and collecting system appear normal without evidence of urinary tract calculus or hydronephrosis. Bladder is unremarkable. Stomach/Bowel: The stomach, small bowel, and colon are normal in appearance. There is a moderate to large amount of colonic stool. No inflammatory changes, wall thickening, or obstructive findings. Vascular/Lymphatic: There are no enlarged mesenteric, retroperitoneal, or pelvic lymph nodes. No significant vascular findings are present. Reproductive: The uterus and adnexa are  unremarkable. Other: No evidence of abdominal wall mass or hernia. Small amount of free fluid is seen within the cul-de-sac. Musculoskeletal: No acute or significant osseous findings. IMPRESSION: 1. No central, segmental, or subsegmental pulmonary embolism. 2. No acute intrathoracic pathology. 3. Status post cholecystectomy with a small amount of fluid seen within the gallbladder bed which could be due to postoperative fluid/seroma. 4. No intra or extrahepatic biliary ductal dilatation. 5.  Moderate to large amount of colonic stool without evidence of obstruction. Electronically Signed   By: Prudencio Pair M.D.   On: 11/16/2018 00:44    Procedures Procedures (including critical care time)  Medications Ordered in ED Medications  sodium chloride (PF) 0.9 % injection (0 mLs  Hold 11/16/18 0006)  sodium chloride 0.9 % bolus 1,000 mL (0 mLs Intravenous Stopped 11/16/18 0006)  iohexol (OMNIPAQUE) 350 MG/ML injection 100 mL (100 mLs Intravenous Contrast Given 11/16/18 0010)     Initial Impression / Assessment and Plan / ED Course  I have reviewed the triage vital signs and the nursing notes.  Pertinent labs & imaging results that were available during my care of the patient were reviewed by me and considered in my medical decision making (see chart for details).   Patient presenting for evaluation after cholecystectomy several days ago.  Physical exam reassuring, she appears nontoxic.  However, as patient has continued tiredness, pain, nausea with hot and cold flashes, consider postop infection, although less likely.  Consider bile leak.  As patient is having intermittent palpitations and is tachycardic, consider PE.  Will obtain labs, CT abdomen pelvis, and CTA to rule out PE.  Fluids for tachycardia.  Offered medication for nausea, patient declined.  Labs reassuring.  No leukocytosis.  Liver, bili, lipase normal.  Ammonia normal.  CTA negative for PE.  CT abdomen pelvis shows postsurgical changes.  Will  consult with general surgery, but patient can likely be discharged.  Discussed with Dr. Georgette Dover from general surgery who recommended discharge and follow-up in the office as needed.  Discussed findings with patient.  Discussed follow-up as needed.  At this time, patient appears safe for discharge.  Return precautions given.  Patient states she understands and agrees to plan.   Final Clinical Impressions(s) / ED Diagnoses   Final diagnoses:  Post-operative pain  Fatigue, unspecified type    ED Discharge Orders    None       Franchot Heidelberg, PA-C 11/16/18 [id number]    Tegeler, Gwenyth Allegra, MD 11/16/18 [id number]

## 2019-03-16 ENCOUNTER — Ambulatory Visit: Payer: BC Managed Care – PPO | Admitting: Gastroenterology

## 2019-04-26 ENCOUNTER — Emergency Department (HOSPITAL_COMMUNITY)
Admission: EM | Admit: 2019-04-26 | Discharge: 2019-04-26 | Disposition: A | Discharge status: Home / Self Care | Payer: BC Managed Care – PPO | Attending: Emergency Medicine | Admitting: Emergency Medicine

## 2019-04-26 ENCOUNTER — Other Ambulatory Visit: Payer: Self-pay

## 2019-04-26 ENCOUNTER — Emergency Department (HOSPITAL_COMMUNITY): Payer: BC Managed Care – PPO

## 2019-04-26 DIAGNOSIS — Z91040 Latex allergy status: Secondary | ICD-10-CM | POA: Insufficient documentation

## 2019-04-26 DIAGNOSIS — J45909 Unspecified asthma, uncomplicated: Secondary | ICD-10-CM | POA: Insufficient documentation

## 2019-04-26 DIAGNOSIS — R079 Chest pain, unspecified: Secondary | ICD-10-CM | POA: Diagnosis not present

## 2019-04-26 DIAGNOSIS — R002 Palpitations: Secondary | ICD-10-CM | POA: Diagnosis not present

## 2019-04-26 DIAGNOSIS — R072 Precordial pain: Secondary | ICD-10-CM | POA: Diagnosis not present

## 2019-04-26 DIAGNOSIS — Z79899 Other long term (current) drug therapy: Secondary | ICD-10-CM | POA: Insufficient documentation

## 2019-04-26 DIAGNOSIS — R0602 Shortness of breath: Secondary | ICD-10-CM | POA: Diagnosis not present

## 2019-04-26 LAB — CBC
HCT: 43.5 % (ref 36.0–46.0)
Hemoglobin: 14.5 g/dL (ref 12.0–15.0)
MCH: 31.3 pg (ref 26.0–34.0)
MCHC: 33.3 g/dL (ref 30.0–36.0)
MCV: 94 fL (ref 80.0–100.0)
Platelets: 298 10*3/uL (ref 150–400)
RBC: 4.63 MIL/uL (ref 3.87–5.11)
RDW: 11.9 % (ref 11.5–15.5)
WBC: 7 10*3/uL (ref 4.0–10.5)
nRBC: 0 % (ref 0.0–0.2)

## 2019-04-26 LAB — COMPREHENSIVE METABOLIC PANEL
ALT: 17 U/L (ref 0–44)
AST: 17 U/L (ref 15–41)
Albumin: 4.6 g/dL (ref 3.5–5.0)
Alkaline Phosphatase: 75 U/L (ref 38–126)
Anion gap: 11 (ref 5–15)
BUN: 10 mg/dL (ref 6–20)
CO2: 24 mmol/L (ref 22–32)
Calcium: 8.9 mg/dL (ref 8.9–10.3)
Chloride: 104 mmol/L (ref 98–111)
Creatinine, Ser: 0.57 mg/dL (ref 0.44–1.00)
GFR calc Af Amer: >60 mL/min (ref >60)
GFR calc non Af Amer: >60 mL/min (ref >60)
Glucose, Bld: 100 mg/dL — ABNORMAL HIGH (ref 70–99)
Potassium: 3.7 mmol/L (ref 3.5–5.1)
Sodium: 139 mmol/L (ref 135–145)
Total Bilirubin: 1 mg/dL (ref 0.3–1.2)
Total Protein: 7.7 g/dL (ref 6.5–8.1)

## 2019-04-26 LAB — TROPONIN I (HIGH SENSITIVITY): Troponin I (High Sensitivity): <2 ng/L (ref <18)

## 2019-04-26 LAB — TSH: TSH: 1.454 u[IU]/mL (ref 0.350–4.500)

## 2019-04-26 LAB — T4, FREE: Free T4: 1.01 ng/dL (ref 0.61–1.12)

## 2019-04-26 MED ORDER — ACETAMINOPHEN 500 MG PO TABS
1000.0000 mg | ORAL_TABLET | Freq: Once | ORAL | Status: DC
  Filled 2019-04-26: qty 2

## 2019-04-26 MED ORDER — FAMOTIDINE 20 MG PO TABS
20.0000 mg | ORAL_TABLET | Freq: Once | ORAL | Status: AC
  Administered 2019-04-26: 20 mg via ORAL
  Filled 2019-04-26: qty 1

## 2019-04-26 MED ORDER — ALUM & MAG HYDROXIDE-SIMETH 200-200-20 MG/5ML PO SUSP
30.0000 mL | Freq: Once | ORAL | Status: AC
  Administered 2019-04-26: 12:00:00 30 mL via ORAL
  Filled 2019-04-26: qty 30

## 2019-04-26 NOTE — ED Triage Notes (Signed)
Patient reports she is having flu like symptoms. Patient reports SOB, body shakes, and malaise.

## 2019-04-26 NOTE — ED Notes (Signed)
An After Visit Summary was printed and given to the patient. Discharge instructions given and no further questions at this time. Pt denies SOB and chest pain at this time.

## 2019-04-26 NOTE — Discharge Instructions (Signed)
It was our pleasure to provide your ER care today - we hope that you feel better.  Overall, your lab and imaging tests look good, or normal.  See attached information as relates palpitations and chest pain.  You may try acetaminophen or ibuprofen as need.   If gi/reflux symptoms, you may try pepcid or maalox.   Follow up with cardiologist in the next 1-2 weeks - call office to arrange appointment.  Return to ER if worsening or new symptoms, high fevers, increased trouble breathing, persistent fast heart beat, fainting, severe chest pain, or other concern.

## 2019-04-26 NOTE — ED Provider Notes (Signed)
[facility] DEPT Provider Note   CSN: [id number] Arrival date & time: 04/26/19  1035     History Chief Complaint  Patient presents with  . Shortness of Breath    Monica Phelps is a 34 y.o. female.  Patient c/o mid chest pain, and palpitations for the past week. Symptoms acute onset, at rest, constant, non radiating. States feels dull pain, at time a feeling of being 'hot' in chest, with palpitations. Symptoms occur at rest, not related to positional change, breathing, activity or exertion. No associated syncope/dizziness, nv or diaphoresis. At times does feel sob. Denies hx heart disease or dysrhythmia. No fam hx premature cad. No leg pain or swelling. No recent surgery, trauma, travel or immobility. No hx dvt or pe. No cough or sore throat. No fevers. No known covid exposure. ?hx gerd.  The history is provided by the patient.  Shortness of Breath Associated symptoms: chest pain   Associated symptoms: no abdominal pain, no cough, no fever, no headaches, no neck pain, no rash, no sore throat and no vomiting        Past Medical History:  Diagnosis Date  . Anxiety    paniac  . Asthma   . Blood dyscrasia    pt states hemorrhaged with last period 12/14 and was hospitalized in [place]  . Complication of anesthesia   . Constipation   . Endometriosis   . Fibroid   . Gastritis   . GERD (gastroesophageal reflux disease)   . Headache(784.0)    migraines, denies   . History of bleeding disorder as a child    reports no reason has been found through testing as an adult.   Marland Kitchen PONV (postoperative nausea and vomiting)   . Reflux   . UTI (lower urinary tract infection)     Patient Active Problem List   Diagnosis Date Noted  . Heavy menses 03/30/2018  . History of endometriosis 01/29/2016  . DUB (dysfunctional uterine bleeding) 07/06/2015  . Breast pain, left 07/06/2015  . Slow transit constipation 09/27/2014  . Family history of diabetes  mellitus 03/03/2014  . Tinea 03/03/2014  . Palpitations 10/22/2013  . Family history of premature CAD 10/22/2013  . Chronic pelvic pain in female 06/01/2013  . Fibrocystic breast changes of both breasts 04/20/2013  . Endometriosis of pelvis 03/02/2013  . Ectropion of cervix 05/14/2012    Past Surgical History:  Procedure Laterality Date  . CHOLECYSTECTOMY N/A 11/12/2018   Procedure: LAPAROSCOPIC CHOLECYSTECTOMY;  Surgeon: Coralie Keens, MD;  Location: [facility];  Service: General;  Laterality: N/A;  . DILATION AND CURETTAGE OF UTERUS    . DILATION AND CURETTAGE OF UTERUS N/A 03/03/2013   Procedure: DILATATION AND CURETTAGE;  Surgeon: Emily Filbert, MD;  Location: [facility];  Service: Gynecology;  Laterality: N/A;  . INDUCED ABORTION     at 5 months fetal anomalies hemorrhaged afterwards  . LAPAROSCOPIC LYSIS OF ADHESIONS N/A 03/03/2013   Procedure: LAPAROSCOPIC LYSIS OF ADHESIONS;  Surgeon: Emily Filbert, MD;  Location: [facility];  Service: Gynecology;  Laterality: N/A;  . LAPAROSCOPY N/A 03/03/2013   Procedure: LAPAROSCOPY DIAGNOSTIC;  Surgeon: Emily Filbert, MD;  Location: [facility];  Service: Gynecology;  Laterality: N/A;  . MASS EXCISION Left 10/27/2013   Procedure: EXCISION  LEFT BREAST MASS;  Surgeon: Stark Klein, MD;  Location: [facility];  Service: General;  Laterality: Left;  . ROBOTIC ASSISTED LAP VAGINAL HYSTERECTOMY  08/2018   [place]e in [place] -bilateral salpingecomym  OB History    Gravida  2   Para  1   Term      Preterm  1   AB  1   Living  0     SAB  1   TAB      Ectopic      Multiple      Live Births              Family History  Problem Relation Age of Onset  . Hypertension Mother   . Heart disease Mother   . Stroke Mother   . Hyperlipidemia Father   . Hypothyroidism Other        several fam members   . Alcohol abuse Neg Hx   . Arthritis Neg Hx   . Asthma Neg Hx   . Birth defects Neg Hx   . Cancer Neg Hx   . Depression Neg Hx     . COPD Neg Hx   . Diabetes Neg Hx   . Drug abuse Neg Hx   . Early death Neg Hx   . Hearing loss Neg Hx   . Kidney disease Neg Hx   . Learning disabilities Neg Hx   . Mental illness Neg Hx   . Mental retardation Neg Hx   . Miscarriages / Stillbirths Neg Hx   . Vision loss Neg Hx   . Colon cancer Neg Hx     Social History   Tobacco Use  . Smoking status: Never Smoker  . Smokeless tobacco: Never Used  Substance Use Topics  . Alcohol use: No    Alcohol/week: 0.0 standard drinks  . Drug use: No    Home Medications Prior to Admission medications   Medication Sig Start Date End Date Taking? Authorizing Provider  acetaminophen (TYLENOL) 325 MG tablet Take 650 mg by mouth every 6 (six) hours as needed for moderate pain or headache.    [provider]  Multiple Vitamin (MULTIVITAMIN WITH MINERALS) TABS tablet Take 1 tablet by mouth daily with lunch.    [provider]  traMADol (ULTRAM) 50 MG tablet Take 1 tablet (50 mg total) by mouth every 6 (six) hours as needed for moderate pain. Patient not taking: Reported on 11/15/2018 11/12/18   Coralie Keens, MD    Allergies    Dilaudid [hydromorphone hcl], Morphine and related, Other, and Latex  Review of Systems   Review of Systems  Constitutional: Negative for fever.  HENT: Negative for sore throat.   Eyes: Negative for redness.  Respiratory: Positive for shortness of breath. Negative for cough.   Cardiovascular: Positive for chest pain and palpitations. Negative for leg swelling.  Gastrointestinal: Negative for abdominal pain, nausea and vomiting.  Genitourinary: Negative for flank pain.  Musculoskeletal: Negative for back pain and neck pain.  Skin: Negative for rash.  Neurological: Negative for syncope, light-headedness and headaches.  Hematological: Does not bruise/bleed easily.  Psychiatric/Behavioral: Negative for confusion.    Physical Exam Updated Vital Signs BP (!) 141/86 (BP Location: Left Arm)    Pulse 92   Temp 98.4 F (36.9 C) (Oral)   Resp 18   LMP 08/02/2018 (Exact Date)   SpO2 99%   Physical Exam Vitals and nursing note reviewed.  Constitutional:      Appearance: Normal appearance. She is well-developed.  HENT:     Head: Atraumatic.     Nose: Nose normal.     Mouth/Throat:     Mouth: Mucous membranes are moist.  Eyes:  General: No scleral icterus.    Conjunctiva/sclera: Conjunctivae normal.     Pupils: Pupils are equal, round, and reactive to light.  Neck:     Trachea: No tracheal deviation.  Cardiovascular:     Rate and Rhythm: Normal rate and regular rhythm.     Pulses: Normal pulses.     Heart sounds: Normal heart sounds. No murmur. No friction rub. No gallop.   Pulmonary:     Effort: Pulmonary effort is normal. No respiratory distress.     Breath sounds: Normal breath sounds.  Chest:     Chest wall: No tenderness.  Abdominal:     General: Bowel sounds are normal. There is no distension.     Palpations: Abdomen is soft.     Tenderness: There is no abdominal tenderness. There is no guarding.  Genitourinary:    Comments: No cva tenderness.  Musculoskeletal:        General: No swelling or tenderness.     Cervical back: Normal range of motion and neck supple. No rigidity. No muscular tenderness.     Right lower leg: No edema.     Left lower leg: No edema.  Skin:    General: Skin is warm and dry.     Findings: No rash.  Neurological:     Mental Status: She is alert.     Comments: Alert, speech normal.   Psychiatric:        Mood and Affect: Mood normal.     ED Results / Procedures / Treatments   Labs (all labs ordered are listed, but only abnormal results are displayed) Results for orders placed or performed during the hospital encounter of 04/26/19  Comprehensive metabolic panel  Result Value Ref Range   Sodium 139 135 - 145 mmol/L   Potassium 3.7 3.5 - 5.1 mmol/L   Chloride 104 98 - 111 mmol/L   CO2 24 22 - 32 mmol/L   Glucose, Bld 100  (H) 70 - 99 mg/dL   BUN 10 6 - 20 mg/dL   Creatinine, Ser 0.57 0.44 - 1.00 mg/dL   Calcium 8.9 8.9 - 10.3 mg/dL   Total Protein 7.7 6.5 - 8.1 g/dL   Albumin 4.6 3.5 - 5.0 g/dL   AST 17 15 - 41 U/L   ALT 17 0 - 44 U/L   Alkaline Phosphatase 75 38 - 126 U/L   Total Bilirubin 1.0 0.3 - 1.2 mg/dL   GFR calc non Af Amer >60 >60 mL/min   GFR calc Af Amer >60 >60 mL/min   Anion gap 11 5 - 15  CBC  Result Value Ref Range   WBC 7.0 4.0 - 10.5 K/uL   RBC 4.63 3.87 - 5.11 MIL/uL   Hemoglobin 14.5 12.0 - 15.0 g/dL   HCT 43.5 36.0 - 46.0 %   MCV 94.0 80.0 - 100.0 fL   MCH 31.3 26.0 - 34.0 pg   MCHC 33.3 30.0 - 36.0 g/dL   RDW 11.9 11.5 - 15.5 %   Platelets 298 150 - 400 K/uL   nRBC 0.0 0.0 - 0.2 %  TSH  Result Value Ref Range   TSH 1.454 0.350 - 4.500 uIU/mL  Troponin I (High Sensitivity)  Result Value Ref Range   Troponin I (High Sensitivity) <2 <18 ng/L   DG Chest Port 1 View  Result Date: 04/26/2019 CLINICAL DATA:  Shortness of breath and chest pain EXAM: PORTABLE CHEST 1 VIEW COMPARISON:  January 08, 2017 FINDINGS: Lungs are clear. Heart size  and pulmonary vascularity are normal. No adenopathy. No pneumothorax. No bone lesions. IMPRESSION: No abnormality noted. Electronically Signed   By: Lowella Grip III M.D.   On: 04/26/2019 11:20    ED ECG REPORT   Date: 04/26/2019  Rate: 86  Rhythm: normal sinus rhythm  QRS Axis: normal  Intervals: normal  ST/T Wave abnormalities: nonspecific T wave changes  Conduction Disutrbances:none  Narrative Interpretation:   Old EKG Reviewed: unchanged  I have personally reviewed the EKG tracing   Radiology DG Chest Port 1 View  Result Date: 04/26/2019 CLINICAL DATA:  Shortness of breath and chest pain EXAM: PORTABLE CHEST 1 VIEW COMPARISON:  January 08, 2017 FINDINGS: Lungs are clear. Heart size and pulmonary vascularity are normal. No adenopathy. No pneumothorax. No bone lesions. IMPRESSION: No abnormality noted. Electronically Signed    By: Lowella Grip III M.D.   On: 04/26/2019 11:20    Procedures Procedures (including critical care time)  Medications Ordered in ED Medications - No data to display  ED Course  I have reviewed the triage vital signs and the nursing notes.  Pertinent labs & imaging results that were available during my care of the patient were reviewed by me and considered in my medical decision making (see chart for details).    MDM Rules/Calculators/A&P                      Iv ns. Ecg. Stat labs. Cxr. Continuous pulse ox and monitor.   Reviewed nursing notes and prior charts for additional history.   Labs reviewed/interpreted by me - trop normal - after symptoms present for several days, trop normal - felt not c/w ACS.  Chem also normal. TSH normal.  CXR reviewed/interpreted by me - no pna.   Acetaminophen, pepcid, maalox for symptom relief.  Recheck, pt has remained in NSR throughout stay in ED. Vitals normal. Currently hr 82, rr 14, pulse ox 100%.   Patient currently appears stable for d/c.   RE cp, palpitations, will have f/u cardiology as outpt.   Return precautions provided.      Final Clinical Impression(s) / ED Diagnoses Final diagnoses:  None    Rx / DC Orders ED Discharge Orders    None       Lajean Saver, MD 04/26/19 1327

## 2019-05-03 DIAGNOSIS — F4320 Adjustment disorder, unspecified: Secondary | ICD-10-CM | POA: Diagnosis not present

## 2019-10-03 IMAGING — US ULTRASOUND ABDOMEN LIMITED
1 series · 14 of 25 positions shown · non-contrast
Comparison: 09/29/2015

CLINICAL DATA: RIGHT upper quadrant and epigastric pain for 2
months

EXAM:
ULTRASOUND ABDOMEN LIMITED RIGHT UPPER QUADRANT

[Series 1: ultrasound abdomen limited · 0.13mm/px · 14 of 47 slices shown]
[im 1/47]
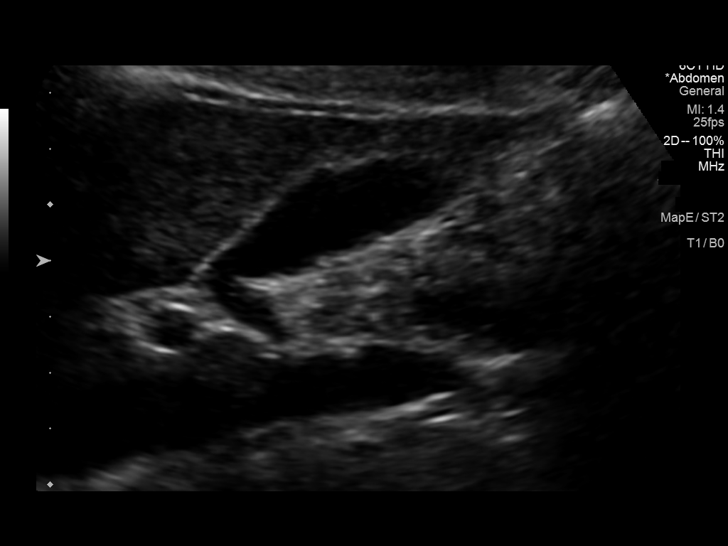
[im 4/47]
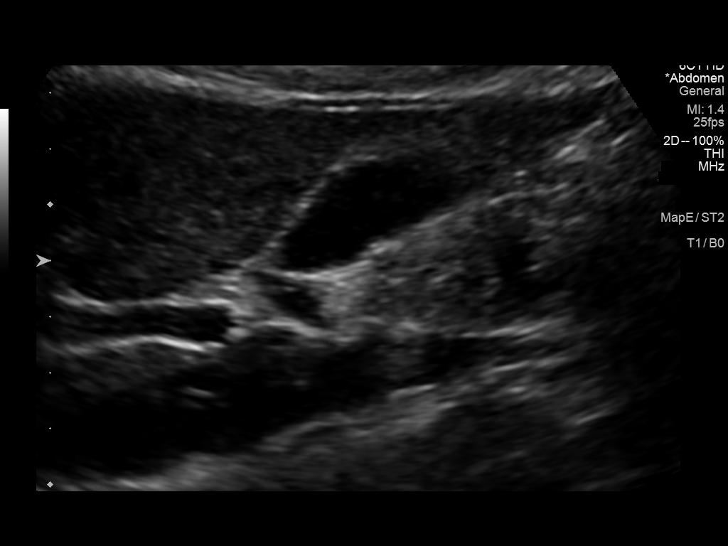
[im 8/47]
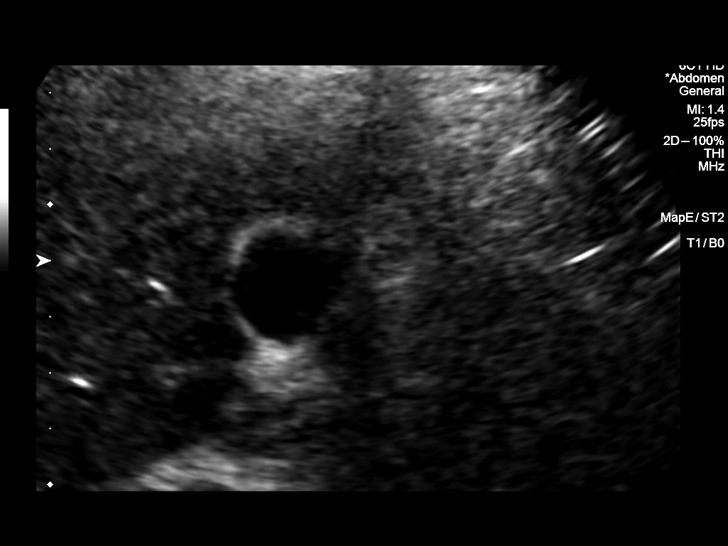
[im 12/47]
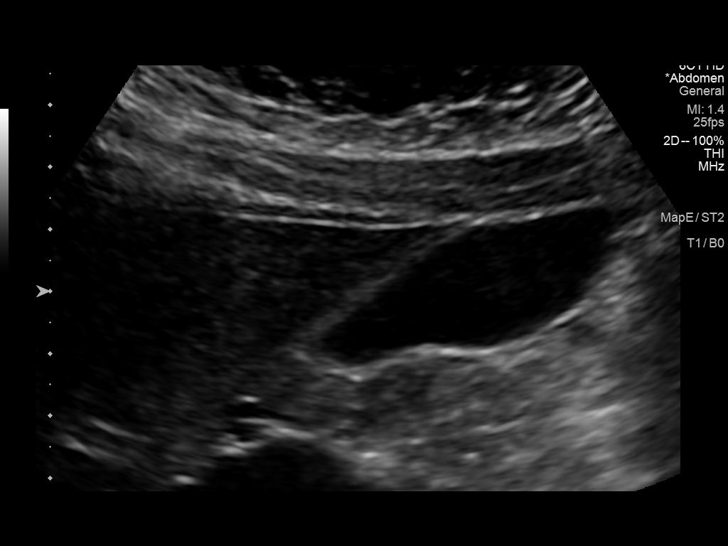
[im 16/47]
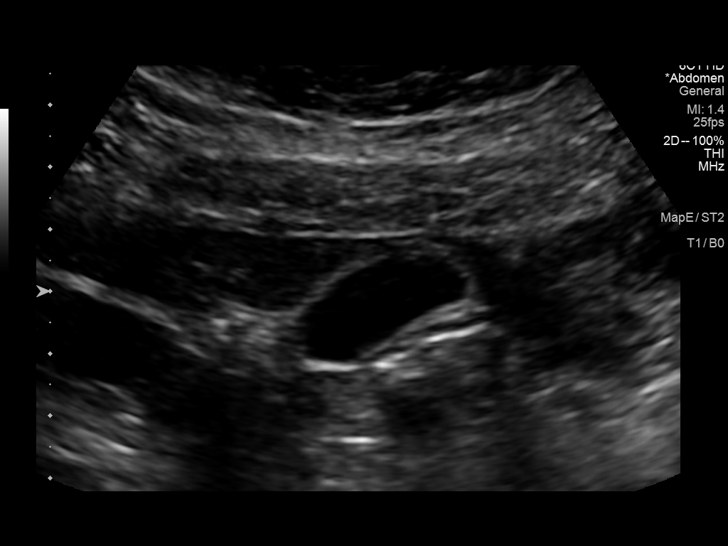
[im 18/47]
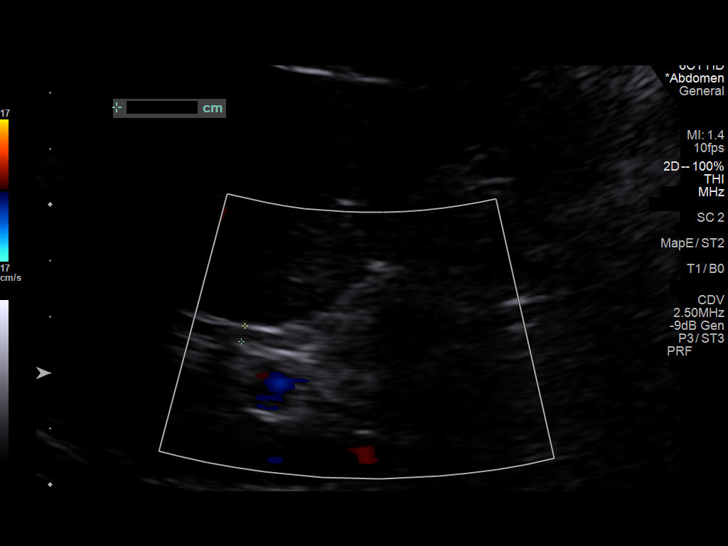
[im 22/47]
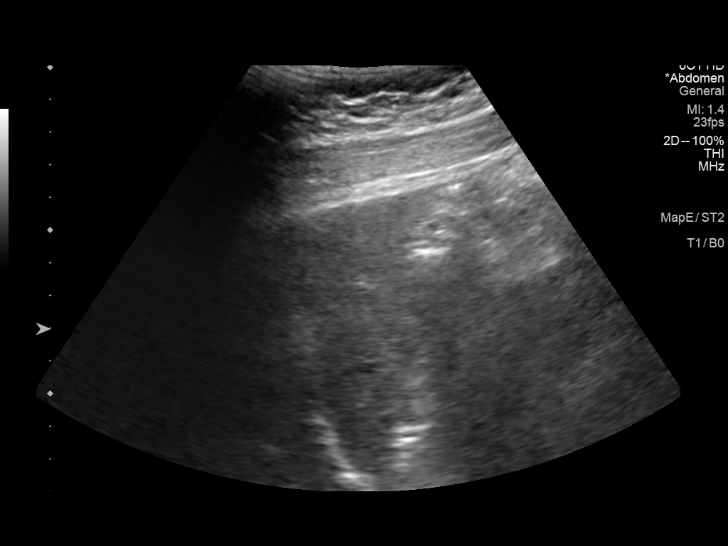
[im 25/47]
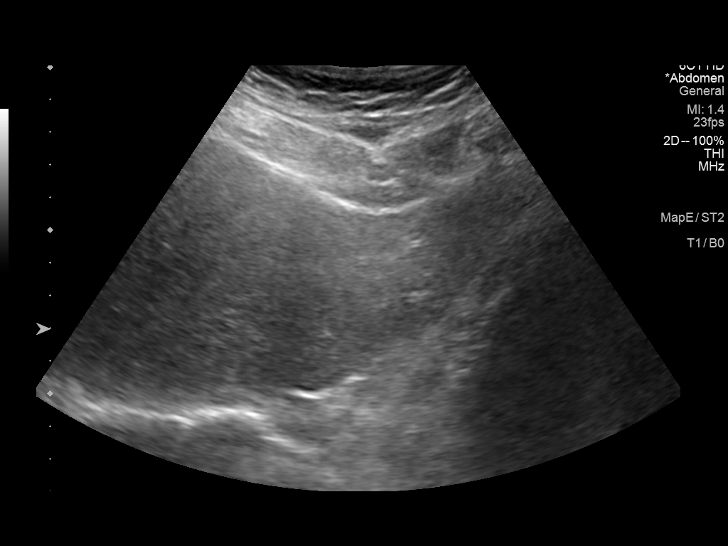
[im 29/47]
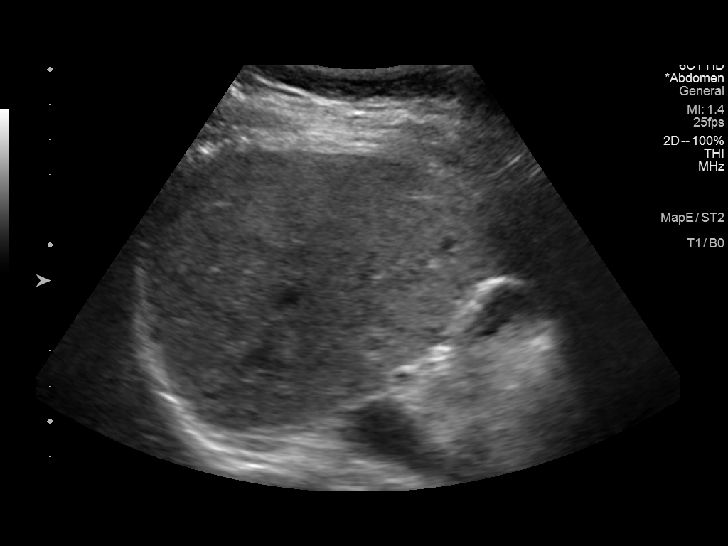
[im 31/47]
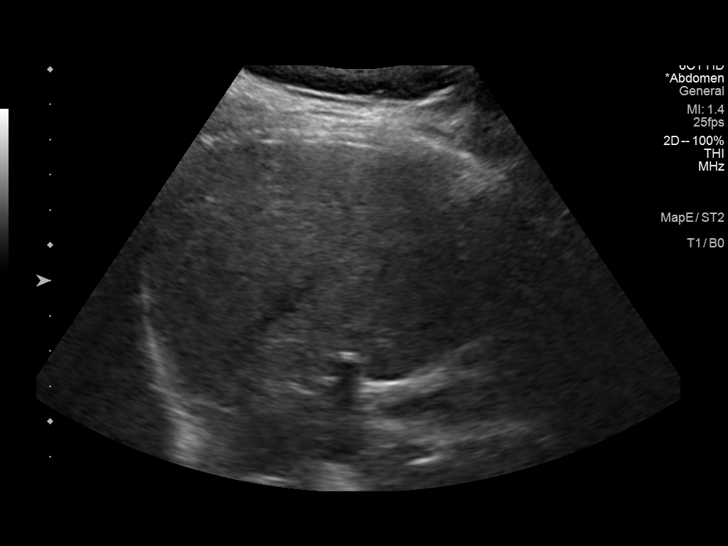
[im 35/47]
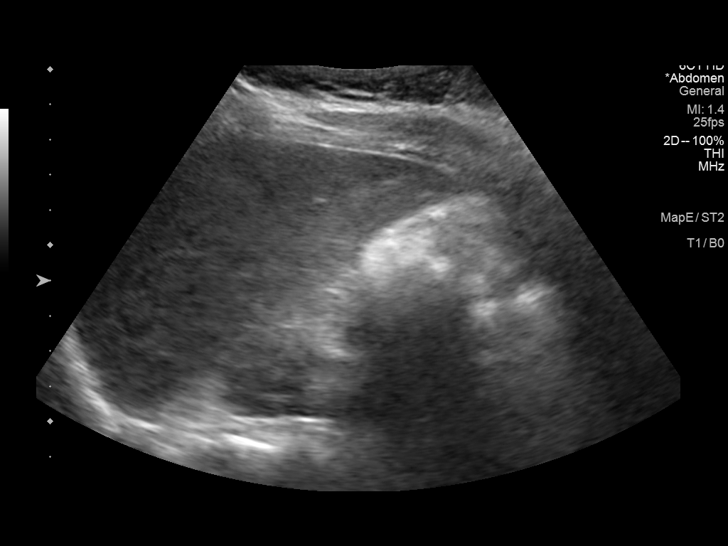
[im 39/47]
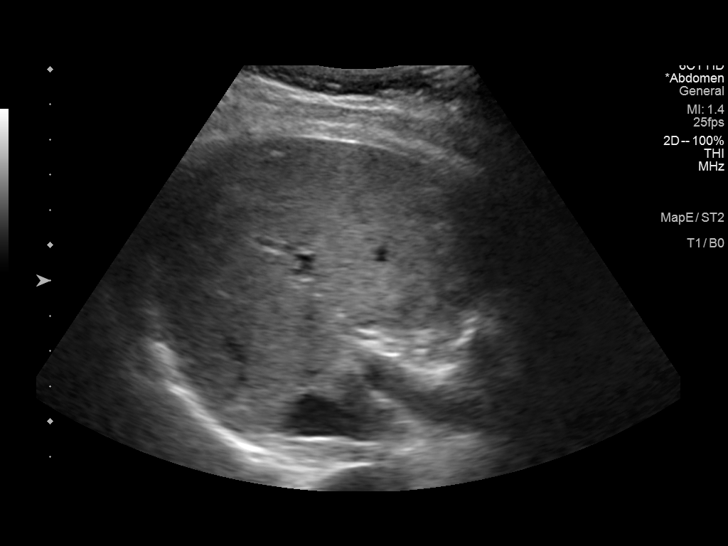
[im 43/47]
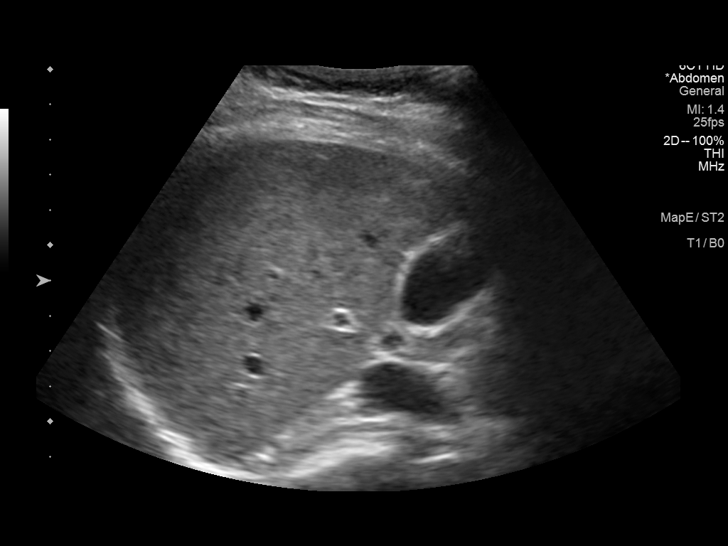
[im 47/47]
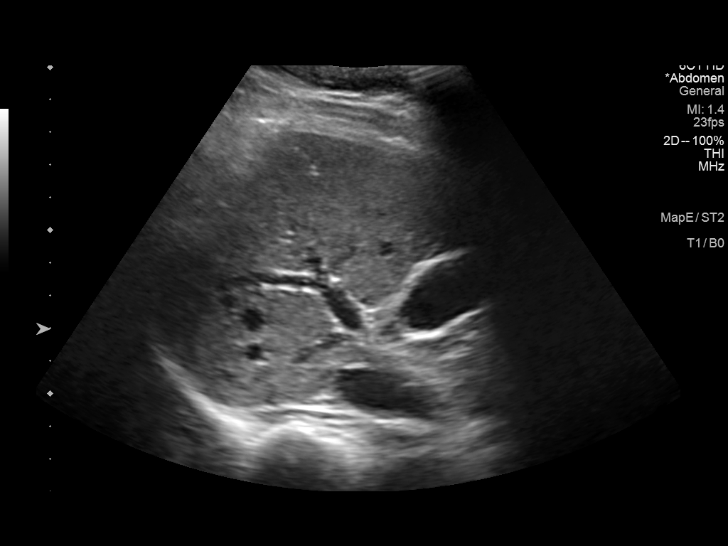

[14 of 25 positions shown; findings below may reference images not displayed]

FINDINGS: Gallbladder:

Normally distended without stones or wall thickening. No
pericholecystic fluid or sonographic Murphy sign.

Common bile duct:

Diameter: 3 mm diameter, normal

Liver:

Normal echogenicity. No mass or nodularity. Portal vein is patent on
color Doppler imaging with normal direction of blood flow towards
the liver.

No RIGHT upper quadrant free fluid.
IMPRESSION: Normal exam.
